# Patient Record
Sex: Male | Born: 1960 | Race: White | Hispanic: No | Marital: Married | Smoking: Never smoker
Health system: Southern US, Community
[De-identification: ages and names within clinical notes are randomized; demographics above are authoritative.]

## PROBLEM LIST (undated history)

## (undated) DIAGNOSIS — C9000 Multiple myeloma not having achieved remission: Secondary | ICD-10-CM

## (undated) DIAGNOSIS — E43 Unspecified severe protein-calorie malnutrition: Secondary | ICD-10-CM

## (undated) DIAGNOSIS — I2699 Other pulmonary embolism without acute cor pulmonale: Secondary | ICD-10-CM

## (undated) DIAGNOSIS — E859 Amyloidosis, unspecified: Secondary | ICD-10-CM

## (undated) DIAGNOSIS — I509 Heart failure, unspecified: Secondary | ICD-10-CM

## (undated) DIAGNOSIS — J90 Pleural effusion, not elsewhere classified: Secondary | ICD-10-CM

---

## 1998-03-19 ENCOUNTER — Ambulatory Visit (HOSPITAL_COMMUNITY): Admission: RE | Admit: 1998-03-19 | Discharge: 1998-03-19 | Payer: Self-pay | Admitting: Family Medicine

## 2019-04-23 ENCOUNTER — Encounter (HOSPITAL_COMMUNITY): Payer: Self-pay | Admitting: Emergency Medicine

## 2019-04-23 ENCOUNTER — Emergency Department (HOSPITAL_COMMUNITY): Payer: Managed Care, Other (non HMO)

## 2019-04-23 ENCOUNTER — Inpatient Hospital Stay (HOSPITAL_COMMUNITY)
Admission: EM | Admit: 2019-04-23 | Discharge: 2019-05-03 | DRG: 208 | Disposition: A | Discharge status: Home / Self Care | Payer: Managed Care, Other (non HMO) | Attending: Family Medicine | Admitting: Family Medicine

## 2019-04-23 ENCOUNTER — Inpatient Hospital Stay (HOSPITAL_COMMUNITY): Payer: Managed Care, Other (non HMO)

## 2019-04-23 DIAGNOSIS — E876 Hypokalemia: Secondary | ICD-10-CM | POA: Diagnosis present

## 2019-04-23 DIAGNOSIS — E43 Unspecified severe protein-calorie malnutrition: Secondary | ICD-10-CM | POA: Diagnosis not present

## 2019-04-23 DIAGNOSIS — I43 Cardiomyopathy in diseases classified elsewhere: Secondary | ICD-10-CM | POA: Diagnosis present

## 2019-04-23 DIAGNOSIS — I425 Other restrictive cardiomyopathy: Secondary | ICD-10-CM | POA: Diagnosis not present

## 2019-04-23 DIAGNOSIS — Z01811 Encounter for preprocedural respiratory examination: Secondary | ICD-10-CM

## 2019-04-23 DIAGNOSIS — E854 Organ-limited amyloidosis: Secondary | ICD-10-CM | POA: Diagnosis not present

## 2019-04-23 DIAGNOSIS — R570 Cardiogenic shock: Secondary | ICD-10-CM | POA: Diagnosis not present

## 2019-04-23 DIAGNOSIS — Z09 Encounter for follow-up examination after completed treatment for conditions other than malignant neoplasm: Secondary | ICD-10-CM

## 2019-04-23 DIAGNOSIS — I440 Atrioventricular block, first degree: Secondary | ICD-10-CM | POA: Diagnosis present

## 2019-04-23 DIAGNOSIS — I2601 Septic pulmonary embolism with acute cor pulmonale: Secondary | ICD-10-CM | POA: Diagnosis not present

## 2019-04-23 DIAGNOSIS — Z1159 Encounter for screening for other viral diseases: Secondary | ICD-10-CM | POA: Diagnosis not present

## 2019-04-23 DIAGNOSIS — I82403 Acute embolism and thrombosis of unspecified deep veins of lower extremity, bilateral: Secondary | ICD-10-CM

## 2019-04-23 DIAGNOSIS — I5033 Acute on chronic diastolic (congestive) heart failure: Secondary | ICD-10-CM

## 2019-04-23 DIAGNOSIS — I5023 Acute on chronic systolic (congestive) heart failure: Secondary | ICD-10-CM | POA: Diagnosis present

## 2019-04-23 DIAGNOSIS — I2609 Other pulmonary embolism with acute cor pulmonale: Secondary | ICD-10-CM | POA: Diagnosis not present

## 2019-04-23 DIAGNOSIS — Z9889 Other specified postprocedural states: Secondary | ICD-10-CM

## 2019-04-23 DIAGNOSIS — J9383 Other pneumothorax: Secondary | ICD-10-CM | POA: Diagnosis present

## 2019-04-23 DIAGNOSIS — E8589 Other amyloidosis: Secondary | ICD-10-CM | POA: Diagnosis present

## 2019-04-23 DIAGNOSIS — I472 Ventricular tachycardia: Secondary | ICD-10-CM | POA: Diagnosis present

## 2019-04-23 DIAGNOSIS — E873 Alkalosis: Secondary | ICD-10-CM | POA: Diagnosis not present

## 2019-04-23 DIAGNOSIS — N183 Chronic kidney disease, stage 3 (moderate): Secondary | ICD-10-CM | POA: Diagnosis not present

## 2019-04-23 DIAGNOSIS — N189 Chronic kidney disease, unspecified: Secondary | ICD-10-CM | POA: Diagnosis present

## 2019-04-23 DIAGNOSIS — R0602 Shortness of breath: Secondary | ICD-10-CM

## 2019-04-23 DIAGNOSIS — C9000 Multiple myeloma not having achieved remission: Secondary | ICD-10-CM | POA: Diagnosis present

## 2019-04-23 DIAGNOSIS — J9690 Respiratory failure, unspecified, unspecified whether with hypoxia or hypercapnia: Secondary | ICD-10-CM

## 2019-04-23 DIAGNOSIS — L8990 Pressure ulcer of unspecified site, unspecified stage: Secondary | ICD-10-CM | POA: Diagnosis not present

## 2019-04-23 DIAGNOSIS — J90 Pleural effusion, not elsewhere classified: Secondary | ICD-10-CM

## 2019-04-23 DIAGNOSIS — R64 Cachexia: Secondary | ICD-10-CM | POA: Diagnosis present

## 2019-04-23 DIAGNOSIS — I959 Hypotension, unspecified: Secondary | ICD-10-CM | POA: Diagnosis not present

## 2019-04-23 DIAGNOSIS — R069 Unspecified abnormalities of breathing: Secondary | ICD-10-CM

## 2019-04-23 DIAGNOSIS — N179 Acute kidney failure, unspecified: Secondary | ICD-10-CM | POA: Diagnosis present

## 2019-04-23 DIAGNOSIS — J9601 Acute respiratory failure with hypoxia: Secondary | ICD-10-CM

## 2019-04-23 DIAGNOSIS — I2699 Other pulmonary embolism without acute cor pulmonale: Secondary | ICD-10-CM | POA: Diagnosis present

## 2019-04-23 DIAGNOSIS — Z681 Body mass index (BMI) 19 or less, adult: Secondary | ICD-10-CM | POA: Diagnosis not present

## 2019-04-23 HISTORY — DX: Multiple myeloma not having achieved remission: C90.00

## 2019-04-23 HISTORY — DX: Other pulmonary embolism without acute cor pulmonale: I26.99

## 2019-04-23 HISTORY — DX: Unspecified severe protein-calorie malnutrition: E43

## 2019-04-23 HISTORY — DX: Pleural effusion, not elsewhere classified: J90

## 2019-04-23 HISTORY — DX: Heart failure, unspecified: I50.9

## 2019-04-23 HISTORY — DX: Amyloidosis, unspecified: E85.9

## 2019-04-23 LAB — CBC WITH DIFFERENTIAL/PLATELET
Abs Immature Granulocytes: 0.02 10*3/uL (ref 0.00–0.07)
Basophils Absolute: 0.1 10*3/uL (ref 0.0–0.1)
Basophils Relative: 2 %
Eosinophils Absolute: 0.1 10*3/uL (ref 0.0–0.5)
Eosinophils Relative: 2 %
HCT: 39.3 % (ref 39.0–52.0)
Hemoglobin: 13.8 g/dL (ref 13.0–17.0)
Immature Granulocytes: 0 %
Lymphocytes Relative: 9 %
Lymphs Abs: 0.5 10*3/uL — ABNORMAL LOW (ref 0.7–4.0)
MCH: 38.9 pg — ABNORMAL HIGH (ref 26.0–34.0)
MCHC: 35.1 g/dL (ref 30.0–36.0)
MCV: 110.7 fL — ABNORMAL HIGH (ref 80.0–100.0)
Monocytes Absolute: 0.5 10*3/uL (ref 0.1–1.0)
Monocytes Relative: 8 %
Neutro Abs: 4.5 10*3/uL (ref 1.7–7.7)
Neutrophils Relative %: 79 %
Platelets: 250 10*3/uL (ref 150–400)
RBC: 3.55 MIL/uL — ABNORMAL LOW (ref 4.22–5.81)
RDW: 15.2 % (ref 11.5–15.5)
WBC: 5.8 10*3/uL (ref 4.0–10.5)
nRBC: 0 % (ref 0.0–0.2)

## 2019-04-23 LAB — ETHANOL: Alcohol, Ethyl (B): <10 mg/dL (ref <10)

## 2019-04-23 LAB — POCT I-STAT 7, (LYTES, BLD GAS, ICA,H+H)
Acid-Base Excess: 15 mmol/L — ABNORMAL HIGH (ref 0.0–2.0)
Acid-Base Excess: 13 mmol/L — ABNORMAL HIGH (ref 0.0–2.0)
Bicarbonate: 36.1 mmol/L — ABNORMAL HIGH (ref 20.0–28.0)
Bicarbonate: 34 mmol/L — ABNORMAL HIGH (ref 20.0–28.0)
Calcium, Ion: 1.06 mmol/L — ABNORMAL LOW (ref 1.15–1.40)
Calcium, Ion: 1.14 mmol/L — ABNORMAL LOW (ref 1.15–1.40)
HCT: 33 % — ABNORMAL LOW (ref 39.0–52.0)
HCT: 34 % — ABNORMAL LOW (ref 39.0–52.0)
Hemoglobin: 11.2 g/dL — ABNORMAL LOW (ref 13.0–17.0)
Hemoglobin: 11.6 g/dL — ABNORMAL LOW (ref 13.0–17.0)
O2 Saturation: 100 %
O2 Saturation: 99 %
Patient temperature: 97.8
Potassium: 2.6 mmol/L — CL (ref 3.5–5.1)
Potassium: 3.4 mmol/L — ABNORMAL LOW (ref 3.5–5.1)
Sodium: 134 mmol/L — ABNORMAL LOW (ref 135–145)
Sodium: 132 mmol/L — ABNORMAL LOW (ref 135–145)
TCO2: 37 mmol/L — ABNORMAL HIGH (ref 22–32)
TCO2: 35 mmol/L — ABNORMAL HIGH (ref 22–32)
pCO2 arterial: 32.3 mmHg (ref 32.0–48.0)
pCO2 arterial: 29.3 mmHg — ABNORMAL LOW (ref 32.0–48.0)
pH, Arterial: 7.655 (ref 7.350–7.450)
pH, Arterial: 7.672 (ref 7.350–7.450)
pO2, Arterial: 347 mmHg — ABNORMAL HIGH (ref 83.0–108.0)
pO2, Arterial: 118 mmHg — ABNORMAL HIGH (ref 83.0–108.0)

## 2019-04-23 LAB — URINALYSIS, ROUTINE W REFLEX MICROSCOPIC
Bilirubin Urine: NEGATIVE
Glucose, UA: NEGATIVE mg/dL
Ketones, ur: NEGATIVE mg/dL
Leukocytes,Ua: NEGATIVE
Nitrite: NEGATIVE
Protein, ur: >=300 mg/dL — AB
Specific Gravity, Urine: 1.008 (ref 1.005–1.030)
pH: 8 (ref 5.0–8.0)

## 2019-04-23 LAB — COMPREHENSIVE METABOLIC PANEL
ALT: 53 U/L — ABNORMAL HIGH (ref 0–44)
AST: 44 U/L — ABNORMAL HIGH (ref 15–41)
Albumin: 2.3 g/dL — ABNORMAL LOW (ref 3.5–5.0)
Alkaline Phosphatase: 114 U/L (ref 38–126)
Anion gap: 14 (ref 5–15)
BUN: 46 mg/dL — ABNORMAL HIGH (ref 6–20)
CO2: 33 mmol/L — ABNORMAL HIGH (ref 22–32)
Calcium: 8.8 mg/dL — ABNORMAL LOW (ref 8.9–10.3)
Chloride: 89 mmol/L — ABNORMAL LOW (ref 98–111)
Creatinine, Ser: 1.28 mg/dL — ABNORMAL HIGH (ref 0.61–1.24)
GFR calc Af Amer: >60 mL/min (ref >60)
GFR calc non Af Amer: >60 mL/min (ref >60)
Glucose, Bld: 126 mg/dL — ABNORMAL HIGH (ref 70–99)
Potassium: 3.2 mmol/L — ABNORMAL LOW (ref 3.5–5.1)
Sodium: 136 mmol/L (ref 135–145)
Total Bilirubin: 0.7 mg/dL (ref 0.3–1.2)
Total Protein: 5.9 g/dL — ABNORMAL LOW (ref 6.5–8.1)

## 2019-04-23 LAB — PROTIME-INR
INR: 1 (ref 0.8–1.2)
Prothrombin Time: 13.4 seconds (ref 11.4–15.2)

## 2019-04-23 LAB — LACTIC ACID, PLASMA
Lactic Acid, Venous: 2.1 mmol/L (ref 0.5–1.9)
Lactic Acid, Venous: 4.3 mmol/L (ref 0.5–1.9)

## 2019-04-23 LAB — MAGNESIUM: Magnesium: 2.1 mg/dL (ref 1.7–2.4)

## 2019-04-23 LAB — CBG MONITORING, ED: Glucose-Capillary: 127 mg/dL — ABNORMAL HIGH (ref 70–99)

## 2019-04-23 LAB — ACETAMINOPHEN LEVEL: Acetaminophen (Tylenol), Serum: <10 ug/mL — ABNORMAL LOW (ref 10–30)

## 2019-04-23 LAB — SALICYLATE LEVEL: Salicylate Lvl: <7 mg/dL (ref 2.8–30.0)

## 2019-04-23 LAB — GLUCOSE, CAPILLARY: Glucose-Capillary: 114 mg/dL — ABNORMAL HIGH (ref 70–99)

## 2019-04-23 LAB — BRAIN NATRIURETIC PEPTIDE: B Natriuretic Peptide: 2502.3 pg/mL — ABNORMAL HIGH (ref 0.0–100.0)

## 2019-04-23 LAB — SARS CORONAVIRUS 2 BY RT PCR (HOSPITAL ORDER, PERFORMED IN ~~LOC~~ HOSPITAL LAB): SARS Coronavirus 2: NEGATIVE

## 2019-04-23 LAB — TROPONIN I: Troponin I: 0.25 ng/mL (ref <0.03)

## 2019-04-23 MED ORDER — ASPIRIN 81 MG PO CHEW: 324.0000 mg | CHEWABLE_TABLET | ORAL | Status: AC

## 2019-04-23 MED ORDER — DEXMEDETOMIDINE HCL IN NACL 200 MCG/50ML IV SOLN
0.4000 ug/kg/h | INTRAVENOUS | Status: DC
  Administered 2019-04-23: 0.4 ug/kg/h via INTRAVENOUS
  Filled 2019-04-23 (×2): qty 50

## 2019-04-23 MED ORDER — MIDAZOLAM HCL 2 MG/2ML IJ SOLN
INTRAMUSCULAR | Status: AC
  Filled 2019-04-23: qty 2

## 2019-04-23 MED ORDER — MIDAZOLAM HCL 2 MG/2ML IJ SOLN
2.0000 mg | INTRAMUSCULAR | Status: AC | PRN
  Administered 2019-04-23 (×3): 2 mg via INTRAVENOUS
  Filled 2019-04-23: qty 2

## 2019-04-23 MED ORDER — ASPIRIN 300 MG RE SUPP: 300.0000 mg | RECTAL | Status: AC

## 2019-04-23 MED ORDER — INSULIN ASPART 100 UNIT/ML ~~LOC~~ SOLN
0.0000 [IU] | SUBCUTANEOUS | Status: DC
  Administered 2019-04-25 – 2019-04-27 (×6): 2 [IU] via SUBCUTANEOUS

## 2019-04-23 MED ORDER — AMIODARONE LOAD VIA INFUSION
150.0000 mg | Freq: Once | INTRAVENOUS | Status: DC
  Filled 2019-04-23: qty 83.34

## 2019-04-23 MED ORDER — FENTANYL CITRATE (PF) 100 MCG/2ML IJ SOLN
100.0000 ug | INTRAMUSCULAR | Status: DC | PRN
  Administered 2019-04-23: 100 ug via INTRAVENOUS

## 2019-04-23 MED ORDER — MAGNESIUM SULFATE 50 % IJ SOLN: 1.0000 g | Freq: Once | INTRAMUSCULAR | Status: DC

## 2019-04-23 MED ORDER — FENTANYL CITRATE (PF) 100 MCG/2ML IJ SOLN: 100.0000 ug | Freq: Once | INTRAMUSCULAR | Status: DC

## 2019-04-23 MED ORDER — AMIODARONE HCL IN DEXTROSE 360-4.14 MG/200ML-% IV SOLN
60.0000 mg/h | INTRAVENOUS | Status: DC
  Administered 2019-04-23 (×2): 60 mg/h via INTRAVENOUS
  Filled 2019-04-23 (×2): qty 200

## 2019-04-23 MED ORDER — MAGNESIUM SULFATE IN D5W 1-5 GM/100ML-% IV SOLN
1.0000 g | Freq: Once | INTRAVENOUS | Status: AC
  Administered 2019-04-23: 1 g via INTRAVENOUS
  Filled 2019-04-23: qty 100

## 2019-04-23 MED ORDER — POTASSIUM CHLORIDE 10 MEQ/100ML IV SOLN
10.0000 meq | INTRAVENOUS | Status: AC
  Administered 2019-04-23 (×2): 10 meq via INTRAVENOUS
  Filled 2019-04-23: qty 100

## 2019-04-23 MED ORDER — ETOMIDATE 2 MG/ML IV SOLN
INTRAVENOUS | Status: AC | PRN
  Administered 2019-04-23: 20 mg via INTRAVENOUS

## 2019-04-23 MED ORDER — DEXMEDETOMIDINE HCL IN NACL 400 MCG/100ML IV SOLN: 0.4000 ug/kg/h | INTRAVENOUS | Status: DC

## 2019-04-23 MED ORDER — ROCURONIUM BROMIDE 50 MG/5ML IV SOLN
INTRAVENOUS | Status: AC | PRN
  Administered 2019-04-23: 100 mg via INTRAVENOUS

## 2019-04-23 MED ORDER — FAMOTIDINE IN NACL 20-0.9 MG/50ML-% IV SOLN
20.0000 mg | Freq: Two times a day (BID) | INTRAVENOUS | Status: DC
  Administered 2019-04-24 – 2019-04-26 (×7): 20 mg via INTRAVENOUS
  Filled 2019-04-23 (×8): qty 50

## 2019-04-23 MED ORDER — SODIUM CHLORIDE 0.9 % IV SOLN
1.0000 g | Freq: Once | INTRAVENOUS | Status: AC
  Administered 2019-04-23: 1 g via INTRAVENOUS
  Filled 2019-04-23: qty 10

## 2019-04-23 MED ORDER — SODIUM CHLORIDE 0.9 % IV SOLN
INTRAVENOUS | Status: AC | PRN
  Administered 2019-04-23: 1000 mL via INTRAVENOUS

## 2019-04-23 MED ORDER — NOREPINEPHRINE 4 MG/250ML-% IV SOLN
0.0000 ug/min | INTRAVENOUS | Status: DC
  Administered 2019-04-23: 22:00:00 8 ug/min via INTRAVENOUS
  Administered 2019-04-23: 2 ug/min via INTRAVENOUS
  Administered 2019-04-24 (×2): 7 ug/min via INTRAVENOUS
  Administered 2019-04-25: 03:00:00 6 ug/min via INTRAVENOUS
  Filled 2019-04-23 (×4): qty 250

## 2019-04-23 MED ORDER — AMIODARONE HCL IN DEXTROSE 360-4.14 MG/200ML-% IV SOLN
30.0000 mg/h | INTRAVENOUS | Status: DC
  Administered 2019-04-23 – 2019-04-25 (×4): 30 mg/h via INTRAVENOUS
  Filled 2019-04-23 (×3): qty 200

## 2019-04-23 MED ORDER — HEPARIN (PORCINE) 25000 UT/250ML-% IV SOLN
1200.0000 [IU]/h | INTRAVENOUS | Status: DC
  Administered 2019-04-23 – 2019-04-25 (×3): 1150 [IU]/h via INTRAVENOUS
  Filled 2019-04-23 (×4): qty 250

## 2019-04-23 MED ORDER — IOHEXOL 300 MG/ML  SOLN
75.0000 mL | Freq: Once | INTRAMUSCULAR | Status: AC | PRN
  Administered 2019-04-23: 75 mL via INTRAVENOUS

## 2019-04-23 MED ORDER — FENTANYL CITRATE (PF) 100 MCG/2ML IJ SOLN
INTRAMUSCULAR | Status: AC
  Filled 2019-04-23: qty 2

## 2019-04-23 MED ORDER — POTASSIUM CHLORIDE 20 MEQ PO PACK
40.0000 meq | PACK | Freq: Once | ORAL | Status: AC
  Administered 2019-04-23: 40 meq
  Filled 2019-04-23: qty 2

## 2019-04-23 MED ORDER — DEXTROSE 5 % IV SOLN
INTRAVENOUS | Status: AC | PRN
  Administered 2019-04-23: 150 mg via INTRAVENOUS

## 2019-04-23 MED ORDER — FENTANYL 2500MCG IN NS 250ML (10MCG/ML) PREMIX INFUSION
0.0000 ug/h | INTRAVENOUS | Status: DC
  Administered 2019-04-23 (×2): 100 ug/h via INTRAVENOUS
  Administered 2019-04-25: 150 ug/h via INTRAVENOUS
  Filled 2019-04-23 (×3): qty 250

## 2019-04-23 MED ORDER — MIDAZOLAM HCL 2 MG/2ML IJ SOLN
2.0000 mg | INTRAMUSCULAR | Status: DC | PRN
  Administered 2019-04-23 – 2019-04-24 (×5): 2 mg via INTRAVENOUS
  Filled 2019-04-23 (×5): qty 2

## 2019-04-23 MED ORDER — FUROSEMIDE 10 MG/ML IJ SOLN
80.0000 mg | Freq: Once | INTRAMUSCULAR | Status: AC
  Administered 2019-04-23: 80 mg via INTRAVENOUS
  Filled 2019-04-23: qty 8

## 2019-04-23 MED ORDER — SODIUM CHLORIDE 0.9 % IV SOLN
INTRAVENOUS | Status: DC
  Administered 2019-04-23: via INTRAVENOUS

## 2019-04-23 MED ORDER — HEPARIN BOLUS VIA INFUSION
5000.0000 [IU] | Freq: Once | INTRAVENOUS | Status: AC
  Administered 2019-04-23: 5000 [IU] via INTRAVENOUS
  Filled 2019-04-23: qty 5000

## 2019-04-23 NOTE — ED Provider Notes (Signed)
[facility] EMERGENCY DEPARTMENT Provider Note   CSN: [id number] Arrival date & time: 04/23/19  1521    History   Chief Complaint Chief Complaint  Patient presents with  . Respiratory Arrest    HPI Erik Costa is a 58 y.o. male.     The history is provided by the patient, the spouse and medical records.  Altered Mental Status  Presenting symptoms: unresponsiveness   Severity:  Severe Most recent episode:  Today Episode history:  Single Duration:  30 minutes Timing:  Constant Progression:  Unchanged Chronicity:  New Context: not dementia, not drug use, taking medications as prescribed, not recent change in medication and not recent infection   Associated symptoms: difficulty breathing, palpitations and weakness   Associated symptoms: no abdominal pain, normal movement, no agitation, no fever, no nausea, no seizures, no slurred speech, no visual change and no vomiting     History reviewed. No pertinent past medical history.  There are no active problems to display for this patient.   History reviewed. No pertinent surgical history.      Home Medications    Prior to Admission medications   Not on File    Family History No family history on file.  Social History Social History   Tobacco Use  . Smoking status: Not on file  Substance Use Topics  . Alcohol use: Not on file  . Drug use: Not on file     Allergies   Patient has no known allergies.   Review of Systems Review of Systems  Constitutional: Negative for fever.  Cardiovascular: Positive for palpitations.  Gastrointestinal: Negative for abdominal pain, nausea and vomiting.  Neurological: Positive for weakness. Negative for seizures.  Psychiatric/Behavioral: Negative for agitation.  All other systems reviewed and are negative.    Physical Exam Updated Vital Signs BP 97/75   Pulse 76   Temp (!) 97.5 F (36.4 C)   Resp 14   Ht 6' (1.829 m)   Wt 70.6 kg   SpO2  100%   BMI 21.11 kg/m   Physical Exam Vitals signs and nursing note reviewed.  Constitutional:      General: He is in acute distress.     Appearance: He is well-developed. He is toxic-appearing.  HENT:     Head: Normocephalic and atraumatic.  Eyes:     Conjunctiva/sclera: Conjunctivae normal.     Pupils: Pupils are equal, round, and reactive to light.     Comments: Pupils 4 mm, reactive bilaterally  Neck:     Musculoskeletal: Normal range of motion and neck supple.  Cardiovascular:     Rate and Rhythm: Tachycardia present. Rhythm irregular.  Pulmonary:     Breath sounds: Rhonchi present.     Comments: Agonal respirations Abdominal:     Palpations: Abdomen is soft.  Skin:    General: Skin is warm and dry.  Neurological:     Comments: GCS 3, grossly obtunded   Able to complete full neuro exam secondary to GCS      ED Treatments / Results  Labs (all labs ordered are listed, but only abnormal results are displayed) Labs Reviewed  COMPREHENSIVE METABOLIC PANEL - Abnormal; Notable for the following components:      Result Value   Potassium 3.2 (*)    Chloride 89 (*)    CO2 33 (*)    Glucose, Bld 126 (*)    BUN 46 (*)    Creatinine, Ser 1.28 (*)    Calcium 8.8 (*)  Total Protein 5.9 (*)    Albumin 2.3 (*)    AST 44 (*)    ALT 53 (*)    All other components within normal limits  ACETAMINOPHEN LEVEL - Abnormal; Notable for the following components:   Acetaminophen (Tylenol), Serum <10 (*)    All other components within normal limits  TROPONIN I - Abnormal; Notable for the following components:   Troponin I 0.25 (*)    All other components within normal limits  CBC WITH DIFFERENTIAL/PLATELET - Abnormal; Notable for the following components:   RBC 3.55 (*)    MCV 110.7 (*)    MCH 38.9 (*)    Lymphs Abs 0.5 (*)    All other components within normal limits  LACTIC ACID, PLASMA - Abnormal; Notable for the following components:   Lactic Acid, Venous 2.1 (*)     All other components within normal limits  BRAIN NATRIURETIC PEPTIDE - Abnormal; Notable for the following components:   B Natriuretic Peptide 2,502.3 (*)    All other components within normal limits  URINALYSIS, ROUTINE W REFLEX MICROSCOPIC - Abnormal; Notable for the following components:   Hgb urine dipstick SMALL (*)    Protein, ur >=300 (*)    Bacteria, UA RARE (*)    All other components within normal limits  CBG MONITORING, ED - Abnormal; Notable for the following components:   Glucose-Capillary 127 (*)    All other components within normal limits  POCT I-STAT 7, (LYTES, BLD GAS, ICA,H+H) - Abnormal; Notable for the following components:   pH, Arterial 7.655 (*)    pO2, Arterial 347.0 (*)    Bicarbonate 36.1 (*)    TCO2 37 (*)    Acid-Base Excess 15.0 (*)    Sodium 134 (*)    Potassium 2.6 (*)    Calcium, Ion 1.06 (*)    HCT 33.0 (*)    Hemoglobin 11.2 (*)    All other components within normal limits  SARS CORONAVIRUS 2 (HOSPITAL ORDER, [facility])  URINE CULTURE  CULTURE, BLOOD (ROUTINE X 2)  CULTURE, BLOOD (ROUTINE X 2)  ETHANOL  SALICYLATE LEVEL  PROTIME-INR  MAGNESIUM  LACTIC ACID, PLASMA  HEPARIN LEVEL (UNFRACTIONATED)  CBC  I-STAT ARTERIAL BLOOD GAS, ED    EKG None  Radiology Ct Head Wo Contrast  Result Date: 04/23/2019 CLINICAL DATA:  Shortness of breath, became unresponsive on the way home from heart failure clinic. EXAM: CT HEAD WITHOUT CONTRAST TECHNIQUE: Contiguous axial images were obtained from the base of the skull through the vertex without intravenous contrast. COMPARISON:  None. FINDINGS: Brain: The brainstem, cerebellum, cerebral peduncles, thalami, basal ganglia, basilar cisterns, and ventricular system appear within normal limits. No intracranial hemorrhage, mass lesion, or acute CVA. Vascular: Unremarkable Skull: Left mastoidectomy.  Gas tracks along the left facial Sinuses/Orbits: Left mastoidectomy. The remaining  paranasal sinuses appear clear. Other: There is gas tracking along the left and to a lesser extent pterygoid musculature along the left masseter muscle and lateral to the left mandibular condyle. Air fluid level in the nasopharynx. Patient is orally intubated. IMPRESSION: 1. No acute intracranial findings. 2. There is gas tracking along the margins the left pterygoid muscle and along the posterior margin of the left masseter muscle, cause uncertain, but conceivably related to intubation. I do not see adjacent facial fractures. Patient does have a left mastoidectomy. Electronically Signed   By: Van Clines M.D.   On: 04/23/2019 18:56   Ct Chest W Contrast  Addendum Date: 04/23/2019   ADDENDUM REPORT: 04/23/2019 19:10 ADDENDUM: The original report was by Dr. Van Clines. The following addendum is by Dr. Van Clines: Critical Value/emergent results were called by telephone at the time of interpretation on 04/23/2019 at 7:10 pm to Dr. Laverta Baltimore, who verbally acknowledged these results. Electronically Signed   By: Van Clines M.D.   On: 04/23/2019 19:10   Result Date: 04/23/2019 CLINICAL DATA:  Shortness of breath, patient became unresponsive after leaving heart failure clinic. EXAM: CT CHEST WITH CONTRAST TECHNIQUE: Multidetector CT imaging of the chest was performed during intravenous contrast administration. CONTRAST:  25m OMNIPAQUE IOHEXOL 300 MG/ML  SOLN COMPARISON:  04/23/2019 chest radiograph FINDINGS: Cardiovascular: Mild cardiomegaly. Left ventricular hypertrophy with the interventricular septal thickness of 2.2 cm. Today's exam was not performed as a CT angiogram. Nevertheless, there is discernible filling defect compatible with thrombus in the left upper lobe pulmonary artery into a lesser extent in the right lower lobe pulmonary artery as shown for example on images 79 through 87 of series 3. Clot burden is small to moderate. Mediastinum/Nodes: Diffuse edema in the mediastinal adipose  tissues. Endotracheal tube satisfactorily positioned. Nasogastric tube enters the stomach. Lungs/Pleura: Large bilateral pleural effusions, over 50% of hemithoracic volume bilaterally. Secondary pulmonary lobular interstitial accentuation especially in the lung apices compatible with pulmonary edema. Extensive passive atelectasis. Upper Abdomen: Edema in the upper abdominal adipose tissue compatible with widespread third spacing. Musculoskeletal: Diffuse subcutaneous edema compatible with widespread third spacing of fluid. Age indeterminate superior endplate wedging at TX41 Vacuum disc phenomenon at T9-10. IMPRESSION: 1. Acute pulmonary embolus involving the right upper lobe and right lower lobe. Clot burden is small to moderate. Positive for acute PE with CT evidence of right heart strain (RV/LV Ratio = 1.1) consistent with at least submassive (intermediate risk) PE. The presence of right heart strain has been associated with an increased risk of morbidity and mortality. Please activate Code PE by paging [phone] 2. Left ventricular hypertrophy and mild cardiomegaly. 3. Large bilateral pleural effusions with passive atelectasis. Only a minority of the lung is aerated. This may reduce overall respiratory reserve. 4. Third spacing of fluid with edema in mediastinal and subcutaneous tissues. Secondary pulmonary lobular interstitial accentuation in the lungs compatible with interstitial pulmonary edema. 5. Age-indeterminate mild superior endplate compression at T10. Radiology assistant personnel have been notified to put me in telephone contact with the referring physician or the referring physician's clinical representative in order to discuss these findings. Once this communication is established I will issue an addendum to this report for documentation purposes. Electronically Signed: By: WVan ClinesM.D. On: 04/23/2019 19:07   Dg Chest Portable 1 View  Result Date: 04/23/2019 CLINICAL DATA:  Hypoxia  EXAM: PORTABLE CHEST 1 VIEW COMPARISON:  None. FINDINGS: Endotracheal tube tip is 6.2 cm above the carina. Nasogastric tube tip and side port are below the diaphragm. No evident pneumothorax. There are pleural effusions bilaterally. There is consolidation in the left base. There is mild interstitial edema throughout the lungs bilaterally. Heart is upper normal in size. The pulmonary vascularity is normal. No adenopathy appreciable. No bone lesions. IMPRESSION: Tube positions as described without pneumothorax. Layering pleural effusion on the right. Apparent consolidation with effusion left base. Pneumonia and/or aspiration suspected in left base. There is interstitial prominence throughout the lungs which may represent noncardiogenic edema or possibly atypical infection. Heart upper normal in size.  No adenopathy appreciable. Electronically Signed   By: WLowella GripIII M.D.   On:  04/23/2019 16:04    Procedures Procedure Name: Intubation Date/Time: 04/23/2019 3:40 PM Performed by: Lonzo Candy, MD Pre-anesthesia Checklist: Patient identified, Emergency Drugs available, Suction available and Patient being monitored Oxygen Delivery Method: Ambu bag Preoxygenation: Pre-oxygenation with 100% oxygen Induction Type: Rapid sequence Ventilation: Mask ventilation without difficulty Laryngoscope Size: Mac and 4 Grade View: Grade I Tube size: 7.5 mm Number of attempts: 1 Airway Equipment and Method: Stylet and Video-laryngoscopy Placement Confirmation: ETT inserted through vocal cords under direct vision,  CO2 detector and Breath sounds checked- equal and bilateral Secured at: 24 cm Tube secured with: ETT holder      (including critical care time)  Medications Ordered in ED Medications  amiodarone (CORDARONE) 150 mg in dextrose 5 % 100 mL bolus (150 mg Intravenous New Bag/Given 04/23/19 1522)  amiodarone (NEXTERONE) 1.8 mg/mL load via infusion 150 mg (150 mg Intravenous Not Given 04/23/19 1553)     Followed by  amiodarone (NEXTERONE PREMIX) 360-4.14 MG/200ML-% (1.8 mg/mL) IV infusion (60 mg/hr Intravenous New Bag/Given 04/23/19 1530)    Followed by  amiodarone (NEXTERONE PREMIX) 360-4.14 MG/200ML-% (1.8 mg/mL) IV infusion (has no administration in time range)  etomidate (AMIDATE) injection (20 mg Intravenous Given 04/23/19 1524)  rocuronium (ZEMURON) injection (100 mg Intravenous Given 04/23/19 1525)  0.9 %  sodium chloride infusion (1,000 mLs Intravenous New Bag/Given 04/23/19 1527)  fentaNYL (SUBLIMAZE) injection 100 mcg (100 mcg Intravenous Given 04/23/19 1705)  fentaNYL (SUBLIMAZE) injection 100 mcg (100 mcg Intravenous Given 04/23/19 1856)  midazolam (VERSED) injection 2 mg (2 mg Intravenous Given 04/23/19 1856)  midazolam (VERSED) injection 2 mg (has no administration in time range)  potassium chloride 10 mEq in 100 mL IVPB (0 mEq Intravenous Stopped 04/23/19 1934)  magnesium sulfate IVPB 1 g 100 mL (has no administration in time range)  midazolam (VERSED) 2 MG/2ML injection (has no administration in time range)  fentaNYL (SUBLIMAZE) 100 MCG/2ML injection (has no administration in time range)  norepinephrine (LEVOPHED) 47m in 2551mpremix infusion (8 mcg/min Intravenous Rate/Dose Change 04/23/19 1856)  midazolam (VERSED) 2 MG/2ML injection (has no administration in time range)  dexmedetomidine (PRECEDEX) 200 MCG/50ML (4 mcg/mL) infusion (0.6 mcg/kg/hr  70.6 kg Intravenous Rate/Dose Change 04/23/19 1856)  fentaNYL (SUBLIMAZE) injection 100 mcg (0 mcg Intravenous Hold 04/23/19 1837)  fentaNYL (SUBLIMAZE) 100 MCG/2ML injection (has no administration in time range)  midazolam (VERSED) 2 MG/2ML injection (has no administration in time range)  furosemide (LASIX) injection 80 mg (has no administration in time range)  heparin ADULT infusion 100 units/mL (25000 units/25047modium chloride 0.45%) (has no administration in time range)  heparin bolus via infusion 5,000 Units (has no administration in time range)   fentaNYL 2500m24mn NS 250mL22mmcg57m infusion-PREMIX (has no administration in time range)  calcium gluconate 1 g in sodium chloride 0.9 % 100 mL IVPB (0 g Intravenous Stopped 04/23/19 1813)  potassium chloride (KLOR-CON) packet 40 mEq (40 mEq Per Tube Given 04/23/19 1645)  iohexol (OMNIPAQUE) 300 MG/ML solution 75 mL (75 mLs Intravenous Contrast Given 04/23/19 1804)     Initial Impression / Assessment and Plan / ED Course  I have reviewed the triage vital signs and the nursing notes.  Pertinent labs & imaging results that were available during my care of the patient were reviewed by me and considered in my medical decision making (see chart for details).        Medical Decision Making: FrederDoroteo Nickolson57 y.o15male who presented to the ED today unresponsive.  Past  medical history significant for MGUS, advanced amyloidosis, cardiac, pulmonary, renal involvement, follows with advanced heart failure clinic, hematology oncology [place] and confirmed nursing documentation for past medical history, family history, social history.  On my initial exam, the pt was toxic appearing, unresponsive, agonal respirations, tachycardic irregular rhythm, hypotensive. Patient intubated for airway protection, respiratory failure, please see procedure notes for details. Initial EKG showed nonsustained V. Tach, supraventricular tachycardia, rate 168 Amnio bolus and drip started.  Improvement of rate and rhythm  Discussed case with wife, normal course of affairs the last few days, no infectious review of systems, went to advanced heart failure clinic visit today, good report from doctors per wife, on the way home almost home patient became unresponsive with difficulty breathing.  Wife drove patient straight here.  No obvious abnormal shaking concerning for seizures, no recent head trauma or any trauma systemically.  Patient has been compliant on outpatient medications.  No recent chest pain.  No  recent new life stressors per wife. Etiology difficult to determine at this stage will obtain more objective data, could be secondary to cardiac or renal failure, effusion, lung or cardiac, less likely intracranial lesion, seizure, infection given history per wife   Patient intermittently hypotensive, peripheral Levophed drip started pH 7.65, lactic acid 2.1, BNP 2500, potassium 3.2, creatinine 1.3, troponin 0.25 , no significant leukocytosis or anemia, electrolytes repleted CT head negative CT chest shows acute PE right upper lobe, clot burden small to moderate, large bilateral pleural effusion with atelectasis, third spacing fluid in the mediastinum  Heparin GTT started Discussed with advanced heart failure team at [facility], unfortunately no ICU beds available, will admit to ICU here given no ICU beds available for transfer ICU team paged and signout given all radiology and laboratory studies reviewed independently and with my attending physician, agree with reading provided by radiologist unless otherwise noted.   Based on the above findings, I believe patient requires admission. Pt admitted.  The above care was discussed with and agreed upon by my attending physician. Emergency Department Medication Summary:  Medications  amiodarone (CORDARONE) 150 mg in dextrose 5 % 100 mL bolus (150 mg Intravenous New Bag/Given 04/23/19 1522)  amiodarone (NEXTERONE) 1.8 mg/mL load via infusion 150 mg (150 mg Intravenous Not Given 04/23/19 1553)    Followed by  amiodarone (NEXTERONE PREMIX) 360-4.14 MG/200ML-% (1.8 mg/mL) IV infusion (60 mg/hr Intravenous New Bag/Given 04/23/19 1530)    Followed by  amiodarone (NEXTERONE PREMIX) 360-4.14 MG/200ML-% (1.8 mg/mL) IV infusion (has no administration in time range)  etomidate (AMIDATE) injection (20 mg Intravenous Given 04/23/19 1524)  rocuronium (ZEMURON) injection (100 mg Intravenous Given 04/23/19 1525)  0.9 %  sodium chloride infusion (1,000 mLs Intravenous  New Bag/Given 04/23/19 1527)  fentaNYL (SUBLIMAZE) injection 100 mcg (100 mcg Intravenous Given 04/23/19 1705)  fentaNYL (SUBLIMAZE) injection 100 mcg (100 mcg Intravenous Given 04/23/19 1856)  midazolam (VERSED) injection 2 mg (2 mg Intravenous Given 04/23/19 1856)  midazolam (VERSED) injection 2 mg (has no administration in time range)  potassium chloride 10 mEq in 100 mL IVPB (0 mEq Intravenous Stopped 04/23/19 1934)  magnesium sulfate IVPB 1 g 100 mL (has no administration in time range)  midazolam (VERSED) 2 MG/2ML injection (has no administration in time range)  fentaNYL (SUBLIMAZE) 100 MCG/2ML injection (has no administration in time range)  norepinephrine (LEVOPHED) 29m in 2543mpremix infusion (8 mcg/min Intravenous Rate/Dose Change 04/23/19 1856)  midazolam (VERSED) 2 MG/2ML injection (has no administration in time range)  dexmedetomidine (  PRECEDEX) 200 MCG/50ML (4 mcg/mL) infusion (0.6 mcg/kg/hr  70.6 kg Intravenous Rate/Dose Change 04/23/19 1856)  fentaNYL (SUBLIMAZE) injection 100 mcg (0 mcg Intravenous Hold 04/23/19 1837)  fentaNYL (SUBLIMAZE) 100 MCG/2ML injection (has no administration in time range)  midazolam (VERSED) 2 MG/2ML injection (has no administration in time range)  furosemide (LASIX) injection 80 mg (has no administration in time range)  heparin ADULT infusion 100 units/mL (25000 units/252m sodium chloride 0.45%) (has no administration in time range)  heparin bolus via infusion 5,000 Units (has no administration in time range)  fentaNYL 25054m in NS 25031m89m9ml) infusion-PREMIX (has no administration in time range)  calcium gluconate 1 g in sodium chloride 0.9 % 100 mL IVPB (0 g Intravenous Stopped 04/23/19 1813)  potassium chloride (KLOR-CON) packet 40 mEq (40 mEq Per Tube Given 04/23/19 1645)  iohexol (OMNIPAQUE) 300 MG/ML solution 75 mL (75 mLs Intravenous Contrast Given 04/23/19 1804)    Final Clinical Impressions(s) / ED Diagnoses   Final diagnoses:  Acute respiratory  failure with hypoxia ([facility]    None       CaseLonzo Candy 04/23/19 1952Luiz Iron 04/24/19 [id number]

## 2019-04-23 NOTE — Progress Notes (Signed)
Transported pt to CT scan and back to Trauma B without incident.

## 2019-04-23 NOTE — ED Notes (Signed)
Family at bedside. MD and charge RN agreed to let daughter to bedside.

## 2019-04-23 NOTE — ED Notes (Signed)
RT obtained ABG

## 2019-04-23 NOTE — Procedures (Signed)
Central Venous Catheter Insertion Procedure Note Erik Costa [id number] [birth year]  Procedure: Insertion of Central Venous Catheter Indications: Drug and/or fluid administration  Procedure Details Consent: Risks of procedure as well as the alternatives and risks of each were explained to the (patient/caregiver).  Consent for procedure obtained. Time Out: Verified patient identification, verified procedure, site/side was marked, verified correct patient position, special equipment/implants available, medications/allergies/relevent history reviewed, required imaging and test results available.  Performed  Maximum sterile technique was used including antiseptics, cap, gloves, gown, hand hygiene, mask and sheet. Skin prep: Chlorhexidine; local anesthetic administered A antimicrobial bonded/coated triple lumen catheter was placed in the left subclavian vein using the Seldinger technique.  Evaluation Blood flow good Complications: No apparent complications Patient did tolerate procedure well. Chest X-ray ordered to verify placement.  CXR: pending.  Shellia Cleverly 04/23/2019, 8:52 PM

## 2019-04-23 NOTE — ED Notes (Signed)
Critical Care MD at bedside for CVC insertion

## 2019-04-23 NOTE — Progress Notes (Signed)
ANTICOAGULATION CONSULT NOTE - Initial Consult  Pharmacy Consult for heparin Indication: pulmonary embolus  No Known Allergies  Patient Measurements: Height: 6' (182.9 cm) Weight: 155 lb 10.3 oz (70.6 kg) IBW/kg (Calculated) : 77.6  Heparin dosing weight: 70.6kg  Vital Signs: Temp: 97.4 F (36.3 C) (06/08 1845) BP: 85/64 (06/08 1845) Pulse Rate: 78 (06/08 1845)  Labs: Recent Labs    04/23/19 1535 04/23/19 1604  HGB 13.8 11.2*  HCT 39.3 33.0*  PLT 250  --   LABPROT 13.4  --   INR 1.0  --   CREATININE 1.28*  --   TROPONINI 0.25*  --     Estimated Creatinine Clearance: 62.1 mL/min (A) (by C-G formula based on SCr of 1.28 mg/dL (H)).   Medical History: History reviewed. No pertinent past medical history.  Assessment: Erik Costa is a 58yo male admitted with pulmonary embolism. Pharmacy consulted to start heparin infusion. Chest CT: Acute pulmonary embolus involving RUL and RLL. Clot burden is small to moderate. Positive for acute PE with CT evidence of right heart strain. RV/LV Ratio = 1.1. Reviewed outpatient notes from [facility] and patient not on prior anticoagulation. Hgb 11.2 and pltc 250.  Goal of Therapy:  Heparin level 0.3-0.7 units/ml Monitor platelets by anticoagulation protocol: Yes   Plan:  Heparin bolus 5000 units x1 Start heparin infusion at 1150 units/hr Heparin level at 0300 Monitor daily heparin level, CBC, s/sx of bleeding  Thank you for involving pharmacy in this patient's care.  Janae Bridgeman, PharmD PGY1 Pharmacy Resident Phone: [phone] 04/23/2019 7:15 PM

## 2019-04-23 NOTE — ED Notes (Signed)
ED Provider at bedside with family. 

## 2019-04-23 NOTE — ED Notes (Signed)
Called CT to inquire if we could come over for CT for scans- per CT tech can only come to CT scanner 3 and is not open at this time. MD aware will continue to follow up.

## 2019-04-23 NOTE — ED Notes (Signed)
Central line CXR reviewed by Dr. Mariane Masters, permission given to being using lines

## 2019-04-23 NOTE — ED Notes (Signed)
ED Provider at bedside. 

## 2019-04-23 NOTE — H&P (Signed)
NAME:  Erik Costa, MRN:  [id number], DOB:  30-Sep-1961, LOS: 0 ADMISSION DATE:  04/23/2019, CONSULTATION DATE: 04/23/2019 REFERRING MD:  ER, CHIEF COMPLAINT: Sudden syncope  Brief History   Patient is a 58 year old with systemic amyloidosis treated at [facility] to the emergency room today with sudden collapse found to have pulmonary embolus on CT scan of the chest.  History of present illness   She is a 58 year old diagnosed in December of last year with amyloidosis.  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  He is followed at [facility].  He is treated with cyclophosphamide, Velcade, dexamethasone.  He was coming home from a cardiology appointment.  He has been told that he does have significant amyloid involvement in his heart.  He previously has had very large according to his wife 5 L pleural effusions that have required thoracentesis and was told this was due to amyloid involvement of his heart.  She says his ejection fraction is 50% presumably with reduced left ventricular chamber size and significant systolic dysfunction.  CT scan of the chest showed small to moderate clot burden in the left lung.  He did have documented runs of ventricular tachycardia on arrival and required intubation due to hypoxemia and arrhythmias.  He currently is on amiodarone receiving fentanyl with Precedex being titrated off intermittent Versed and heparin drip is about to be started.  I discussed with his wife at length the possibility of using TPA due to the fact that he is on Levophed at 8 mics for hypotension.  I believe his hypotension is in part due to his systolic dysfunction in combination with his PE and with his systemic amyloidosis I do not know with the increased risk of significant or active complications might be.  Because of this we decided to withhold TPA and use high-dose heparin.  His blood pressure currently is about 100/60 on the 8  mics of Levophed.  He also is on amiodarone drip. His wife does tell me that he has had significant issues with worsening stamina over the last month or 2 decreased ability to get around presumably due to progressive amyloid.  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 rate 14, height 6' (1.829 m), weight 70.6 kg, SpO2 100 %.    Vent Mode: PRVC FiO2 (%):  [50 %-100 %] 50 % Set Rate:  [14 bmp-16 bmp] 14 bmp Vt Set:  [620 mL] 620 mL PEEP:  [5 cmH20] 5 cmH20 Plateau Pressure:  [20 cmH20] 20 cmH20   Intake/Output Summary (Last 24 hours) at 04/23/2019 2011 Last data filed at 04/23/2019 1813 Gross per 24 hour  Intake 200 ml  Output --  Net 200 ml   Filed Weights   04/23/19 1730  Weight: 70.6 kg    Examination: General: thin wm  HENT: wnl, petechiae over neck ches and eyelids Lungs: clear Cardiovascular: rrr Abdomen: scaphoid benign Extremities: 2-3 peripheral edema Neuro: sedated GU: nl  Resolved Hospital Problem list   na  Assessment & Plan:  1.  Pulmonary embolus: It was unknown risk of potential bleeding due to his systemic amyloidosis we will get a treat with full dose heparin without TPA continue mechanical ventilation as necessary  2.  Amyloidosis: Currently will withhold cyclophosphamide.  Will cover possible hypotension and absence of dexamethasone with Decadron.   3.  Hypotension will slowly expand vascular space with normal saline and wean Levophed as tolerated  4.  Amyloidosis: This seems  relatively rapidly progressive despite aggressive therapy.  Further palliative care counseling may be necessary depending on the patient's clinical course  5.  Respiratory failure: Per protocol  6.  Chronic kidney injury: Followed at [facility] for same.  Monitor  Best practice:  Diet: npo Pain/Anxiety/Delirium protocol (if indicated): continuous fentanyl VAP protocol (if indicated): yes DVT prophylaxis: heparin GI prophylaxis: pepcid Glucose control: monitor Mobility: bedrest Code Status: full Family Communication: wife Disposition: icu  Labs   CBC: Recent Labs  Lab 04/23/19 1535 04/23/19 1604  WBC 5.8  --   NEUTROABS 4.5  --   HGB 13.8 11.2*  HCT 39.3 33.0*  MCV 110.7*  --   PLT 250  --     Basic Metabolic Panel: Recent Labs  Lab 04/23/19 1535 04/23/19 1604 04/23/19 1633  NA 136 134*  --   K 3.2* 2.6*  --   CL 89*  --   --   CO2 33*  --   --   GLUCOSE 126*  --   --   BUN 46*  --   --   CREATININE 1.28*  --   --   CALCIUM 8.8*  --   --   MG  --   --  2.1   GFR: Estimated Creatinine Clearance: 62.1 mL/min (A) (by C-G formula based on SCr of 1.28 mg/dL (H)). Recent Labs  Lab 04/23/19 1535 04/23/19 1537  WBC 5.8  --   LATICACIDVEN  --  2.1*    Liver Function Tests: Recent Labs  Lab 04/23/19 1535  AST 44*  ALT 53*  ALKPHOS 114  BILITOT 0.7  PROT 5.9*  ALBUMIN 2.3*   No results for input(s): LIPASE, AMYLASE in the last 168 hours. No results for input(s): AMMONIA in the last 168 hours.  ABG    Component Value Date/Time   PHART 7.655 (HH) 04/23/2019 1604   PCO2ART 32.3 04/23/2019 1604   PO2ART 347.0 (H) 04/23/2019 1604   HCO3 36.1 (H) 04/23/2019 1604   TCO2 37 (H) 04/23/2019 1604   O2SAT 100.0 04/23/2019 1604     Coagulation Profile: Recent Labs  Lab 04/23/19 1535  INR 1.0    Cardiac Enzymes: Recent Labs  Lab 04/23/19 1535  TROPONINI 0.25*    HbA1C: No results found for: HGBA1C  CBG: Recent Labs  Lab 04/23/19 1521   GLUCAP 127*    Review of Systems:   As above  Past Medical History  He,  has no past medical history on file.   Surgical History   History reviewed. No pertinent surgical history.   Social History      Family History   His family history is not on file.   Allergies No Known Allergies   Home Medications  Prior to Admission medications   Not on File     Critical care time: 45 minutes spent in evaluation and critical care planning

## 2019-04-23 NOTE — Progress Notes (Signed)
Chaplain responded to page from ED, Pt in Trauma B. Non-responsive.  Pt's wife waiting in Consult B.  Chaplain encountered wife in Consult B with doctor, explaining next steps. Pt's wife said they were coming home in car from heart doctor's appointment where he was told "see you in six months" when he became non-responsive in car.  Chaplain provided ministry of presence and prayer. Will follow. Viola Pager [phone]

## 2019-04-23 NOTE — ED Notes (Signed)
Awaiting for labs for ct w contrast- CT states will call when scanner ready

## 2019-04-23 NOTE — ED Notes (Addendum)
ED TO INPATIENT HANDOFF REPORT  ED Nurse Name and Phone #: Tray [place], [phone]  S Name/Age/Gender Erik Costa 58 y.o. male Room/Bed: TRABC/TRABC  Code Status   Code Status: Full Code  Home/SNF/Other Home Patient oriented to: self, place, time and situation Is this baseline? Yes   Triage Complete: Triage complete  Chief Complaint Unresponsive  Triage Note Pt arrives in passenger seat with family after becoming unresponsive on the way home from the heart failure clinic. Pt has agonal respirations with weak pulses on arrival to trauma bay being bagged by sort nurse- pt placed on zoll and preparing to intubate with dr. Laverta Baltimore.      Allergies No Known Allergies  Level of Care/Admitting Diagnosis ED Disposition    ED Disposition Condition [facility] Area: [place] [[id number]]  Level of Care: ICU [6]  Covid Evaluation: Confirmed COVID Negative  Diagnosis: Pulmonary embolus [facility]) [[id number]]  Admitting Physician: Shellia Cleverly [[id number]]  Attending Physician: Shellia Cleverly [[id number]]  Estimated length of stay: 5 - 7 days  Certification:: I certify this patient will need inpatient services for at least 2 midnights  PT Class (Do Not Modify): Inpatient [101]  PT Acc Code (Do Not Modify): Private [1]       B Medical/Surgery History History reviewed. No pertinent past medical history. History reviewed. No pertinent surgical history.   A IV Location/Drains/Wounds Patient Lines/Drains/Airways Status   Active Line/Drains/Airways    Name:   Placement date:   Placement time:   Site:   Days:   Peripheral IV 04/23/19 Left Antecubital   04/23/19    1521    Antecubital   less than 1   Peripheral IV 04/23/19 Left Forearm   04/23/19    1532    Forearm   less than 1   Peripheral IV 04/23/19 Right Antecubital   04/23/19    1857    Antecubital   less than 1   CVC Triple Lumen 04/23/19 Left Subclavian 20 cm   04/23/19    2035     less than 1    NG/OG Tube Orogastric 18 Fr. Center mouth Xray;Aucultation   04/23/19    [place] mouth   less than 1   Urethral Catheter maggie b RN  Temperature probe 16 Fr.   04/23/19    1601    Temperature probe   less than 1   Airway 7.5 mm   04/23/19    1526     less than 1          Intake/Output Last 24 hours  Intake/Output Summary (Last 24 hours) at 04/23/2019 2147 Last data filed at 04/23/2019 2141 Gross per 24 hour  Intake 605.91 ml  Output -  Net 605.91 ml    Labs/Imaging Results for orders placed or performed during the hospital encounter of 04/23/19 (from the past 48 hour(s))  CBG monitoring, ED     Status: Abnormal   Collection Time: 04/23/19  3:21 PM  Result Value Ref Range   Glucose-Capillary 127 (H) 70 - 99 mg/dL   Comment 1 Notify RN    Comment 2 Document in Chart   Comprehensive metabolic panel     Status: Abnormal   Collection Time: 04/23/19  3:35 PM  Result Value Ref Range   Sodium 136 135 - 145 mmol/L   Potassium 3.2 (L) 3.5 - 5.1 mmol/L   Chloride 89 (L) 98 - 111 mmol/L   CO2  33 (H) 22 - 32 mmol/L   Glucose, Bld 126 (H) 70 - 99 mg/dL   BUN 46 (H) 6 - 20 mg/dL   Creatinine, Ser 1.28 (H) 0.61 - 1.24 mg/dL   Calcium 8.8 (L) 8.9 - 10.3 mg/dL   Total Protein 5.9 (L) 6.5 - 8.1 g/dL   Albumin 2.3 (L) 3.5 - 5.0 g/dL   AST 44 (H) 15 - 41 U/L   ALT 53 (H) 0 - 44 U/L   Alkaline Phosphatase 114 38 - 126 U/L   Total Bilirubin 0.7 0.3 - 1.2 mg/dL   GFR calc non Af Amer >60 >60 mL/min   GFR calc Af Amer >60 >60 mL/min   Anion gap 14 5 - 15    Comment: Performed at [facility], [place] [address] [place], [place] [address]  Ethanol     Status: None   Collection Time: 04/23/19  3:35 PM  Result Value Ref Range   Alcohol, Ethyl (B) <10 <10 mg/dL    Comment: (NOTE) Lowest detectable limit for serum alcohol is 10 mg/dL. For medical purposes only. Performed at [facility], [place] [address], [place], [place] [address]   Acetaminophen level     Status: Abnormal    Collection Time: 04/23/19  3:35 PM  Result Value Ref Range   Acetaminophen (Tylenol), Serum <10 (L) 10 - 30 ug/mL    Comment: (NOTE) Therapeutic concentrations vary significantly. A range of 10-30 ug/mL  may be an effective concentration for many patients. However, some  are best treated at concentrations outside of this range. Acetaminophen concentrations >150 ug/mL at 4 hours after ingestion  and >50 ug/mL at 12 hours after ingestion are often associated with  toxic reactions. Performed at [facility], [place] [address], [place], [place] [address]   Salicylate level     Status: None   Collection Time: 04/23/19  3:35 PM  Result Value Ref Range   Salicylate Lvl <7.8 2.8 - 30.0 mg/dL    Comment: Performed at [facility] [address] [place], [place] [address]  Troponin I - Once     Status: Abnormal   Collection Time: 04/23/19  3:35 PM  Result Value Ref Range   Troponin I 0.25 (HH) <0.03 ng/mL    Comment: CRITICAL RESULT CALLED TO, READ BACK BY AND VERIFIED WITH: Adora Fridge 3614 04/23/2019 WBOND Performed at [facility], [place] [address], [place],  [address]   CBC with Differential     Status: Abnormal   Collection Time: 04/23/19  3:35 PM  Result Value Ref Range   WBC 5.8 4.0 - 10.5 K/uL   RBC 3.55 (L) 4.22 - 5.81 MIL/uL   Hemoglobin 13.8 13.0 - 17.0 g/dL   HCT 39.3 39.0 - 52.0 %   MCV 110.7 (H) 80.0 - 100.0 fL   MCH 38.9 (H) 26.0 - 34.0 pg   MCHC 35.1 30.0 - 36.0 g/dL   RDW 15.2 11.5 - 15.5 %   Platelets 250 150 - 400 K/uL   nRBC 0.0 0.0 - 0.2 %   Neutrophils Relative % 79 %   Neutro Abs 4.5 1.7 - 7.7 K/uL   Lymphocytes Relative 9 %   Lymphs Abs 0.5 (L) 0.7 - 4.0 K/uL   Monocytes Relative 8 %   Monocytes Absolute 0.5 0.1 - 1.0 K/uL   Eosinophils Relative 2 %   Eosinophils Absolute 0.1 0.0 - 0.5 K/uL   Basophils Relative 2 %  Basophils Absolute 0.1 0.0 - 0.1 K/uL   Immature Granulocytes 0 %   Abs Immature Granulocytes 0.02 0.00 - 0.07 K/uL    Tear Drop Cells PRESENT     Comment: Performed at [facility] [address] [place], [place] [address]  Protime-INR     Status: None   Collection Time: 04/23/19  3:35 PM  Result Value Ref Range   Prothrombin Time 13.4 11.4 - 15.2 seconds   INR 1.0 0.8 - 1.2    Comment: (NOTE) INR goal varies based on device and disease states. Performed at [facility], [place] [address], [place], [place] [address]   Lactic acid, plasma     Status: Abnormal   Collection Time: 04/23/19  3:37 PM  Result Value Ref Range   Lactic Acid, Venous 2.1 (HH) 0.5 - 1.9 mmol/L    Comment: CRITICAL RESULT CALLED TO, READ BACK BY AND VERIFIED WITH: Adora Fridge 1630 04/23/2019 WBOND Performed at [facility] [address], [place], [place] [address]   Brain natriuretic peptide     Status: Abnormal   Collection Time: 04/23/19  3:37 PM  Result Value Ref Range   B Natriuretic Peptide 2,502.3 (H) 0.0 - 100.0 pg/mL    Comment: Performed at [facility] [address], [place], [place] [address]  I-STAT 7, (LYTES, BLD GAS, ICA, H+H)     Status: Abnormal   Collection Time: 04/23/19  4:04 PM  Result Value Ref Range   pH, Arterial 7.655 (HH) 7.350 - 7.450   pCO2 arterial 32.3 32.0 - 48.0 mmHg   pO2, Arterial 347.0 (H) 83.0 - 108.0 mmHg   Bicarbonate 36.1 (H) 20.0 - 28.0 mmol/L   TCO2 37 (H) 22 - 32 mmol/L   O2 Saturation 100.0 %   Acid-Base Excess 15.0 (H) 0.0 - 2.0 mmol/L   Sodium 134 (L) 135 - 145 mmol/L   Potassium 2.6 (LL) 3.5 - 5.1 mmol/L   Calcium, Ion 1.06 (L) 1.15 - 1.40 mmol/L   HCT 33.0 (L) 39.0 - 52.0 %   Hemoglobin 11.2 (L) 13.0 - 17.0 g/dL   Patient temperature HIDE    Sample type ARTERIAL    Comment NOTIFIED PHYSICIAN   Urinalysis, Routine w reflex microscopic     Status: Abnormal   Collection Time: 04/23/19  4:10 PM  Result Value Ref Range   Color, Urine YELLOW YELLOW   APPearance CLEAR CLEAR   Specific Gravity, Urine 1.008 1.005 - 1.030   pH 8.0 5.0 - 8.0    Glucose, UA NEGATIVE NEGATIVE mg/dL   Hgb urine dipstick SMALL (A) NEGATIVE   Bilirubin Urine NEGATIVE NEGATIVE   Ketones, ur NEGATIVE NEGATIVE mg/dL   Protein, ur >=300 (A) NEGATIVE mg/dL   Nitrite NEGATIVE NEGATIVE   Leukocytes,Ua NEGATIVE NEGATIVE   RBC / HPF 6-10 0 - 5 RBC/hpf   WBC, UA 0-5 0 - 5 WBC/hpf   Bacteria, UA RARE (A) NONE SEEN   Mucus PRESENT     Comment: Performed at [facility], [place] [address], [place], [place] [address]  Magnesium     Status: None   Collection Time: 04/23/19  4:33 PM  Result Value Ref Range   Magnesium 2.1 1.7 - 2.4 mg/dL    Comment: Performed at [facility], [place] [address], [place],  [address]  SARS Coronavirus 2 (CEPHEID- Performed in [facility]), [facility] Order     Status: None   Collection  Time: 04/23/19  5:06 PM  Result Value Ref Range   SARS Coronavirus 2 NEGATIVE NEGATIVE    Comment: (NOTE) If result is NEGATIVE SARS-CoV-2 target nucleic acids are NOT DETECTED. The SARS-CoV-2 RNA is generally detectable in upper and lower  respiratory specimens during the acute phase of infection. The lowest  concentration of SARS-CoV-2 viral copies this assay can detect is 250  copies / mL. A negative result does not preclude SARS-CoV-2 infection  and should not be used as the sole basis for treatment or other  patient management decisions.  A negative result may occur with  improper specimen collection / handling, submission of specimen other  than nasopharyngeal swab, presence of viral mutation(s) within the  areas targeted by this assay, and inadequate number of viral copies  (<250 copies / mL). A negative result must be combined with clinical  observations, patient history, and epidemiological information. If result is POSITIVE SARS-CoV-2 target nucleic acids are DETECTED. The SARS-CoV-2 RNA is generally detectable in upper and lower  respiratory specimens dur ing the acute phase of infection.  Positive  results are  indicative of active infection with SARS-CoV-2.  Clinical  correlation with patient history and other diagnostic information is  necessary to determine patient infection status.  Positive results do  not rule out bacterial infection or co-infection with other viruses. If result is PRESUMPTIVE POSTIVE SARS-CoV-2 nucleic acids MAY BE PRESENT.   A presumptive positive result was obtained on the submitted specimen  and confirmed on repeat testing.  While 2019 novel coronavirus  (SARS-CoV-2) nucleic acids may be present in the submitted sample  additional confirmatory testing may be necessary for epidemiological  and / or clinical management purposes  to differentiate between  SARS-CoV-2 and other Sarbecovirus currently known to infect humans.  If clinically indicated additional testing with an alternate test  methodology [phone]) is advised. The SARS-CoV-2 RNA is generally  detectable in upper and lower respiratory sp ecimens during the acute  phase of infection. The expected result is Negative. Fact Sheet for Patients:  StrictlyIdeas.no Fact Sheet for Healthcare Providers: BankingDealers.co.za This test is not yet approved or cleared by the [place] FDA and has been authorized for detection and/or diagnosis of SARS-CoV-2 by FDA under an Emergency Use Authorization (EUA).  This EUA will remain in effect (meaning this test can be used) for the duration of the COVID-19 declaration under Section 564(b)(1) of the Act, 21 U.S.C. section 360bbb-3(b)(1), unless the authorization is terminated or revoked sooner. Performed at [facility], [place] [address] [place], [place] [address]   Lactic acid, plasma     Status: Abnormal   Collection Time: 04/23/19  6:37 PM  Result Value Ref Range   Lactic Acid, Venous 4.3 (HH) 0.5 - 1.9 mmol/L    Comment: CRITICAL RESULT CALLED TO, READ BACK BY AND VERIFIED WITH: Roddie Mc 2015 05/03/2019  WBOND CALLED 04/23/2019 Performed at [facility], [place] [address], [place], [place] [address] CORRECTED ON 06/08 AT 2146: PREVIOUSLY REPORTED AS 4.3 CRITICAL RESULT CALLED TO, READ BACK BY AND VERIFIED WITH: S BAILEY,RN 2015 05/03/2019 WBOND   I-STAT 7, (LYTES, BLD GAS, ICA, H+H)     Status: Abnormal   Collection Time: 04/23/19  8:48 PM  Result Value Ref Range   pH, Arterial 7.672 (HH) 7.350 - 7.450   pCO2 arterial 29.3 (L) 32.0 - 48.0 mmHg   pO2, Arterial 118.0 (H) 83.0 - 108.0 mmHg   Bicarbonate 34.0 (H) 20.0 - 28.0  mmol/L   TCO2 35 (H) 22 - 32 mmol/L   O2 Saturation 99.0 %   Acid-Base Excess 13.0 (H) 0.0 - 2.0 mmol/L   Sodium 132 (L) 135 - 145 mmol/L   Potassium 3.4 (L) 3.5 - 5.1 mmol/L   Calcium, Ion 1.14 (L) 1.15 - 1.40 mmol/L   HCT 34.0 (L) 39.0 - 52.0 %   Hemoglobin 11.6 (L) 13.0 - 17.0 g/dL   Patient temperature 97.8 F    Collection site RADIAL, ALLEN'S TEST ACCEPTABLE    Drawn by RT    Sample type ARTERIAL    Comment NOTIFIED PHYSICIAN    Ct Head Wo Contrast  Result Date: 04/23/2019 CLINICAL DATA:  Shortness of breath, became unresponsive on the way home from heart failure clinic. EXAM: CT HEAD WITHOUT CONTRAST TECHNIQUE: Contiguous axial images were obtained from the base of the skull through the vertex without intravenous contrast. COMPARISON:  None. FINDINGS: Brain: The brainstem, cerebellum, cerebral peduncles, thalami, basal ganglia, basilar cisterns, and ventricular system appear within normal limits. No intracranial hemorrhage, mass lesion, or acute CVA. Vascular: Unremarkable Skull: Left mastoidectomy.  Gas tracks along the left facial Sinuses/Orbits: Left mastoidectomy. The remaining paranasal sinuses appear clear. Other: There is gas tracking along the left and to a lesser extent pterygoid musculature along the left masseter muscle and lateral to the left mandibular condyle. Air fluid level in the nasopharynx. Patient is orally intubated. IMPRESSION: 1. No acute  intracranial findings. 2. There is gas tracking along the margins the left pterygoid muscle and along the posterior margin of the left masseter muscle, cause uncertain, but conceivably related to intubation. I do not see adjacent facial fractures. Patient does have a left mastoidectomy. Electronically Signed   By: Van Clines M.D.   On: 04/23/2019 18:56   Ct Chest W Contrast  Addendum Date: 04/23/2019   ADDENDUM REPORT: 04/23/2019 19:10 ADDENDUM: The original report was by Dr. Van Clines. The following addendum is by Dr. Van Clines: Critical Value/emergent results were called by telephone at the time of interpretation on 04/23/2019 at 7:10 pm to Dr. Laverta Baltimore, who verbally acknowledged these results. Electronically Signed   By: Van Clines M.D.   On: 04/23/2019 19:10   Result Date: 04/23/2019 CLINICAL DATA:  Shortness of breath, patient became unresponsive after leaving heart failure clinic. EXAM: CT CHEST WITH CONTRAST TECHNIQUE: Multidetector CT imaging of the chest was performed during intravenous contrast administration. CONTRAST:  25mL OMNIPAQUE IOHEXOL 300 MG/ML  SOLN COMPARISON:  04/23/2019 chest radiograph FINDINGS: Cardiovascular: Mild cardiomegaly. Left ventricular hypertrophy with the interventricular septal thickness of 2.2 cm. Today's exam was not performed as a CT angiogram. Nevertheless, there is discernible filling defect compatible with thrombus in the left upper lobe pulmonary artery into a lesser extent in the right lower lobe pulmonary artery as shown for example on images 79 through 87 of series 3. Clot burden is small to moderate. Mediastinum/Nodes: Diffuse edema in the mediastinal adipose tissues. Endotracheal tube satisfactorily positioned. Nasogastric tube enters the stomach. Lungs/Pleura: Large bilateral pleural effusions, over 50% of hemithoracic volume bilaterally. Secondary pulmonary lobular interstitial accentuation especially in the lung apices compatible with  pulmonary edema. Extensive passive atelectasis. Upper Abdomen: Edema in the upper abdominal adipose tissue compatible with widespread third spacing. Musculoskeletal: Diffuse subcutaneous edema compatible with widespread third spacing of fluid. Age indeterminate superior endplate wedging at J50. Vacuum disc phenomenon at T9-10. IMPRESSION: 1. Acute pulmonary embolus involving the right upper lobe and right lower lobe. Clot burden is small to  moderate. Positive for acute PE with CT evidence of right heart strain (RV/LV Ratio = 1.1) consistent with at least submassive (intermediate risk) PE. The presence of right heart strain has been associated with an increased risk of morbidity and mortality. Please activate Code PE by paging [phone]. 2. Left ventricular hypertrophy and mild cardiomegaly. 3. Large bilateral pleural effusions with passive atelectasis. Only a minority of the lung is aerated. This may reduce overall respiratory reserve. 4. Third spacing of fluid with edema in mediastinal and subcutaneous tissues. Secondary pulmonary lobular interstitial accentuation in the lungs compatible with interstitial pulmonary edema. 5. Age-indeterminate mild superior endplate compression at T10. Radiology assistant personnel have been notified to put me in telephone contact with the referring physician or the referring physician's clinical representative in order to discuss these findings. Once this communication is established I will issue an addendum to this report for documentation purposes. Electronically Signed: By: Van Clines M.D. On: 04/23/2019 19:07   Dg Chest Portable 1 View  Result Date: 04/23/2019 CLINICAL DATA:  Catheter placement EXAM: PORTABLE CHEST 1 VIEW COMPARISON:  04/23/2019 FINDINGS: Endotracheal tube terminates above the carina by approximately 6 cm. The left-sided central venous catheter is well positioned with tip terminating near the cavoatrial junction. The enteric tube extends below the  left hemidiaphragm. Large bilateral pleural effusions are again noted. No pneumothorax. Atelectasis is noted bilaterally. IMPRESSION: 1. Lines and tubes as above.  No pneumothorax. 2. Persistent large bilateral pleural effusions. Electronically Signed   By: Constance Holster M.D.   On: 04/23/2019 20:57   Dg Chest Portable 1 View  Result Date: 04/23/2019 CLINICAL DATA:  Hypoxia EXAM: PORTABLE CHEST 1 VIEW COMPARISON:  None. FINDINGS: Endotracheal tube tip is 6.2 cm above the carina. Nasogastric tube tip and side port are below the diaphragm. No evident pneumothorax. There are pleural effusions bilaterally. There is consolidation in the left base. There is mild interstitial edema throughout the lungs bilaterally. Heart is upper normal in size. The pulmonary vascularity is normal. No adenopathy appreciable. No bone lesions. IMPRESSION: Tube positions as described without pneumothorax. Layering pleural effusion on the right. Apparent consolidation with effusion left base. Pneumonia and/or aspiration suspected in left base. There is interstitial prominence throughout the lungs which may represent noncardiogenic edema or possibly atypical infection. Heart upper normal in size.  No adenopathy appreciable. Electronically Signed   By: Lowella Grip III M.D.   On: 04/23/2019 16:04    Pending Labs Unresulted Labs (From admission, onward)    Start     Ordered   04/25/19 0500  Heparin level (unfractionated)  Daily,   R     04/23/19 1922   04/24/19 0500  CBC  Daily,   R     04/23/19 1922   04/24/19 0500  Comprehensive metabolic panel  Daily,   R     04/23/19 2010   04/24/19 0500  CBC  Daily,   R     04/23/19 2010   04/24/19 0300  Heparin level (unfractionated)  Once-Timed,   R     04/23/19 1922   04/23/19 2100  Blood gas, arterial  Once,   R     04/23/19 2010   04/23/19 2007  HIV antibody (Routine Testing)  Once,   R     04/23/19 2010   04/23/19 1532  Urine culture  ONCE - STAT,   STAT    Question:   Patient immune status  Answer:  Normal   04/23/19 1532   04/23/19 1532  Culture, blood (routine x 2)  BLOOD CULTURE X 2,   STAT    Question:  Patient immune status  Answer:  Normal   04/23/19 1532          Vitals/Pain Today's Vitals   04/23/19 2030 04/23/19 2048 04/23/19 2100 04/23/19 2130  BP: 91/72  100/80 100/79  Pulse: 72  72 69  Resp: 14  (!) 0 14  Temp: 98 F (36.7 C)  97.9 F (36.6 C) 98 F (36.7 C)  SpO2: 100% 100% 100% 100%  Weight:      Height:      PainSc:        Isolation Precautions Droplet and Contact precautions  Medications Medications  amiodarone (NEXTERONE) 1.8 mg/mL load via infusion 150 mg (150 mg Intravenous Not Given 04/23/19 1553)    Followed by  amiodarone (NEXTERONE PREMIX) 360-4.14 MG/200ML-% (1.8 mg/mL) IV infusion (0 mg/hr Intravenous Stopped 04/23/19 2141)    Followed by  amiodarone (NEXTERONE PREMIX) 360-4.14 MG/200ML-% (1.8 mg/mL) IV infusion (30 mg/hr Intravenous New Bag/Given 04/23/19 2142)  fentaNYL (SUBLIMAZE) injection 100 mcg (100 mcg Intravenous Given 04/23/19 1705)  fentaNYL (SUBLIMAZE) injection 100 mcg (100 mcg Intravenous Given 04/23/19 1856)  midazolam (VERSED) injection 2 mg (has no administration in time range)  potassium chloride 10 mEq in 100 mL IVPB (0 mEq Intravenous Stopped 04/23/19 1934)  midazolam (VERSED) 2 MG/2ML injection (has no administration in time range)  fentaNYL (SUBLIMAZE) 100 MCG/2ML injection (has no administration in time range)  norepinephrine (LEVOPHED) 4mg  in 273mL premix infusion (8 mcg/min Intravenous New Bag/Given (Non-Interop) 04/23/19 2145)  midazolam (VERSED) 2 MG/2ML injection (has no administration in time range)  dexmedetomidine (PRECEDEX) 200 MCG/50ML (4 mcg/mL) infusion (0.2 mcg/kg/hr  70.6 kg Intravenous Rate/Dose Change 04/23/19 2143)  fentaNYL (SUBLIMAZE) injection 100 mcg (0 mcg Intravenous Hold 04/23/19 1837)  fentaNYL (SUBLIMAZE) 100 MCG/2ML injection (has no administration in time range)  midazolam  (VERSED) 2 MG/2ML injection (has no administration in time range)  heparin ADULT infusion 100 units/mL (25000 units/250mL sodium chloride 0.45%) (1,150 Units/hr Intravenous New Bag/Given 04/23/19 2132)  fentaNYL 2585mcg in NS 21mL (108mcg/ml) infusion-PREMIX (200 mcg/hr Intravenous Rate/Dose Change 04/23/19 2020)  famotidine (PEPCID) IVPB 20 mg premix (has no administration in time range)  aspirin chewable tablet 324 mg (has no administration in time range)    Or  aspirin suppository 300 mg (has no administration in time range)  0.9 %  sodium chloride infusion (has no administration in time range)  amiodarone (CORDARONE) 150 mg in dextrose 5 % 100 mL bolus ( Intravenous Stopped 04/23/19 2136)  etomidate (AMIDATE) injection (20 mg Intravenous Given 04/23/19 1524)  rocuronium (ZEMURON) injection (100 mg Intravenous Given 04/23/19 1525)  0.9 %  sodium chloride infusion ( Intravenous Stopped 04/23/19 2135)  midazolam (VERSED) injection 2 mg (2 mg Intravenous Given 04/23/19 2016)  calcium gluconate 1 g in sodium chloride 0.9 % 100 mL IVPB (0 g Intravenous Stopped 04/23/19 1813)  potassium chloride (KLOR-CON) packet 40 mEq (40 mEq Per Tube Given 04/23/19 1645)  magnesium sulfate IVPB 1 g 100 mL (0 g Intravenous Stopped 04/23/19 2112)  iohexol (OMNIPAQUE) 300 MG/ML solution 75 mL (75 mLs Intravenous Contrast Given 04/23/19 1804)  furosemide (LASIX) injection 80 mg (80 mg Intravenous Given 04/23/19 2043)  heparin bolus via infusion 5,000 Units (5,000 Units Intravenous Bolus from Bag 04/23/19 2134)    Mobility walks     Focused Assessments Pulmonary Assessment Handoff:  Lung sounds: Bilateral Breath Sounds: Clear O2 Device: Ventilator  R Recommendations: See Admitting Provider Note  Report given to:   Additional Notes: pt does have some bruising on upper torso and throughout body due to rare disease process

## 2019-04-23 NOTE — ED Triage Notes (Signed)
Pt arrives in passenger seat with family after becoming unresponsive on the way home from the heart failure clinic. Pt has agonal respirations with weak pulses on arrival to trauma bay being bagged by sort nurse- pt placed on zoll and preparing to intubate with dr. Laverta Baltimore.

## 2019-04-23 NOTE — ED Notes (Signed)
Attempted to call report to the floor. 

## 2019-04-24 ENCOUNTER — Inpatient Hospital Stay (HOSPITAL_COMMUNITY): Payer: Managed Care, Other (non HMO)

## 2019-04-24 DIAGNOSIS — I43 Cardiomyopathy in diseases classified elsewhere: Secondary | ICD-10-CM

## 2019-04-24 DIAGNOSIS — I2699 Other pulmonary embolism without acute cor pulmonale: Secondary | ICD-10-CM

## 2019-04-24 DIAGNOSIS — I2609 Other pulmonary embolism with acute cor pulmonale: Secondary | ICD-10-CM

## 2019-04-24 DIAGNOSIS — E854 Organ-limited amyloidosis: Secondary | ICD-10-CM

## 2019-04-24 LAB — CBC
HCT: 37.2 % — ABNORMAL LOW (ref 39.0–52.0)
Hemoglobin: 12.9 g/dL — ABNORMAL LOW (ref 13.0–17.0)
MCH: 37.9 pg — ABNORMAL HIGH (ref 26.0–34.0)
MCHC: 34.7 g/dL (ref 30.0–36.0)
MCV: 109.4 fL — ABNORMAL HIGH (ref 80.0–100.0)
Platelets: 274 10*3/uL (ref 150–400)
RBC: 3.4 MIL/uL — ABNORMAL LOW (ref 4.22–5.81)
RDW: 15.4 % (ref 11.5–15.5)
WBC: 7 10*3/uL (ref 4.0–10.5)
nRBC: 0 % (ref 0.0–0.2)

## 2019-04-24 LAB — CORTISOL: Cortisol, Plasma: 24.7 ug/dL

## 2019-04-24 LAB — COMPREHENSIVE METABOLIC PANEL
ALT: 48 U/L — ABNORMAL HIGH (ref 0–44)
AST: 31 U/L (ref 15–41)
Albumin: 2 g/dL — ABNORMAL LOW (ref 3.5–5.0)
Alkaline Phosphatase: 110 U/L (ref 38–126)
Anion gap: 11 (ref 5–15)
BUN: 37 mg/dL — ABNORMAL HIGH (ref 6–20)
CO2: 32 mmol/L (ref 22–32)
Calcium: 8.4 mg/dL — ABNORMAL LOW (ref 8.9–10.3)
Chloride: 94 mmol/L — ABNORMAL LOW (ref 98–111)
Creatinine, Ser: 1.13 mg/dL (ref 0.61–1.24)
GFR calc Af Amer: >60 mL/min (ref >60)
GFR calc non Af Amer: >60 mL/min (ref >60)
Glucose, Bld: 135 mg/dL — ABNORMAL HIGH (ref 70–99)
Potassium: 3.7 mmol/L (ref 3.5–5.1)
Sodium: 137 mmol/L (ref 135–145)
Total Bilirubin: 0.6 mg/dL (ref 0.3–1.2)
Total Protein: 5.3 g/dL — ABNORMAL LOW (ref 6.5–8.1)

## 2019-04-24 LAB — ECHOCARDIOGRAM COMPLETE
Height: 72 in
Weight: 2500.9 oz

## 2019-04-24 LAB — GLUCOSE, CAPILLARY
Glucose-Capillary: 96 mg/dL (ref 70–99)
Glucose-Capillary: 113 mg/dL — ABNORMAL HIGH (ref 70–99)
Glucose-Capillary: 112 mg/dL — ABNORMAL HIGH (ref 70–99)
Glucose-Capillary: 115 mg/dL — ABNORMAL HIGH (ref 70–99)
Glucose-Capillary: 114 mg/dL — ABNORMAL HIGH (ref 70–99)
Glucose-Capillary: 123 mg/dL — ABNORMAL HIGH (ref 70–99)

## 2019-04-24 LAB — MAGNESIUM: Magnesium: 1.9 mg/dL (ref 1.7–2.4)

## 2019-04-24 LAB — HEPARIN LEVEL (UNFRACTIONATED)
Heparin Unfractionated: 0.5 IU/mL (ref 0.30–0.70)
Heparin Unfractionated: 0.36 IU/mL (ref 0.30–0.70)

## 2019-04-24 LAB — LACTIC ACID, PLASMA: Lactic Acid, Venous: 1.1 mmol/L (ref 0.5–1.9)

## 2019-04-24 LAB — HIV ANTIBODY (ROUTINE TESTING W REFLEX): HIV Screen 4th Generation wRfx: NONREACTIVE

## 2019-04-24 LAB — MRSA PCR SCREENING: MRSA by PCR: NEGATIVE

## 2019-04-24 MED ORDER — CHLORHEXIDINE GLUCONATE 0.12% ORAL RINSE (MEDLINE KIT)
15.0000 mL | Freq: Two times a day (BID) | OROMUCOSAL | Status: DC
  Administered 2019-04-24 – 2019-04-25 (×3): 15 mL via OROMUCOSAL

## 2019-04-24 MED ORDER — VASOPRESSIN 20 UNIT/ML IV SOLN
0.0300 [IU]/min | INTRAVENOUS | Status: DC
  Filled 2019-04-24: qty 2

## 2019-04-24 MED ORDER — ORAL CARE MOUTH RINSE
15.0000 mL | OROMUCOSAL | Status: DC
  Administered 2019-04-24 – 2019-04-25 (×11): 15 mL via OROMUCOSAL

## 2019-04-24 MED ORDER — FUROSEMIDE 10 MG/ML IJ SOLN
20.0000 mg | Freq: Once | INTRAMUSCULAR | Status: AC
  Administered 2019-04-24: 20 mg via INTRAVENOUS
  Filled 2019-04-24: qty 2

## 2019-04-24 MED ORDER — NON FORMULARY: 6.0000 mg | Freq: Once | Status: DC

## 2019-04-24 MED ORDER — MELATONIN 3 MG PO TABS
6.0000 mg | ORAL_TABLET | Freq: Once | ORAL | Status: AC
  Administered 2019-04-25: 01:00:00 6 mg
  Filled 2019-04-24 (×2): qty 2

## 2019-04-24 MED ORDER — MAGNESIUM SULFATE IN D5W 1-5 GM/100ML-% IV SOLN
1.0000 g | Freq: Once | INTRAVENOUS | Status: AC
  Administered 2019-04-24: 15:00:00 1 g via INTRAVENOUS
  Filled 2019-04-24: qty 100

## 2019-04-24 MED ORDER — VITAL AF 1.2 CAL PO LIQD
1000.0000 mL | ORAL | Status: DC
  Administered 2019-04-24: 1000 mL

## 2019-04-24 MED ORDER — CHLORHEXIDINE GLUCONATE CLOTH 2 % EX PADS
6.0000 | MEDICATED_PAD | Freq: Every day | CUTANEOUS | Status: DC
  Administered 2019-04-24 – 2019-04-26 (×3): 6 via TOPICAL

## 2019-04-24 MED FILL — Medication: Qty: 1 | Status: AC

## 2019-04-24 NOTE — Progress Notes (Addendum)
NAME:  Erik Costa, MRN:  [id number], DOB:  1960-12-31, LOS: 1 ADMISSION DATE:  04/23/2019, CONSULTATION DATE: 04/23/2019 REFERRING MD:  ER, CHIEF COMPLAINT: Sudden syncope  Brief History   Patient is a 58 year old with systemic amyloidosis treated at [facility] to the emergency room today with sudden collapse found to have pulmonary embolus on CT scan of the chest.  History of present illness   She is a 58 year old diagnosed in December of last year with amyloidosis.  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  He is followed at [facility].  He is treated with cyclophosphamide, Velcade, dexamethasone.  He was coming home from a cardiology appointment.  He has been told that he does have significant amyloid involvement in his heart.  He previously has had very large according to his wife 5 L pleural effusions that have required thoracentesis and was told this was due to amyloid involvement of his heart.  She says his ejection fraction is 50% presumably with reduced left ventricular chamber size and significant systolic dysfunction.  CT scan of the chest showed small to moderate clot burden in the left lung.  He did have documented runs of ventricular tachycardia on arrival and required intubation due to hypoxemia and arrhythmias.  He currently is on amiodarone receiving fentanyl with Precedex being titrated off intermittent Versed and heparin drip is about to be started.  I discussed with his wife at length the possibility of using TPA due to the fact that he is on Levophed at 8 mics for hypotension.  I believe his hypotension is in part due to his systolic dysfunction in combination with his PE and with his systemic amyloidosis I do not know with the increased risk of significant or active complications might be.  Because of this we decided to withhold TPA and use high-dose heparin.  His blood pressure currently is about 100/60 on the 8  mics of Levophed.  He also is on amiodarone drip. His wife does tell me that he has had significant issues with worsening stamina over the last month or 2 decreased ability to get around presumably due to progressive amyloid.  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 Clot burden is small to moderate. Positive for acute PE with CT evidence of right heart strain (RV/LV Ratio = 1.1) consistent with at least submassive (intermediate risk) PE. The presence of right heart strain has been associated with an increased risk of morbidity and mortality. Please activate Code PE by paging [phone]. 2. Left ventricular hypertrophy and mild cardiomegaly. 3. Large bilateral pleural effusions with passive atelectasis. Only a minority of the lung is aerated. This may reduce overall respiratory reserve. 4. Third spacing of fluid with edema in mediastinal and subcutaneous tissues. Secondary pulmonary lobular interstitial accentuation in the lungs compatible with interstitial pulmonary edema. 5. Age-indeterminate mild superior endplate compression at T10.    Micro Data:  na  Antimicrobials:  na  Interim history/subjective:  6/9 CXR >>stable bilateral pleural effusions Vascular congestion Weaning on 40% 5/5 Net + 269 Objective   Blood pressure (!) 83/61, pulse 73,  temperature 98.8 F (37.1 C), temperature source Oral, resp. rate 14, height 6' (1.829 m), weight 70.9 kg, SpO2 100 %.    Vent Mode: PRVC FiO2 (%):  [40 %-100 %] 40 % Set Rate:  [14 bmp-16 bmp] 14 bmp Vt Set:  [470 mL-620 mL] 470 mL PEEP:  [5  cmH20] 5 cmH20 Plateau Pressure:  [13 TOI71-24 cmH20] 13 cmH20   Intake/Output Summary (Last 24 hours) at 04/24/2019 0908 Last data filed at 04/24/2019 0600 Gross per 24 hour  Intake 1224.41 ml  Output 1100 ml  Net 124.41 ml   Filed Weights   04/23/19 1730 04/23/19 2250 04/24/19 0351  Weight: 70.6 kg 70.9 kg 70.9 kg    Examination: General: thin wm, awake and alert,intubated  HENT: NCAT, wnl, petechiae over neck ches and eyelids, No LAD Lungs:Bilateral chest excursion, Clear upper lobes, rales per bases Cardiovascular: S1, S2, rrr, No MRG, SR with Wide complex per tele Abdomen: Soft, ND, NT, BS +, scaphoid benign Extremities: 2-3 peripheral edema, no mottling, no obvious deformities Neuro: Awake and alert, Following commands,MAE x 4, A&O x 3 GU: Foley cath with clear amber urine  Resolved Hospital Problem list   na  Assessment & Plan:  Pulmonary embolus: Right heart strain per CT   EF 50% per last Echo  BNP of  2500 on admission  Unknown risk of potential bleeding due to his systemic amyloidosis   Plan   Treat with full dose heparin without TPA   Continue mechanical ventilation as necessary  Echo ( in process) to evaluate heart strain  Support BP with MAP goal of > 65   V tach on admission Amyloidosis with cards involvement Plan Tele monitoring EKG prn Goal Mag > 2 Goal K Greater than 4 Trend BMET Trend BNP Consider Cards consult  Acute Respiratory Failure CXR with Pulmonary edema Effusions per CXR Plan Wean FiO2 and PEEP as able SBT Q am Minimize sedation as able Consider diuresis KVO maintenence  Consider extubation   Hypotension 2/2 systolic dysfunction in combination with his PE and with his systemic amyloidosis  Plan Titrate pressors for MAP goal > 65 mm Hg   Amyloidosis:  Cardiac and renal involvement Follow[facility] Rapidly progressive despite aggressive therapy Currently will withhold cyclophosphamide.Takes dexamethasone every Friday   Plan:  Will check cortisol now  hypotension and absence of dexamethasone add  decadron every Friday, or add dosage based on cortisol level to be drawn today  Renal Amyloid Chronic Kidney Injury Hypokalemia Plan Monitor UO  Trend BMET Replete electrolytes as needed  GI Consider initiating TF  Best practice:  Diet: npo/ TF Pain/Anxiety/Delirium protocol (if indicated):  Fentanyl prn VAP protocol (if indicated): yes DVT prophylaxis: heparin GI prophylaxis: pepcid Glucose control: monitor CBG Mobility: bedrest Code Status: full Family Communication: wife Disposition: icu  Labs   CBC: Recent Labs  Lab 04/23/19 1535 04/23/19 1604 04/23/19 2048 04/24/19 0302  WBC 5.8  --   --  7.0  NEUTROABS 4.5  --   --   --   HGB 13.8 11.2* 11.6* 12.9*  HCT 39.3 33.0* 34.0* 37.2*  MCV 110.7*  --   --  109.4*  PLT 250  --   --  580    Basic Metabolic Panel: Recent Labs  Lab 04/23/19 1535 04/23/19 1604 04/23/19 1633 04/23/19 2048 04/24/19 0302  NA 136 134*  --  132* 137  K 3.2* 2.6*  --  3.4* 3.7  CL 89*  --   --   --  94*  CO2 33*  --   --   --  32  GLUCOSE 126*  --   --   --  135*  BUN 46*  --   --   --  37*  CREATININE 1.28*  --   --   --  1.13  CALCIUM 8.8*  --   --   --  8.4*  MG  --   --  2.1  --   --    GFR: Estimated Creatinine Clearance: 70.6 mL/min (by C-G formula based on SCr of 1.13 mg/dL). Recent Labs  Lab 04/23/19 1535 04/23/19 1537 04/23/19 1837 04/24/19 0302  WBC 5.8  --   --  7.0  LATICACIDVEN  --  2.1* 4.3*  --     Liver Function Tests: Recent Labs  Lab 04/23/19 1535 04/24/19 0302  AST 44* 31  ALT 53* 48*  ALKPHOS 114 110  BILITOT 0.7 0.6  PROT 5.9* 5.3*  ALBUMIN 2.3* 2.0*   No results for input(s): LIPASE, AMYLASE in the last 168 hours. No results for input(s): AMMONIA in the last 168 hours.  ABG    Component Value Date/Time   PHART 7.672 (HH) 04/23/2019 2048   PCO2ART 29.3 (L) 04/23/2019 2048   PO2ART 118.0 (H) 04/23/2019 2048    HCO3 34.0 (H) 04/23/2019 2048   TCO2 35 (H) 04/23/2019 2048   O2SAT 99.0 04/23/2019 2048     Coagulation Profile: Recent Labs  Lab 04/23/19 1535  INR 1.0    Cardiac Enzymes: Recent Labs  Lab 04/23/19 1535  TROPONINI 0.25*    HbA1C: No results found for: HGBA1C  CBG: Recent Labs  Lab 04/23/19 1521 04/23/19 2253 04/24/19 0324 04/24/19 0712  GLUCAP 127* 114* 96 113*    Review of Systems:   As above  Past Medical History  He,  has no past medical history on file.   Surgical History   History reviewed. No pertinent surgical history.   Social History      Family History   His family history is not on file.   Allergies No Known Allergies   Home Medications  Prior to Admission medications   Not on File     Critical care time: 40 minutes spent in evaluation and critical care [place], [facility] Pager # [phone] After 4 pm call [phone] 04/24/2019 10:04 AM

## 2019-04-24 NOTE — Progress Notes (Signed)
  Echocardiogram 2D Echocardiogram has been performed.  Erik Costa 04/24/2019, 10:00 AM

## 2019-04-24 NOTE — Progress Notes (Signed)
ANTICOAGULATION CONSULT NOTE   Pharmacy Consult for Heparin Indication: pulmonary embolus  No Known Allergies  Patient Measurements: Height: 6' (182.9 cm) Weight: 156 lb 4.9 oz (70.9 kg) IBW/kg (Calculated) : 77.6  Heparin dosing weight: 70.6kg  Vital Signs: Temp: 98.5 F (36.9 C) (06/09 1521) Temp Source: Oral (06/09 1521) BP: 98/74 (06/09 1621) Pulse Rate: 83 (06/09 1621)  Labs: Recent Labs    04/23/19 1535 04/23/19 1604 04/23/19 2048 04/24/19 0302 04/24/19 1600  HGB 13.8 11.2* 11.6* 12.9*  --   HCT 39.3 33.0* 34.0* 37.2*  --   PLT 250  --   --  274  --   LABPROT 13.4  --   --   --   --   INR 1.0  --   --   --   --   HEPARINUNFRC  --   --   --  0.50 0.36  CREATININE 1.28*  --   --  1.13  --   TROPONINI 0.25*  --   --   --   --     Estimated Creatinine Clearance: 70.6 mL/min (by C-G formula based on SCr of 1.13 mg/dL).   Medical History: History reviewed. No pertinent past medical history.  Assessment: Erik Costa is a 58yo male admitted with pulmonary embolism. Pharmacy consulted to start heparin infusion. Chest CT: Acute pulmonary embolus involving RUL and RLL. Clot burden is small to moderate. Positive for acute PE with CT evidence of right heart strain. RV/LV Ratio = 1.1. Reviewed outpatient notes from [facility] and patient not on prior anticoagulation. Hgb 11.2 and pltc 250.  Heparin level this afternoon remains within goal range.  Goal of Therapy:  Heparin level 0.3-0.7 units/ml Monitor platelets by anticoagulation protocol: Yes   Plan:  Cont heparin at 1150 units/hr Monitor daily heparin level, CBC, s/sx of bleeding  Marguerite Olea, [facility] Clinical Pharmacist Phone [phone]  04/24/2019 4:53 PM

## 2019-04-24 NOTE — Consult Note (Addendum)
Cardiology Consultation:   Patient ID: Erik Costa; [id number]; 07-Nov-1961   Admit date: 04/23/2019 Date of Consult: 04/24/2019  Primary Care Provider: Azzie Roup, MD Primary Cardiologist: Dr. Cheryl Flash, [facility]  Patient Profile:   Erik Costa is a 58 y.o. 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  History of Present Illness:   Erik Costa is a 58 year old male with a history stated above who presented to [facility] on 04/23/2019 after a sudden collapse found to have an acute small to moderate PE in LUL & RLL on CT scan of the chest with very large bilateral pleural effusions. On ED arrival, he had documented runs of ventricular tachycardia and required intubation due to hypoxemia and arrhythmias. TPA was initially considered however due to his hypotension, largely thought to be in part to his systolic dysfunction and in combination with the PE and systemic amyloidosis, this was deferred and high-dose heparin was used.  He continues to be on vasopressors and amiodarone. Per chart review, wife states that he has had significant issues with worsening stamina over the last month or so secondary to decreased ability to get around presumably due to progressive amyloid. They were on the way home from a Cardiology appointment from [facility] when the wife, who was driving, noticed that he was not responsive. She immediately came to the ED at [facility]. They reside in [place]. On my interview, the patient is ventilated, but is alert and oriented and able to communicate with paper and pen. He states that he struggles with chronic fatigue, but this has been stable. He denies recent chest pain, LE swelling. Reports weight has been stable. States that he takes Torsemide 138m QD (chart review states 1037mBID MWF and  10023mD every other day). He was initially receiving chemo treatments every Friday until recently. This is now every other Friday. He does not remember much prior to his collapse. He is currently communicating with his wife via [employer] Per chart review of advanced cardiology note from [facility] [place]/27/2020. 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 Exercise stress EKG performed on 01/19/2017 was abnormal with 5 beats of NSVT on recovery and he was therefore referred for coronary angiography shortly after. His coronary arteries showed mild luminal irregularities with elevated LVEDP (01/21/2017). First imaging study done 02/01/17 showed an LVEF 55-60%, mild LVH.  He wore a ZIO patch that showed short runs of SVT and therefore Toprol XL 25 mg daily was initiated. The patient later sent a message on 08/09/18 stating that he's been experiencing swelling of the ankles and SOB when doing anything strenuous such as walking uphill or stairs, working in the yard etc ever since he started taking metoprolol. When he tried going off metoprolol, the swelling may have gotten a bit better but exertional intolerance persisted.  Toprol was eventually discontinued by Dr. UpaNorman Clayen he was seen 10/20/2018 due to hypotension.  In 09/2018, he developed R temporal headache w/ right facial weakness and marked ESR elevation. He was started on steroids empirically for possible GCA. Temporal artery biopsy was negative for giant cells. He was urgently seen by Neurology, Ophtho, and Nephrology for nephrotic  range proteinuria. In 04/2018, he had increasing IgM kappa chains. Fat pad and bone marrow biopsy performed 10/31/18 was Congo red positivity in both. Renal biopsy (11/02/18) was also positive for Congo red. Repeat echocardiogram done 10/27/18 was dramatically different from the 02/01/2017 study. There was new moderate LVH  w/ sparkly pattern, LV strain abnormality, trivial pericardial effusion, biatrial enlargement- changes consistent w/ cardiac amyloidosis. He had a CMR w/ gadolinium for confirmation. LVEF 50%, moderate LVH, diffuse gadolinium enhancement and elevated T1 and T2 signals c/w cardiac amyloidosis pattern. He was formally established for amyloid care 03/12/2019 with Dr. Cheryl Flash.   At that time, he had evidence of chronic anemia for which he wears compression stockings.  He reported being unable to walk a city block, climb a flight of stairs and could barely perform basic ADLs without feeling significantly fatigued.  He reported that his symptoms began about 6 months prior.  He had no shortness of breath at rest, palpitations or chest pain.  He had complaints of lightheadedness with rapid positional changes.  His BP was moderately low.  He had a hospitalization 12/11/2018 for IV diuresis in which he underwent a thoracentesis x2 for recurrent bilateral pleural effusions.He has a chronic R effusion  He had started chemotherapy and received monthly IVIG post chemo treatments.  He was transitioned to daraumumab due to slow response.  His most responsive diuretic therapy appears to be torsemide 100 mg twice daily on M WF and 100 mg daily on all other days with K supplementation.  Past Medical History 1. Multiple myeloma 2. 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 Recurrent R pleural effusion   History reviewed. No pertinent surgical history.   Prior to Admission medications   Medication Sig Start Date End Date Taking? Authorizing Provider  acetaminophen (TYLENOL) 325 MG tablet Take 325 mg by mouth every 8 (eight) hours as needed for mild pain or headache.   Yes [provider]  acyclovir (ZOVIRAX) 400 MG tablet Take 400 mg by mouth 2 (two) times daily as needed (for outbreaks).   Yes [provider]  albuterol (PROVENTIL HFA) 108 (90 Base) MCG/ACT inhaler Inhale 2  puffs into the lungs every 6 (six) hours as needed for wheezing or shortness of breath.   Yes [provider]  aspirin EC 81 MG tablet Take 81 mg by mouth daily.   Yes [provider]  bortezomib IV (VELCADE) 3.5 MG injection Inject 1.3 mg/m2 into the vein every 14 (fourteen) days.   Yes [provider]  Cholecalciferol (VITAMIN D-3) 25 MCG (1000 UT) CAPS Take 1,000 Units by mouth daily.   Yes [provider]  cyclophosphamide (CYTOXAN) 50 MG capsule Take 600 mg by mouth every 14 (fourteen) days. Take with food to minimize GI upset. Take early in the day and maintain hydration.   Yes [provider]  dexamethasone (DECADRON) 4 MG tablet Take 10 mg by mouth every Friday.   Yes [provider]  fexofenadine (ALLEGRA) 180 MG tablet Take 180 mg by mouth daily.   Yes [provider]  magnesium oxide (MAG-OX) 400 MG tablet Take 800 mg by mouth 2 (two) times daily.   Yes [provider]  megestrol (MEGACE) 20 MG tablet Take 20 mg by mouth 4 (four) times daily.   Yes [provider]  Melatonin 3 MG TABS Take 3 mg by mouth at bedtime.   Yes [provider]  Multiple Vitamins-Minerals (ONE-A-DAY MENS 50+ ADVANTAGE) TABS Take  1 tablet by mouth daily with breakfast.    Yes [provider]  Omega-3 1000 MG CAPS Take 1,000 mg by mouth daily with breakfast.   Yes [provider]  ondansetron (ZOFRAN) 4 MG tablet Take 4 mg by mouth every 6 (six) hours as needed for nausea.   Yes [provider]  Polyvinyl Alcohol-Povidone (CLEAR EYES NATURAL TEARS) 5-6 MG/ML SOLN Place 1-2 drops into both eyes as needed (for dryness).   Yes [provider]  potassium chloride SA (K-DUR) 20 MEQ tablet Take 20-40 mEq by mouth See admin instructions. Take 20 mEq by mouth in the morning on Sun/Tues/Thurs/Sat and 40 mEq by mouth on Mon/Wed/Fri   Yes [provider]  torsemide (DEMADEX) 100 MG tablet Take  100 mg by mouth daily. Take 100 mg by mouth once a day on Sun/Tues/Thurs/Sat and 100 mg two times a day on Mon/Wed/Fri   Yes [provider]  Zinc Sulfate 220 (50 Zn) MG TABS Take 220 mg by mouth daily.   Yes [provider]    Inpatient Medications: Scheduled Meds:  amiodarone  150 mg Intravenous Once   aspirin  324 mg Oral NOW   Or   aspirin  300 mg Rectal NOW   chlorhexidine gluconate (MEDLINE KIT)  15 mL Mouth Rinse BID   Chlorhexidine Gluconate Cloth  6 each Topical Q0600   fentaNYL (SUBLIMAZE) injection  100 mcg Intravenous Once   insulin aspart  0-15 Units Subcutaneous Q4H   mouth rinse  15 mL Mouth Rinse 10 times per day   Continuous Infusions:  sodium chloride 10 mL/hr (04/24/19 1049)   amiodarone 30 mg/hr (04/24/19 1300)   dexmedetomidine     famotidine (PEPCID) IV 20 mg (04/24/19 1039)   fentaNYL infusion INTRAVENOUS 50 mcg/hr (04/24/19 0621)   heparin 1,150 Units/hr (04/24/19 1300)   magnesium sulfate bolus IVPB     norepinephrine (LEVOPHED) Adult infusion 7 mcg/min (04/24/19 1300)   PRN Meds: fentaNYL (SUBLIMAZE) injection, fentaNYL (SUBLIMAZE) injection, midazolam  Allergies:   No Known Allergies  Social History:   Social History   Socioeconomic History   Marital status: Married    Spouse name: Not on file   Number of children: Not on file   Years of education: Not on file   Highest education level: Not on file  Occupational History   Not on file  Social Needs   Financial resource strain: Not on file   Food insecurity:    Worry: Not on file    Inability: Not on file   Transportation needs:    Medical: Not on file    Non-medical: Not on file  Tobacco Use   Smoking status: Not on file  Substance and Sexual Activity   Alcohol use: Not on file   Drug use: Not on file   Sexual activity: Not on file  Lifestyle   Physical activity:    Days per week: Not on file    Minutes per session: Not on file    Stress: Not on file  Relationships   Social connections:    Talks on phone: Not on file    Gets together: Not on file    Attends religious service: Not on file    Active member of club or organization: Not on file    Attends meetings of clubs or organizations: Not on file    Relationship status: Not on file   Intimate partner violence:    Fear of current or ex partner:  Not on file    Emotionally abused: Not on file    Physically abused: Not on file    Forced sexual activity: Not on file  Other Topics Concern   Not on file  Social History Narrative   Not on file    Family History:    Denies FHx of premature CAD or SCD  Family Status:  No family status information on file.   Review of Systems: [y] = yes, [ ] = no    General: Weight gain []; Weight loss [ ]; Anorexia [ ]; Fatigue [y]; Fever [ ]; Chills [ ]; Weakness Blue.Reese ]   Cardiac: Chest pain/pressure [ ]; Resting SOB [ ]; Exertional SOB [y]; Orthopnea [ ]; Pedal Edema [y]; Palpitations [ y]; Syncope Blue.Reese ]; Presyncope [ ]; Paroxysmal nocturnal dyspnea[ ]   Pulmonary: Cough [ ]; Wheezing[ ]; Hemoptysis[ ]; Sputum [ ]; Snoring [ ]   GI: Vomiting[ ]; Dysphagia[ ]; Melena[ ]; Hematochezia [ ]; Heartburn[ ]; Abdominal pain [ ]; Constipation [ ]; Diarrhea [ ]; BRBPR [ ]   GU: Hematuria[ ]; Dysuria [ ]; Nocturia[ ]  Vascular: Pain in legs with walking [ ]; Pain in feet with lying flat [ ]; Non-healing sores [ ]; Stroke [ ]; TIA [ ]; Slurred speech [ ];   Neuro: Headaches[ ]; Vertigo[ ]; Seizures[ ]; Paresthesias[ ];Blurred vision [ ]; Diplopia [ ]; Vision changes [ ]   Ortho/Skin: Arthritis [y]; Joint pain [y]; Muscle pain [ ]; Joint swelling [ ]; Back Pain [ ]; Rash [ ]   Psych: Depression[ ]; Anxiety[ ]   Heme: Bleeding problems [ ]; Clotting disorders [ ]; Anemia [ ]   Endocrine: Diabetes [ ]; Thyroid dysfunction[ ]   Physical Exam/Data:   Vitals:   04/24/19 1215 04/24/19 1230 04/24/19 1245 04/24/19 1300   BP: 102/71 101/72 100/79 95/74  Pulse: 83 82 83 80  Resp: _0 Temp:      TempSrc:      SpO2: 100% 99% 100% 99%  Weight:      Height:        Intake/Output Summary (Last 24 hours) at 04/24/2019 1421 Last data filed at 04/24/2019 1300 Gross per 24 hour  Intake 1600.69 ml  Output 1540 ml  Net 60.69 ml   Filed Weights   04/23/19 1730 04/23/19 2250 04/24/19 0351  Weight: 70.6 kg 70.9 kg 70.9 kg   Body mass index is 21.2 kg/m.   General: Ill appearing, NAD Skin: Warm, dry, intact  Head: Normocephalic, atraumatic, clear, moist mucus membranes. Neck: Negative for carotid bruits. No JVD Lungs: Clear to ausculation bilaterally in upper lobes. No wheezes, rales, or rhonchi. Breathing is unlabored. Cardiovascular: RRR with S1 S2. No murmurs, rubs, gallops, or LV heave appreciated. Abdomen: Soft, non-tender, non-distended with normoactive bowel sounds. No hepatomegaly, No rebound/guarding. No obvious abdominal masses. MSK: Strength and tone appear normal for age. 5/5 in all extremities Extremities: No edema. No clubbing or cyanosis. DP/PT pulses 2+ bilaterally Neuro: Alert and oriented. No focal deficits. No facial asymmetry. MAE spontaneously. Psych: Responds to questions appropriately with normal affect.     EKG:  The EKG was personally reviewed and demonstrates: 04/24/2019 Junctional rhythm with Q waves in anterolateral leads versus NSR with 2nd degree AV block. No comparable EKG  Telemetry:  Telemetry was personally reviewed and demonstrates: 04/24/2019 Junctional with HR 70's   Relevant CV Studies:  Echocardiogram 04/24/2019:  1. The left ventricle has mildly  reduced systolic function, with an ejection fraction of 45-50%. The cavity size was normal. There is severe concentric left ventricular hypertrophy. Left ventricular diastolic function could not be evaluated due to  indeterminate diastolic function. Elevated left ventricular end-diastolic pressure No evidence of left  ventricular regional wall motion abnormalities.  2. The right ventricle has moderately reduced systolic function. The cavity was normal. There is no increase in right ventricular wall thickness. Right ventricular systolic pressure could not be assessed.  3. Right atriu is severely dilated. There is significant spontaneous echo contrast in the RA with no obvious thrombus.  4. Large pleural effusion in the left lateral region.  5. The pericardial effusion is posterior to the left ventricle.  6. Trivial pericardial effusion is present.  7. The inferior vena cava was dilated in size with <50% respiratory variability.  8. Given severe LVH, increased filling pressures and speckeled appearance of myocardium in addition to trace pericardial effusion, consider cardiac MRI to rulw out amyloidosis.   Cardiac MRI (11/20/2018) Conclusions: Moderate concentric left ventricular hypertrophy with low normal systolic function. Diffuse late gadolinium enhancement, and T1 and T2 signals elevated, with difficulty nulling the myocardium. Pattern is consistent with cardiac amyloidosis.   Laboratory Data:  Chemistry Recent Labs  Lab 04/23/19 1535 04/23/19 1604 04/23/19 2048 04/24/19 0302  NA 136 134* 132* 137  K 3.2* 2.6* 3.4* 3.7  CL 89*  --   --  94*  CO2 33*  --   --  32  GLUCOSE 126*  --   --  135*  BUN 46*  --   --  37*  CREATININE 1.28*  --   --  1.13  CALCIUM 8.8*  --   --  8.4*  GFRNONAA >60  --   --  >60  GFRAA >60  --   --  >60  ANIONGAP 14  --   --  11    Total Protein  Date Value Ref Range Status  04/24/2019 5.3 (L) 6.5 - 8.1 g/dL Final   Albumin  Date Value Ref Range Status  04/24/2019 2.0 (L) 3.5 - 5.0 g/dL Final   AST  Date Value Ref Range Status  04/24/2019 31 15 - 41 U/L Final   ALT  Date Value Ref Range Status  04/24/2019 48 (H) 0 - 44 U/L Final   Alkaline Phosphatase  Date Value Ref Range Status  04/24/2019 110 38 - 126 U/L Final   Total Bilirubin  Date Value Ref Range  Status  04/24/2019 0.6 0.3 - 1.2 mg/dL Final   Hematology Recent Labs  Lab 04/23/19 1535 04/23/19 1604 04/23/19 2048 04/24/19 0302  WBC 5.8  --   --  7.0  RBC 3.55*  --   --  3.40*  HGB 13.8 11.2* 11.6* 12.9*  HCT 39.3 33.0* 34.0* 37.2*  MCV 110.7*  --   --  109.4*  MCH 38.9*  --   --  37.9*  MCHC 35.1  --   --  34.7  RDW 15.2  --   --  15.4  PLT 250  --   --  274   Cardiac Enzymes Recent Labs  Lab 04/23/19 1535  TROPONINI 0.25*   No results for input(s): TROPIPOC in the last 168 hours.  BNP Recent Labs  Lab 04/23/19 1537  BNP 2,502.3*    DDimer No results for input(s): DDIMER in the last 168 hours. TSH: No results found for: TSH Lipids:No results found for: CHOL, HDL, LDLCALC, LDLDIRECT, TRIG, CHOLHDL HgbA1c:No  results found for: HGBA1C  Radiology/Studies:  Ct Head Wo Contrast  Result Date: 04/23/2019 CLINICAL DATA:  Shortness of breath, became unresponsive on the way home from heart failure clinic. EXAM: CT HEAD WITHOUT CONTRAST TECHNIQUE: Contiguous axial images were obtained from the base of the skull through the vertex without intravenous contrast. COMPARISON:  None. FINDINGS: Brain: The brainstem, cerebellum, cerebral peduncles, thalami, basal ganglia, basilar cisterns, and ventricular system appear within normal limits. No intracranial hemorrhage, mass lesion, or acute CVA. Vascular: Unremarkable Skull: Left mastoidectomy.  Gas tracks along the left facial Sinuses/Orbits: Left mastoidectomy. The remaining paranasal sinuses appear clear. Other: There is gas tracking along the left and to a lesser extent pterygoid musculature along the left masseter muscle and lateral to the left mandibular condyle. Air fluid level in the nasopharynx. Patient is orally intubated. IMPRESSION: 1. No acute intracranial findings. 2. There is gas tracking along the margins the left pterygoid muscle and along the posterior margin of the left masseter muscle, cause uncertain, but conceivably  related to intubation. I do not see adjacent facial fractures. Patient does have a left mastoidectomy. Electronically Signed   By: Van Clines M.D.   On: 04/23/2019 18:56   Ct Chest W Contrast  Addendum Date: 04/23/2019   ADDENDUM REPORT: 04/23/2019 19:10 ADDENDUM: The original report was by Dr. Van Clines. The following addendum is by Dr. Van Clines: Critical Value/emergent results were called by telephone at the time of interpretation on 04/23/2019 at 7:10 pm to Dr. Laverta Baltimore, who verbally acknowledged these results. Electronically Signed   By: Van Clines M.D.   On: 04/23/2019 19:10   Result Date: 04/23/2019 CLINICAL DATA:  Shortness of breath, patient became unresponsive after leaving heart failure clinic. EXAM: CT CHEST WITH CONTRAST TECHNIQUE: Multidetector CT imaging of the chest was performed during intravenous contrast administration. CONTRAST:  9m OMNIPAQUE IOHEXOL 300 MG/ML  SOLN COMPARISON:  04/23/2019 chest radiograph FINDINGS: Cardiovascular: Mild cardiomegaly. Left ventricular hypertrophy with the interventricular septal thickness of 2.2 cm. Today's exam was not performed as a CT angiogram. Nevertheless, there is discernible filling defect compatible with thrombus in the left upper lobe pulmonary artery into a lesser extent in the right lower lobe pulmonary artery as shown for example on images 79 through 87 of series 3. Clot burden is small to moderate. Mediastinum/Nodes: Diffuse edema in the mediastinal adipose tissues. Endotracheal tube satisfactorily positioned. Nasogastric tube enters the stomach. Lungs/Pleura: Large bilateral pleural effusions, over 50% of hemithoracic volume bilaterally. Secondary pulmonary lobular interstitial accentuation especially in the lung apices compatible with pulmonary edema. Extensive passive atelectasis. Upper Abdomen: Edema in the upper abdominal adipose tissue compatible with widespread third spacing. Musculoskeletal: Diffuse  subcutaneous edema compatible with widespread third spacing of fluid. Age indeterminate superior endplate wedging at TH06 Vacuum disc phenomenon at T9-10. IMPRESSION: 1. Acute pulmonary embolus involving the right upper lobe and right lower lobe. Clot burden is small to moderate. Positive for acute PE with CT evidence of right heart strain (RV/LV Ratio = 1.1) consistent with at least submassive (intermediate risk) PE. The presence of right heart strain has been associated with an increased risk of morbidity and mortality. Please activate Code PE by paging [phone] 2. Left ventricular hypertrophy and mild cardiomegaly. 3. Large bilateral pleural effusions with passive atelectasis. Only a minority of the lung is aerated. This may reduce overall respiratory reserve. 4. Third spacing of fluid with edema in mediastinal and subcutaneous tissues. Secondary pulmonary lobular interstitial accentuation in the lungs compatible with interstitial pulmonary  edema. 5. Age-indeterminate mild superior endplate compression at T10. Radiology assistant personnel have been notified to put me in telephone contact with the referring physician or the referring physician's clinical representative in order to discuss these findings. Once this communication is established I will issue an addendum to this report for documentation purposes. Electronically Signed: By: Van Clines M.D. On: 04/23/2019 19:07   Dg Chest Port 1 View  Result Date: 04/24/2019 CLINICAL DATA:  Respiratory failure EXAM: PORTABLE CHEST 1 VIEW COMPARISON:  04/23/2019 FINDINGS: Cardiac shadow is stable. Endotracheal tube, gastric catheter and left subclavian central line are again noted and stable. Bilateral pleural effusions are noted stable in appearance when compare with the prior exam. Mild vascular congestion remains. IMPRESSION: Stable bilateral pleural effusions with vascular congestion. Electronically Signed   By: Inez Catalina M.D.   On: 04/24/2019  07:50   Dg Chest Portable 1 View  Result Date: 04/23/2019 CLINICAL DATA:  Catheter placement EXAM: PORTABLE CHEST 1 VIEW COMPARISON:  04/23/2019 FINDINGS: Endotracheal tube terminates above the carina by approximately 6 cm. The left-sided central venous catheter is well positioned with tip terminating near the cavoatrial junction. The enteric tube extends below the left hemidiaphragm. Large bilateral pleural effusions are again noted. No pneumothorax. Atelectasis is noted bilaterally. IMPRESSION: 1. Lines and tubes as above.  No pneumothorax. 2. Persistent large bilateral pleural effusions. Electronically Signed   By: Constance Holster M.D.   On: 04/23/2019 20:57   Dg Chest Portable 1 View  Result Date: 04/23/2019 CLINICAL DATA:  Hypoxia EXAM: PORTABLE CHEST 1 VIEW COMPARISON:  None. FINDINGS: Endotracheal tube tip is 6.2 cm above the carina. Nasogastric tube tip and side port are below the diaphragm. No evident pneumothorax. There are pleural effusions bilaterally. There is consolidation in the left base. There is mild interstitial edema throughout the lungs bilaterally. Heart is upper normal in size. The pulmonary vascularity is normal. No adenopathy appreciable. No bone lesions. IMPRESSION: Tube positions as described without pneumothorax. Layering pleural effusion on the right. Apparent consolidation with effusion left base. Pneumonia and/or aspiration suspected in left base. There is interstitial prominence throughout the lungs which may represent noncardiogenic edema or possibly atypical infection. Heart upper normal in size.  No adenopathy appreciable. Electronically Signed   By: Lowella Grip III M.D.   On: 04/23/2019 16:04   Assessment and Plan:   1.  Cardiac amyloidosis with renal involvement: -Initially dx after after progressive fatigue found to have increasing IgM kappa chain (04/2018). Fat pad and bone marrow biopsy performed 10/31/18 was Congo red positivity in both. Renal biopsy  (11/02/18) was also positive for Congo red. Repeat echocardiogram done 10/27/18 was dramatically different from the 02/01/2017 study. There was new moderate LVH w/ sparkly pattern, LV strain abnormality, trivial pericardial effusion, biatrial enlargement consistent w/ cardiac amyloidosis. He had a CMR w/ gadolinium for confirmation. Followed closely by Dr. Cheryl Flash with [facility], last seen 04/23/2019 (notes not available for review). -Baseline target weight appears to be approximately 150lb -Per outpatient note, fluid volume managed with torsremide 100 mg bid M-W-F and 100 mg daily on other days. -Treated by hematology/oncology with cyclophosphamide, Velcade, dexamethasone -Echocardiogram performed 04/24/2019 with LVEF of 45 to 50% with severe concentric left ventricular hypertrophy, severely dilated RA with significant spontaneous echo contrast in the RA with no obvious thrombus and increased filling pressures and speckeled appearance of myocardium in addition to trace pericardial effusion -BNP 2500 on presentation -Weight, 155lb on 04/23/2019, 156lb today -I&O, +114 mL -Has received a toal  of 70m IV Lasix with poor UO response  -Will review plan with rounding MD  2.  Acute bilateral pulmonary embolism: -[facility] a sudden collapse found to have a sub-massive PE per chest CT -Intubated on arrival secondary to hypoxia -Managed per PCCM -Continue heparin gtt per pharmacy without TPA  3.  Acute hypoxic respiratory failure with pleural effusions: -Intubated per CCM on presentation given hypoxia -Management per PCCM  4.  Hypotension: -Currently on vasopressor support for a map goal of >68mg -Stable, mentating well   5.  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  History of chronic hypokalemia: -K+, 3.4 on presentation>> improved to 3.7  today -Repleted per primary team  7.  VT: -Documented VT on hospital arrival>>placed on amiodarone infusion -Keep Mg+ greater than 2.0 and K+ greater than 4.0 -Will obtain repeat EKG to confirm rhythm. Tele review appears to have accelerated junctional versus second degree AV block now     For questions or updates, please contact [facility] consult [email or web address] for contact info under Cardiology/STEMI.   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. Admitted earlier this year and had good diuresis but has struggled with chronic R pleural effusion and progressive HF symptoms.   Admitted with syncope/transient arrest. Found to have small to moderate bilateral PE (LUL and RLL) with massive bilateral pleural effusions. Intubated in ER for hypotension and hypoxia. Started on heparin. Felt to also have possible VT in ER but no strips available for review. Started on amio.   Now awake on vent on NE at 8. CVP 10.   Echo with EF 45-50% severe LVH RV moderately down with mild RV strain. No significant TR jet to assess RVSP   On exam Ill appearing. Awake on vent HEENT: normal multiple small hemorrhages + ETT Neck: supple. JVP 10. Carotids 2+ bilat; no bruits. No lymphadenopathy or thryomegaly appreciated. Cor: PMI nondisplaced. Regular rate & rhythm. +RV lift  Left subclavian central line Lungs: markedly decreased 1/2 up bilaterally Abdomen: soft, nontender, +distended. No hepatosplenomegaly. No bruits or masses. Good bowel sounds. Extremities: no cyanosis, clubbing, rash,1+ edema no cords Neuro: alert follows commands cranial nerves grossly intact. moves all 4 extremities w/o difficulty. Affect pleasant  Tele: NSR with very long 1AVB  [age]/o male with advanced restrictive CM in  setting of AL amyloid now admitted with respiratory arrest and found to have small to moderate bilateral PE with very large bilateral pleural effusions. I am not convinced that acute PE are the actual trigger here and inciting event may have been hypoxemia due to large effusions.   Recs:  1) Continue NE support for now. (would not switch to vasopressin as he likely need inotropic support) 2)  With CVP 10  would avoid diuretics just yet. He is very preload dependent with restrictive CM and acute PE 3) Check co-ox. 4) Check LE dopplers looking for degree of residual clot 5) Continue heparin 6) Will almost certainly need bilateral thoracentesis to liberate from vent 7) Can hold amio at this time particualrly with marked 1AVB. Watch on monitor. Keep K > 4.0 Mg > 2.0 8) If AVB doesn't improved  with treatment of PE may need to consider PPM at some point (not uncommon with amyloid)  I updated Dr. Cheryl Flash @ [facility] personally.   CRITICAL CARE Performed by: Glori Bickers  Total critical care time: 45 minutes  Critical care time was exclusive of separately billable procedures and treating other patients.  Critical care was necessary to treat or prevent imminent or life-threatening deterioration.  Critical care was time spent personally by me (independent of midlevel providers or residents) on the following activities: development of treatment plan with patient and/or surrogate as well as nursing, discussions with consultants, evaluation of patient's response to treatment, examination of patient, obtaining history from patient or surrogate, ordering and performing treatments and interventions, ordering and review of laboratory studies, ordering and review of radiographic studies, pulse oximetry and re-evaluation of patient's condition.  Glori Bickers, MD  9:00 PM

## 2019-04-24 NOTE — Progress Notes (Signed)
Assisted tele visit to patient with family member.  Keandria Berrocal, Cindra Presume, RN,BSN,CCRN

## 2019-04-24 NOTE — Progress Notes (Signed)
ANTICOAGULATION CONSULT NOTE   Pharmacy Consult for Heparin Indication: pulmonary embolus  No Known Allergies  Patient Measurements: Height: 6' (182.9 cm) Weight: 156 lb 4.9 oz (70.9 kg) IBW/kg (Calculated) : 77.6  Heparin dosing weight: 70.6kg  Vital Signs: Temp: 98.3 F (36.8 C) (06/09 0326) Temp Source: Oral (06/09 0326) BP: 94/73 (06/09 0300) Pulse Rate: 74 (06/09 0300)  Labs: Recent Labs    04/23/19 1535 04/23/19 1604 04/23/19 2048 04/24/19 0302  HGB 13.8 11.2* 11.6* 12.9*  HCT 39.3 33.0* 34.0* 37.2*  PLT 250  --   --  274  LABPROT 13.4  --   --   --   INR 1.0  --   --   --   HEPARINUNFRC  --   --   --  0.50  CREATININE 1.28*  --   --  1.13  TROPONINI 0.25*  --   --   --     Estimated Creatinine Clearance: 70.6 mL/min (by C-G formula based on SCr of 1.13 mg/dL).   Medical History: History reviewed. No pertinent past medical history.  Assessment: Erik Costa is a 58yo male admitted with pulmonary embolism. Pharmacy consulted to start heparin infusion. Chest CT: Acute pulmonary embolus involving RUL and RLL. Clot burden is small to moderate. Positive for acute PE with CT evidence of right heart strain. RV/LV Ratio = 1.1. Reviewed outpatient notes from [facility] and patient not on prior anticoagulation. Hgb 11.2 and pltc 250.  6/9 AM update: initial heparin level is therapeutic, Hgb 12.9  Goal of Therapy:  Heparin level 0.3-0.7 units/ml Monitor platelets by anticoagulation protocol: Yes   Plan:  Cont heparin at 1150 units/hr 1200 heparin level Monitor daily heparin level, CBC, s/sx of bleeding  Narda Bonds, PharmD, BCPS Clinical Pharmacist Phone: [phone]

## 2019-04-24 NOTE — Progress Notes (Signed)
I have called patient's wife and updated her in full.   Magdalen Spatz, AGACNP-BC [place] Pager # [phone]  After 4 pm call [phone] 04/24/2019 11:52 AM

## 2019-04-24 NOTE — Progress Notes (Signed)
Assisted tele visit to patient with wife.  Maryelizabeth Rowan, RN

## 2019-04-25 ENCOUNTER — Inpatient Hospital Stay (HOSPITAL_COMMUNITY): Payer: Managed Care, Other (non HMO)

## 2019-04-25 DIAGNOSIS — R570 Cardiogenic shock: Secondary | ICD-10-CM

## 2019-04-25 DIAGNOSIS — I2699 Other pulmonary embolism without acute cor pulmonale: Secondary | ICD-10-CM

## 2019-04-25 DIAGNOSIS — J9601 Acute respiratory failure with hypoxia: Secondary | ICD-10-CM

## 2019-04-25 DIAGNOSIS — I2601 Septic pulmonary embolism with acute cor pulmonale: Secondary | ICD-10-CM

## 2019-04-25 LAB — COMPREHENSIVE METABOLIC PANEL
ALT: 37 U/L (ref 0–44)
AST: 22 U/L (ref 15–41)
Albumin: 1.9 g/dL — ABNORMAL LOW (ref 3.5–5.0)
Alkaline Phosphatase: 98 U/L (ref 38–126)
Anion gap: 11 (ref 5–15)
BUN: 38 mg/dL — ABNORMAL HIGH (ref 6–20)
CO2: 31 mmol/L (ref 22–32)
Calcium: 8.8 mg/dL — ABNORMAL LOW (ref 8.9–10.3)
Chloride: 95 mmol/L — ABNORMAL LOW (ref 98–111)
Creatinine, Ser: 1.5 mg/dL — ABNORMAL HIGH (ref 0.61–1.24)
GFR calc Af Amer: 58 mL/min — ABNORMAL LOW (ref >60)
GFR calc non Af Amer: 50 mL/min — ABNORMAL LOW (ref >60)
Glucose, Bld: 126 mg/dL — ABNORMAL HIGH (ref 70–99)
Potassium: 4.2 mmol/L (ref 3.5–5.1)
Sodium: 137 mmol/L (ref 135–145)
Total Bilirubin: 0.6 mg/dL (ref 0.3–1.2)
Total Protein: 5.5 g/dL — ABNORMAL LOW (ref 6.5–8.1)

## 2019-04-25 LAB — GLUCOSE, CAPILLARY
Glucose-Capillary: 100 mg/dL — ABNORMAL HIGH (ref 70–99)
Glucose-Capillary: 107 mg/dL — ABNORMAL HIGH (ref 70–99)
Glucose-Capillary: 110 mg/dL — ABNORMAL HIGH (ref 70–99)
Glucose-Capillary: 125 mg/dL — ABNORMAL HIGH (ref 70–99)
Glucose-Capillary: 125 mg/dL — ABNORMAL HIGH (ref 70–99)
Glucose-Capillary: 136 mg/dL — ABNORMAL HIGH (ref 70–99)

## 2019-04-25 LAB — COOXEMETRY PANEL
Carboxyhemoglobin: 1.3 % (ref 0.5–1.5)
Methemoglobin: 0.6 % (ref 0.0–1.5)
O2 Saturation: 52.3 %
Total hemoglobin: 14 g/dL (ref 12.0–16.0)

## 2019-04-25 LAB — HEPARIN LEVEL (UNFRACTIONATED): Heparin Unfractionated: 0.41 IU/mL (ref 0.30–0.70)

## 2019-04-25 LAB — BRAIN NATRIURETIC PEPTIDE: B Natriuretic Peptide: 2888.1 pg/mL — ABNORMAL HIGH (ref 0.0–100.0)

## 2019-04-25 LAB — CBC
HCT: 39.2 % (ref 39.0–52.0)
Hemoglobin: 13.6 g/dL (ref 13.0–17.0)
MCH: 38.6 pg — ABNORMAL HIGH (ref 26.0–34.0)
MCHC: 34.7 g/dL (ref 30.0–36.0)
MCV: 111.4 fL — ABNORMAL HIGH (ref 80.0–100.0)
Platelets: 271 10*3/uL (ref 150–400)
RBC: 3.52 MIL/uL — ABNORMAL LOW (ref 4.22–5.81)
RDW: 15.9 % — ABNORMAL HIGH (ref 11.5–15.5)
WBC: 5.6 10*3/uL (ref 4.0–10.5)
nRBC: 0 % (ref 0.0–0.2)

## 2019-04-25 LAB — MAGNESIUM: Magnesium: 2 mg/dL (ref 1.7–2.4)

## 2019-04-25 MED ORDER — PRO-STAT SUGAR FREE PO LIQD: 30.0000 mL | Freq: Every day | ORAL | Status: DC

## 2019-04-25 MED ORDER — FUROSEMIDE 10 MG/ML IJ SOLN
40.0000 mg | Freq: Once | INTRAMUSCULAR | Status: AC
  Administered 2019-04-25: 40 mg via INTRAVENOUS
  Filled 2019-04-25: qty 4

## 2019-04-25 MED ORDER — VITAL AF 1.2 CAL PO LIQD: 1000.0000 mL | ORAL | Status: DC

## 2019-04-25 MED ORDER — ORAL CARE MOUTH RINSE
15.0000 mL | Freq: Two times a day (BID) | OROMUCOSAL | Status: DC
  Administered 2019-04-25 – 2019-05-02 (×9): 15 mL via OROMUCOSAL

## 2019-04-25 NOTE — Progress Notes (Signed)
Advanced Heart Failure Rounding Note   Subjective:    Extubated this am  NE at 6 this am but now off. Co-ox 52%  Creatinine up to 1.5  Rhythm stable. Off amio  CXR with moderate to large bilateral effusions and pulmonary edema.    Objective:   Weight Range:  Vital Signs:   Temp:  [98.2 F (36.8 C)-98.5 F (36.9 C)] 98.2 F (36.8 C) (06/10 0700) Pulse Rate:  [71-98] 80 (06/10 0830) Resp:  [5-25] 6 (06/10 0830) BP: (78-135)/(59-125) 98/72 (06/10 0830) SpO2:  [89 %-100 %] 95 % (06/10 0830) FiO2 (%):  [40 %-50 %] 40 % (06/10 0803) Weight:  [66.2 kg] 66.2 kg (06/10 0431) Last BM Date: (PTA)  Weight change: Filed Weights   04/23/19 2250 04/24/19 0351 04/25/19 0431  Weight: 70.9 kg 70.9 kg 66.2 kg    Intake/Output:   Intake/Output Summary (Last 24 hours) at 04/25/2019 0856 Last data filed at 04/25/2019 0800 Gross per 24 hour  Intake 1780.7 ml  Output 1125 ml  Net 655.7 ml     Physical Exam: General:  Now extubated. Sitting up in bed  HEENT: normal + ETT multiple ecchymosis Neck: supple. JVP to jaw. Carotids 2+ bilat; no bruits. No lymphadenopathy or thryomegaly appreciated. Cor: PMI nondisplaced. Regular rate & rhythm. No rubs, gallops or murmurs. L subclav TLC Lungs: dull 1/2 up B Abdomen: soft, nontender, nondistended. No hepatosplenomegaly. No bruits or masses. Good bowel sounds. Extremities: no cyanosis, clubbing, rash, trace edema Neuro: alert & orientedx3, cranial nerves grossly intact. moves all 4 extremities w/o difficulty. Affect pleasant  Telemetry: Sinus with marked 1AVB  Labs: Basic Metabolic Panel: Recent Labs  Lab 04/23/19 1535 04/23/19 1604 04/23/19 1633 04/23/19 2048 04/24/19 0302 04/24/19 0924 04/25/19 0403  NA 136 134*  --  132* 137  --  137  K 3.2* 2.6*  --  3.4* 3.7  --  4.2  CL 89*  --   --   --  94*  --  95*  CO2 33*  --   --   --  32  --  31  GLUCOSE 126*  --   --   --  135*  --  126*  BUN 46*  --   --   --  37*  --  38*   CREATININE 1.28*  --   --   --  1.13  --  1.50*  CALCIUM 8.8*  --   --   --  8.4*  --  8.8*  MG  --   --  2.1  --   --  1.9 2.0    Liver Function Tests: Recent Labs  Lab 04/23/19 1535 04/24/19 0302 04/25/19 0403  AST 44* 31 22  ALT 53* 48* 37  ALKPHOS 114 110 98  BILITOT 0.7 0.6 0.6  PROT 5.9* 5.3* 5.5*  ALBUMIN 2.3* 2.0* 1.9*   No results for input(s): LIPASE, AMYLASE in the last 168 hours. No results for input(s): AMMONIA in the last 168 hours.  CBC: Recent Labs  Lab 04/23/19 1535 04/23/19 1604 04/23/19 2048 04/24/19 0302 04/25/19 0403  WBC 5.8  --   --  7.0 5.6  NEUTROABS 4.5  --   --   --   --   HGB 13.8 11.2* 11.6* 12.9* 13.6  HCT 39.3 33.0* 34.0* 37.2* 39.2  MCV 110.7*  --   --  109.4* 111.4*  PLT 250  --   --  274 271    Cardiac  Enzymes: Recent Labs  Lab 04/23/19 1535  TROPONINI 0.25*    BNP: BNP (last 3 results) Recent Labs    04/23/19 1537 04/25/19 0404  BNP 2,502.3* 2,888.1*    ProBNP (last 3 results) No results for input(s): PROBNP in the last 8760 hours.    Other results:  Imaging: Ct Head Wo Contrast  Result Date: 04/23/2019 CLINICAL DATA:  Shortness of breath, became unresponsive on the way home from heart failure clinic. EXAM: CT HEAD WITHOUT CONTRAST TECHNIQUE: Contiguous axial images were obtained from the base of the skull through the vertex without intravenous contrast. COMPARISON:  None. FINDINGS: Brain: The brainstem, cerebellum, cerebral peduncles, thalami, basal ganglia, basilar cisterns, and ventricular system appear within normal limits. No intracranial hemorrhage, mass lesion, or acute CVA. Vascular: Unremarkable Skull: Left mastoidectomy.  Gas tracks along the left facial Sinuses/Orbits: Left mastoidectomy. The remaining paranasal sinuses appear clear. Other: There is gas tracking along the left and to a lesser extent pterygoid musculature along the left masseter muscle and lateral to the left mandibular condyle. Air fluid  level in the nasopharynx. Patient is orally intubated. IMPRESSION: 1. No acute intracranial findings. 2. There is gas tracking along the margins the left pterygoid muscle and along the posterior margin of the left masseter muscle, cause uncertain, but conceivably related to intubation. I do not see adjacent facial fractures. Patient does have a left mastoidectomy. Electronically Signed   By: Van Clines M.D.   On: 04/23/2019 18:56   Ct Chest W Contrast  Addendum Date: 04/23/2019   ADDENDUM REPORT: 04/23/2019 19:10 ADDENDUM: The original report was by Dr. Van Clines. The following addendum is by Dr. Van Clines: Critical Value/emergent results were called by telephone at the time of interpretation on 04/23/2019 at 7:10 pm to Dr. Laverta Baltimore, who verbally acknowledged these results. Electronically Signed   By: Van Clines M.D.   On: 04/23/2019 19:10   Result Date: 04/23/2019 CLINICAL DATA:  Shortness of breath, patient became unresponsive after leaving heart failure clinic. EXAM: CT CHEST WITH CONTRAST TECHNIQUE: Multidetector CT imaging of the chest was performed during intravenous contrast administration. CONTRAST:  66m OMNIPAQUE IOHEXOL 300 MG/ML  SOLN COMPARISON:  04/23/2019 chest radiograph FINDINGS: Cardiovascular: Mild cardiomegaly. Left ventricular hypertrophy with the interventricular septal thickness of 2.2 cm. Today's exam was not performed as a CT angiogram. Nevertheless, there is discernible filling defect compatible with thrombus in the left upper lobe pulmonary artery into a lesser extent in the right lower lobe pulmonary artery as shown for example on images 79 through 87 of series 3. Clot burden is small to moderate. Mediastinum/Nodes: Diffuse edema in the mediastinal adipose tissues. Endotracheal tube satisfactorily positioned. Nasogastric tube enters the stomach. Lungs/Pleura: Large bilateral pleural effusions, over 50% of hemithoracic volume bilaterally. Secondary pulmonary  lobular interstitial accentuation especially in the lung apices compatible with pulmonary edema. Extensive passive atelectasis. Upper Abdomen: Edema in the upper abdominal adipose tissue compatible with widespread third spacing. Musculoskeletal: Diffuse subcutaneous edema compatible with widespread third spacing of fluid. Age indeterminate superior endplate wedging at TF74 Vacuum disc phenomenon at T9-10. IMPRESSION: 1. Acute pulmonary embolus involving the right upper lobe and right lower lobe. Clot burden is small to moderate. Positive for acute PE with CT evidence of right heart strain (RV/LV Ratio = 1.1) consistent with at least submassive (intermediate risk) PE. The presence of right heart strain has been associated with an increased risk of morbidity and mortality. Please activate Code PE by paging [phone] 2. Left ventricular hypertrophy and  mild cardiomegaly. 3. Large bilateral pleural effusions with passive atelectasis. Only a minority of the lung is aerated. This may reduce overall respiratory reserve. 4. Third spacing of fluid with edema in mediastinal and subcutaneous tissues. Secondary pulmonary lobular interstitial accentuation in the lungs compatible with interstitial pulmonary edema. 5. Age-indeterminate mild superior endplate compression at T10. Radiology assistant personnel have been notified to put me in telephone contact with the referring physician or the referring physician's clinical representative in order to discuss these findings. Once this communication is established I will issue an addendum to this report for documentation purposes. Electronically Signed: By: Van Clines M.D. On: 04/23/2019 19:07   Dg Chest Port 1 View  Result Date: 04/25/2019 CLINICAL DATA:  Respiratory failure EXAM: PORTABLE CHEST 1 VIEW COMPARISON:  04/24/2019 FINDINGS: Cardiac shadow is stable. Endotracheal tube, gastric catheter and left subclavian central line are noted in satisfactory position.  Bilateral pleural effusions are noted with underlying atelectatic changes. The overall appearance is stable from the prior exam. Vascular congestion remains as well. IMPRESSION: Changes of CHF with effusions and bibasilar atelectasis. Electronically Signed   By: Inez Catalina M.D.   On: 04/25/2019 08:06   Dg Chest Port 1 View  Result Date: 04/24/2019 CLINICAL DATA:  Respiratory failure EXAM: PORTABLE CHEST 1 VIEW COMPARISON:  04/23/2019 FINDINGS: Cardiac shadow is stable. Endotracheal tube, gastric catheter and left subclavian central line are again noted and stable. Bilateral pleural effusions are noted stable in appearance when compare with the prior exam. Mild vascular congestion remains. IMPRESSION: Stable bilateral pleural effusions with vascular congestion. Electronically Signed   By: Inez Catalina M.D.   On: 04/24/2019 07:50   Dg Chest Portable 1 View  Result Date: 04/23/2019 CLINICAL DATA:  Catheter placement EXAM: PORTABLE CHEST 1 VIEW COMPARISON:  04/23/2019 FINDINGS: Endotracheal tube terminates above the carina by approximately 6 cm. The left-sided central venous catheter is well positioned with tip terminating near the cavoatrial junction. The enteric tube extends below the left hemidiaphragm. Large bilateral pleural effusions are again noted. No pneumothorax. Atelectasis is noted bilaterally. IMPRESSION: 1. Lines and tubes as above.  No pneumothorax. 2. Persistent large bilateral pleural effusions. Electronically Signed   By: Constance Holster M.D.   On: 04/23/2019 20:57   Dg Chest Portable 1 View  Result Date: 04/23/2019 CLINICAL DATA:  Hypoxia EXAM: PORTABLE CHEST 1 VIEW COMPARISON:  None. FINDINGS: Endotracheal tube tip is 6.2 cm above the carina. Nasogastric tube tip and side port are below the diaphragm. No evident pneumothorax. There are pleural effusions bilaterally. There is consolidation in the left base. There is mild interstitial edema throughout the lungs bilaterally. Heart is upper  normal in size. The pulmonary vascularity is normal. No adenopathy appreciable. No bone lesions. IMPRESSION: Tube positions as described without pneumothorax. Layering pleural effusion on the right. Apparent consolidation with effusion left base. Pneumonia and/or aspiration suspected in left base. There is interstitial prominence throughout the lungs which may represent noncardiogenic edema or possibly atypical infection. Heart upper normal in size.  No adenopathy appreciable. Electronically Signed   By: Lowella Grip III M.D.   On: 04/23/2019 16:04      Medications:     Scheduled Medications: . amiodarone  150 mg Intravenous Once  . chlorhexidine gluconate (MEDLINE KIT)  15 mL Mouth Rinse BID  . Chlorhexidine Gluconate Cloth  6 each Topical Q0600  . fentaNYL (SUBLIMAZE) injection  100 mcg Intravenous Once  . insulin aspart  0-15 Units Subcutaneous Q4H  . mouth rinse  15 mL Mouth Rinse 10 times per day     Infusions: . sodium chloride 10 mL/hr at 04/25/19 0800  . amiodarone 30 mg/hr (04/25/19 0800)  . dexmedetomidine    . famotidine (PEPCID) IV Stopped (04/24/19 2154)  . feeding supplement (VITAL AF 1.2 CAL) 20 mL/hr at 04/25/19 0800  . fentaNYL infusion INTRAVENOUS 75 mcg/hr (04/25/19 0800)  . heparin 1,150 Units/hr (04/25/19 0800)  . norepinephrine (LEVOPHED) Adult infusion 6 mcg/min (04/25/19 0800)     PRN Medications:  fentaNYL (SUBLIMAZE) injection, fentaNYL (SUBLIMAZE) injection, midazolam   Assessment/Plan:   1. Acute hypoxic respiratory failure - likely multifactorial due to acute PE, HF and large bilateral effusions - CCM managing.  - With RV strain need to be careful with diuresis as he is preload dependent - Can start lasix 40 IV bid and see how he responds - Will likely need thora  2. 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 Now off NE. Need to follow closely. Can consider dobutamine as needed.   3.  Acute bilateral pulmonary embolism: - Continue heparin -> coumadin - Managed per PCCM - u/s LE: findings consistent with age indeterminate deep vein thrombosis involving the left peroneal vein  4.  Hypotension: -Currently on vasopressor support for a map goal of >20mHg -Wean as tolerated. Use NE. Would not increase afterload with neo.   5.  VT - occurred in setting of respiratory distress. Now quiescent on IV amio - Rhythm stable but has very marked AV block (not uncommon with amyloid) - Stop amio - Follow on tele. Keep K> 4.0 Mg > 2.0  6. Pleural effusions - this has been chronic problem for him R>L - doubt we can diurese him enough to improve these - would consider thora. Risk of holding heparin for procedure not prohibitive and benefit of removing effusions likely will be key to avoid re-intubation,   7. 1st degree AV block > 3074m- stop amio. follow  8. Multiple myeloma with AL amyloidosis - therapy currently on hold - may need stress dose steroids   CRITICAL CARE Performed by: BeGlori BickersTotal critical care time: 35 minutes  Critical care time was exclusive of separately billable procedures and treating other patients.  Critical care was necessary to treat or prevent imminent or life-threatening deterioration.  Critical care was time spent personally by me (independent of midlevel providers or residents) on the following activities: development of treatment plan with patient and/or surrogate as well as nursing, discussions with consultants, evaluation of patient's  response to treatment, examination of patient, obtaining history from patient or surrogate, ordering and performing treatments and interventions, ordering and review of laboratory studies, ordering and review of radiographic studies, pulse oximetry and re-evaluation of patient's condition.      Length of Stay: 2   DaGlori BickersD 04/25/2019, 8:56 AM  Advanced Heart Failure Team Pager [phone]-F; [place] Please contact [facility] for night-coverage after hours (4p -7a ) and weekends on [email or web address]

## 2019-04-25 NOTE — Progress Notes (Addendum)
NAME:  Erik Costa, MRN:  [id number], DOB:  November 10, 1961, LOS: 2 ADMISSION DATE:  04/23/2019, CONSULTATION DATE: 04/23/2019 REFERRING MD:  ER, CHIEF COMPLAINT: Sudden syncope  Brief History   Patient is a 58 year old with systemic amyloidosis treated at [facility] to the emergency room today with sudden collapse found to have pulmonary embolus on CT scan of the chest.  History of present illness   She is a 58 year old diagnosed in December of last year with amyloidosis.  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  He is followed at [facility].  He is treated with cyclophosphamide, Velcade, dexamethasone.  He was coming home from a cardiology appointment.  He has been told that he does have significant amyloid involvement in his heart.  He previously has had very large according to his wife 5 L pleural effusions that have required thoracentesis and was told this was due to amyloid involvement of his heart.  She says his ejection fraction is 50% presumably with reduced left ventricular chamber size and significant systolic dysfunction.  CT scan of the chest showed small to moderate clot burden in the left lung.  He did have documented runs of ventricular tachycardia on arrival and required intubation due to hypoxemia and arrhythmias.  He currently is on amiodarone receiving fentanyl with Precedex being titrated off intermittent Versed and heparin drip is about to be started.  I discussed with his wife at length the possibility of using TPA due to the fact that he is on Levophed at 8 mics for hypotension.  I believe his hypotension is in part due to his systolic dysfunction in combination with his PE and with his systemic amyloidosis I do not know with the increased risk of significant or active complications might be.  Because of this we decided to withhold TPA and use high-dose heparin.  His blood pressure currently is about 100/60 on the 8  mics of Levophed.  He also is on amiodarone drip. His wife does tell me that he has had significant issues with worsening stamina over the last month or 2 decreased ability to get around presumably due to progressive amyloid.  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 Clot burden is small to moderate. Positive for acute PE with CT evidence of right heart strain (RV/LV Ratio = 1.1) consistent with at least submassive (intermediate risk) PE. The presence of right heart strain has been associated with an increased risk of morbidity and mortality. Please activate Code PE by paging [phone]. 2. Left ventricular hypertrophy and mild cardiomegaly. 3. Large bilateral pleural effusions with passive atelectasis. Only a minority of the lung is aerated. This may reduce overall respiratory reserve. 4. Third spacing of fluid with edema in mediastinal and subcutaneous tissues. Secondary pulmonary lobular interstitial accentuation in the lungs compatible with interstitial pulmonary edema. 5. Age-indeterminate mild superior endplate compression at T10.    Micro Data:  na  Antimicrobials:  na  Interim history/subjective:  Awake alert tolerating spontaneous breathing trial Objective   Blood pressure 98/72, pulse 80, temperature 98.2 F (36.8 C), temperature source Oral, resp. rate Marland Kitchen)  6, height 6\' 2"  (1.88 m), weight 66.2 kg, SpO2 95 %. CVP:  [10 mmHg] 10 mmHg  Vent Mode: PSV;CPAP FiO2 (%):  [40 %-50 %] 40 % Set Rate:  [14 bmp] 14 bmp Vt Set:  [470 mL] 470 mL PEEP:  [5 cmH20] 5 cmH20 Pressure Support:  [5  cmH20] 5 cmH20 Plateau Pressure:  [18 cmH20-20 cmH20] 18 cmH20   Intake/Output Summary (Last 24 hours) at 04/25/2019 0854 Last data filed at 04/25/2019 0800 Gross per 24 hour  Intake 1780.7 ml  Output 1125 ml  Net 655.7 ml   Filed Weights   04/23/19 2250 04/24/19 0351 04/25/19 0431  Weight: 70.9 kg 70.9 kg 66.2 kg   Co ox 52 Examination: General: Frail cachectic male who is awake and alert HEENT: Endotracheal tube in place Neuro: Awake alert neurologically intact CV: Heart sounds are currently regular PULM: even/non-labored, lungs bilaterally diminished breath sounds in the bases MO:QHUT, non-tender, bsx4 active  Extremities: warm/dry, negative edema  Skin: Multiple areas of ecchymosis   Resolved Hospital Problem list   na  Assessment & Plan:  Pulmonary embolus: Right heart strain per CT   EF 50% per last Echo  BNP of  2500 on admission  Unknown risk of potential bleeding due to his systemic amyloidosis   Plan  Full dose heparin with no TPA Continue mechanical ventilation as necessary Decreased LV function noted on 2D echo Cardiology is following   V tach on admission Amyloidosis with cards involvement Plan Cardiac monitoring Mag greater than 2 Potassium greater than 4 Etiology is following  Acute Respiratory Failure CXR with Pulmonary edema Effusions per CXR Plan Wean FiO2 Continues breathing trial Leaning towards extubation Consider thoracentesis of right side if able to tolerate Consider extubation and then follow-up thoracentesis  Hypotension 2/2 systolic dysfunction in combination with his PE and with his systemic amyloidosis  Plan Continue Levophed and wean as tolerated for mean arterial pressure greater than 65   Amyloidosis:  Cardiac and renal involvement Followed at [facility] Rapidly progressive despite aggressive therapy Currently will withhold cyclophosphamide.Takes dexamethasone every Friday  Plan:  Cortisol level 24.7 Currently on  Levophed  Wean pressors as tolerated  Renal Amyloid Chronic Kidney Injury Hypokalemia Plan Strict INO Monitor replete electrolytes as needed   GI Tube feeding started Pepcid  Best practice:  Diet: npo/ TF Pain/Anxiety/Delirium protocol (if indicated):  Fentanyl prn VAP protocol (if indicated): yes DVT prophylaxis: heparin GI prophylaxis: pepcid Glucose control: monitor CBG Mobility: bedrest Code Status: full Family Communication:  Disposition: icu  Labs   CBC: Recent Labs  Lab 04/23/19 1535 04/23/19 1604 04/23/19 2048 04/24/19 0302 04/25/19 0403  WBC 5.8  --   --  7.0 5.6  NEUTROABS 4.5  --   --   --   --   HGB 13.8 11.2* 11.6* 12.9* 13.6  HCT 39.3 33.0* 34.0* 37.2* 39.2  MCV 110.7*  --   --  109.4* 111.4*  PLT 250  --   --  274 654    Basic Metabolic Panel: Recent Labs  Lab 04/23/19 1535 04/23/19 1604 04/23/19 1633 04/23/19 2048 04/24/19 0302 04/24/19 0924 04/25/19 0403  NA 136 134*  --  132* 137  --  137  K 3.2* 2.6*  --  3.4* 3.7  --  4.2  CL 89*  --   --   --  94*  --  95*  CO2 33*  --   --   --  32  --  31  GLUCOSE 126*  --   --   --  135*  --  126*  BUN 46*  --   --   --  37*  --  38*  CREATININE 1.28*  --   --   --  1.13  --  1.50*  CALCIUM 8.8*  --   --   --  8.4*  --  8.8*  MG  --   --  2.1  --   --  1.9 2.0   GFR: Estimated Creatinine Clearance: 49 mL/min (A) (by C-G formula based on SCr of 1.5 mg/dL (H)). Recent Labs  Lab 04/23/19 1535 04/23/19 1537 04/23/19 1837 04/24/19 0302 04/24/19 0923 04/25/19 0403  WBC 5.8  --   --  7.0  --  5.6  LATICACIDVEN  --  2.1* 4.3*  --  1.1  --     Liver Function Tests: Recent Labs  Lab 04/23/19 1535 04/24/19 0302 04/25/19 0403  AST 44* 31 22  ALT 53* 48* 37  ALKPHOS 114 110 98  BILITOT 0.7 0.6 0.6  PROT 5.9* 5.3* 5.5*  ALBUMIN 2.3* 2.0* 1.9*   No results for input(s): LIPASE, AMYLASE in the last 168 hours. No results for input(s): AMMONIA in the last 168 hours.  ABG      Component Value Date/Time   PHART 7.672 (HH) 04/23/2019 2048   PCO2ART 29.3 (L) 04/23/2019 2048   PO2ART 118.0 (H) 04/23/2019 2048   HCO3 34.0 (H) 04/23/2019 2048   TCO2 35 (H) 04/23/2019 2048   O2SAT 99.0 04/23/2019 2048     Coagulation Profile: Recent Labs  Lab 04/23/19 1535  INR 1.0    Cardiac Enzymes: Recent Labs  Lab 04/23/19 1535  TROPONINI 0.25*    HbA1C: No results found for: HGBA1C  CBG: Recent Labs  Lab 04/24/19 1514 04/24/19 1920 04/24/19 2324 04/25/19 0348 04/25/19 0716  GLUCAP 115* 114* 123* 125* 100*     Critical care time: 35 minutes spent in evaluation and critical care planning    [facility] Romelo Sciandra ACNP Maryanna Shape PCCM Pager [phone] till 1 pm If no answer page [phone] 04/25/2019, 8:54 AM

## 2019-04-25 NOTE — Progress Notes (Signed)
ANTICOAGULATION CONSULT NOTE   Pharmacy Consult for Heparin Indication: pulmonary embolus  No Known Allergies  Patient Measurements: Height: 6\' 2"  (188 cm) Weight: 145 lb 15.1 oz (66.2 kg) IBW/kg (Calculated) : 82.2  Heparin dosing weight: 70.6kg  Vital Signs: Temp: 98.5 F (36.9 C) (06/10 0350) Temp Source: Oral (06/10 0350) BP: 87/67 (06/10 0600) Pulse Rate: 78 (06/10 0600)  Labs: Recent Labs    04/23/19 1535  04/23/19 2048 04/24/19 0302 04/24/19 1600 04/25/19 0403  HGB 13.8   < > 11.6* 12.9*  --  13.6  HCT 39.3   < > 34.0* 37.2*  --  39.2  PLT 250  --   --  274  --  271  LABPROT 13.4  --   --   --   --   --   INR 1.0  --   --   --   --   --   HEPARINUNFRC  --   --   --  0.50 0.36 0.41  CREATININE 1.28*  --   --  1.13  --  1.50*  TROPONINI 0.25*  --   --   --   --   --    < > = values in this interval not displayed.    Estimated Creatinine Clearance: 49 mL/min (A) (by C-G formula based on SCr of 1.5 mg/dL (H)).   Medical History: History reviewed. No pertinent past medical history.  Assessment: Erik Costa is a 58yo male admitted with pulmonary embolism. Pharmacy consulted to start heparin infusion. Chest CT: Acute pulmonary embolus involving RUL and RLL. Clot burden is small to moderate. Positive for acute PE with CT evidence of right heart strain. RV/LV Ratio = 1.1. Reviewed outpatient notes from [facility] and patient not on prior anticoagulation.  Heparin level therapeutic this morning at 0.41. CBC stable/improving. No bleeding reported.  Goal of Therapy:  Heparin level 0.3-0.7 units/ml Monitor platelets by anticoagulation protocol: Yes   Plan:  Cont heparin at 1150 units/hr Monitor daily heparin level, CBC, s/sx of bleeding  Vertis Kelch, PharmD PGY1 Pharmacy Resident Phone [phone] 04/25/2019       6:38 AM

## 2019-04-25 NOTE — Progress Notes (Signed)
Lower extremity venous has been completed.   Preliminary results in CV Proc.   Abram Sander 04/25/2019 10:49 AM

## 2019-04-25 NOTE — Procedures (Signed)
Extubation Procedure Note  Patient Details:   Name: Erik Costa DOB: 1961-03-10 MRN: [id number]   Airway Documentation:    Vent end date: 04/25/19 Vent end time: 1020   Evaluation  O2 sats: stable throughout Complications: No apparent complications Patient did tolerate procedure well. Bilateral Breath Sounds: Clear   Yes   Pt extubated per order without difficulty.  Placed on 2 l/m Trion  Earney Navy 04/25/2019, 10:22 AM

## 2019-04-25 NOTE — Progress Notes (Signed)
Assisted tele visit to patient with wife, daughter and family member.  Maryelizabeth Rowan, RN

## 2019-04-25 NOTE — Progress Notes (Signed)
Initial Nutrition Assessment RD working remotely.  DOCUMENTATION CODES:   Not applicable  INTERVENTION:    Vital AF 1.2 at 55 ml/h (1320 ml per day)   Pro-stat 30 ml once daily   Provides 1684 kcal, 114 gm protein, 1071 ml free water daily  NUTRITION DIAGNOSIS:   Inadequate oral intake related to inability to eat as evidenced by NPO status.  GOAL:   Patient will meet greater than or equal to 90% of their needs  MONITOR:   Vent status, TF tolerance, Labs, Skin, Weight trends, I & O's  REASON FOR ASSESSMENT:   Ventilator, Consult Enteral/tube feeding initiation and management  ASSESSMENT:   58 yo male admitted after sudden syncopal episode, found to have pulmonary embolus. PMH of amyloidosis.   Received MD Consult for TF initiation and management. OGT in place. Potential extubation today.  TF was initiated yesterday, receiving Vital AF 1.2 at 20 ml/h via OGT.  Patient is currently intubated on ventilator support MV: 6.6 L/min Temp (24hrs), Avg:98.4 F (36.9 C), Min:98.2 F (36.8 C), Max:98.5 F (36.9 C)   Labs reviewed. BUN 38 (H), creatinine 1.5 (H) Medications reviewed and include Lasix, Novolog, Levophed.   Suspect actual weight is lower than recorded weights due to volume overload, third spacing. Per RN documentation, patient has mild pitting edema to BLE.  NUTRITION - FOCUSED PHYSICAL EXAM:  deferred  Diet Order:   Diet Order            Diet NPO time specified  Diet effective now              EDUCATION NEEDS:   No education needs have been identified at this time  Skin:  Skin Assessment: Reviewed RN Assessment(skin tear to left upper chest)  Last BM:  no BM documented  Height:   Ht Readings from Last 1 Encounters:  04/24/19 6\' 2"  (1.88 m)    Weight:   Wt Readings from Last 1 Encounters:  04/25/19 66.2 kg    Ideal Body Weight:  86.4 kg  BMI:  Body mass index is 18.74 kg/m.  Estimated Nutritional Needs:   Kcal:   1655  Protein:  100-125 gm  Fluid:  1.8 L    Molli Barrows, RD, LDN, [place] Pager [phone] After Hours Pager [phone]

## 2019-04-26 ENCOUNTER — Inpatient Hospital Stay (HOSPITAL_COMMUNITY): Payer: Managed Care, Other (non HMO)

## 2019-04-26 DIAGNOSIS — E43 Unspecified severe protein-calorie malnutrition: Secondary | ICD-10-CM | POA: Insufficient documentation

## 2019-04-26 DIAGNOSIS — J90 Pleural effusion, not elsewhere classified: Secondary | ICD-10-CM

## 2019-04-26 LAB — COMPREHENSIVE METABOLIC PANEL
ALT: 34 U/L (ref 0–44)
AST: 24 U/L (ref 15–41)
Albumin: 1.9 g/dL — ABNORMAL LOW (ref 3.5–5.0)
Alkaline Phosphatase: 92 U/L (ref 38–126)
Anion gap: 13 (ref 5–15)
BUN: 53 mg/dL — ABNORMAL HIGH (ref 6–20)
CO2: 29 mmol/L (ref 22–32)
Calcium: 8.7 mg/dL — ABNORMAL LOW (ref 8.9–10.3)
Chloride: 94 mmol/L — ABNORMAL LOW (ref 98–111)
Creatinine, Ser: 2.26 mg/dL — ABNORMAL HIGH (ref 0.61–1.24)
GFR calc Af Amer: 35 mL/min — ABNORMAL LOW (ref >60)
GFR calc non Af Amer: 30 mL/min — ABNORMAL LOW (ref >60)
Glucose, Bld: 99 mg/dL (ref 70–99)
Potassium: 4.2 mmol/L (ref 3.5–5.1)
Sodium: 136 mmol/L (ref 135–145)
Total Bilirubin: 0.9 mg/dL (ref 0.3–1.2)
Total Protein: 5.3 g/dL — ABNORMAL LOW (ref 6.5–8.1)

## 2019-04-26 LAB — BODY FLUID CELL COUNT WITH DIFFERENTIAL
Lymphs, Fluid: 45 %
Monocyte-Macrophage-Serous Fluid: 3 % — ABNORMAL LOW (ref 50–90)
Neutrophil Count, Fluid: 51 % — ABNORMAL HIGH (ref 0–25)
Total Nucleated Cell Count, Fluid: 275 cu mm (ref 0–1000)

## 2019-04-26 LAB — GLUCOSE, CAPILLARY
Glucose-Capillary: 93 mg/dL (ref 70–99)
Glucose-Capillary: 94 mg/dL (ref 70–99)
Glucose-Capillary: 148 mg/dL — ABNORMAL HIGH (ref 70–99)

## 2019-04-26 LAB — CBC WITH DIFFERENTIAL/PLATELET
Abs Immature Granulocytes: 0 10*3/uL (ref 0.00–0.07)
Band Neutrophils: 1 %
Basophils Absolute: 0 10*3/uL (ref 0.0–0.1)
Basophils Relative: 0 %
Eosinophils Absolute: 0 10*3/uL (ref 0.0–0.5)
Eosinophils Relative: 0 %
HCT: 34.9 % — ABNORMAL LOW (ref 39.0–52.0)
Hemoglobin: 12.1 g/dL — ABNORMAL LOW (ref 13.0–17.0)
Lymphocytes Relative: 6 %
Lymphs Abs: 0.2 10*3/uL — ABNORMAL LOW (ref 0.7–4.0)
MCH: 38.5 pg — ABNORMAL HIGH (ref 26.0–34.0)
MCHC: 34.7 g/dL (ref 30.0–36.0)
MCV: 111.1 fL — ABNORMAL HIGH (ref 80.0–100.0)
Monocytes Absolute: 0.2 10*3/uL (ref 0.1–1.0)
Monocytes Relative: 5 %
Neutro Abs: 3.3 10*3/uL (ref 1.7–7.7)
Neutrophils Relative %: 88 %
Platelets: 189 10*3/uL (ref 150–400)
RBC: 3.14 MIL/uL — ABNORMAL LOW (ref 4.22–5.81)
RDW: 15.5 % (ref 11.5–15.5)
WBC: 3.7 10*3/uL — ABNORMAL LOW (ref 4.0–10.5)
nRBC: 0 % (ref 0.0–0.2)
nRBC: 0 /100 WBC

## 2019-04-26 LAB — COOXEMETRY PANEL
Carboxyhemoglobin: 1.7 % — ABNORMAL HIGH (ref 0.5–1.5)
Methemoglobin: 0.7 % (ref 0.0–1.5)
O2 Saturation: 58.4 %
Total hemoglobin: 13.1 g/dL (ref 12.0–16.0)

## 2019-04-26 LAB — PROTEIN, TOTAL: Total Protein: 5.3 g/dL — ABNORMAL LOW (ref 6.5–8.1)

## 2019-04-26 LAB — HEPARIN LEVEL (UNFRACTIONATED)
Heparin Unfractionated: 0.26 IU/mL — ABNORMAL LOW (ref 0.30–0.70)
Heparin Unfractionated: <0.1 IU/mL — ABNORMAL LOW (ref 0.30–0.70)

## 2019-04-26 LAB — MAGNESIUM: Magnesium: 2.1 mg/dL (ref 1.7–2.4)

## 2019-04-26 LAB — LACTATE DEHYDROGENASE: LDH: 91 U/L — ABNORMAL LOW (ref 98–192)

## 2019-04-26 LAB — CHOLESTEROL, TOTAL: Cholesterol: 206 mg/dL — ABNORMAL HIGH (ref 0–200)

## 2019-04-26 LAB — PHOSPHORUS: Phosphorus: 5.4 mg/dL — ABNORMAL HIGH (ref 2.5–4.6)

## 2019-04-26 MED ORDER — MIDODRINE HCL 5 MG PO TABS
5.0000 mg | ORAL_TABLET | Freq: Three times a day (TID) | ORAL | Status: DC
  Administered 2019-04-26 – 2019-05-02 (×18): 5 mg via ORAL
  Filled 2019-04-26 (×17): qty 1

## 2019-04-26 MED ORDER — ENSURE ENLIVE PO LIQD
237.0000 mL | Freq: Two times a day (BID) | ORAL | Status: DC
  Administered 2019-04-26 – 2019-04-27 (×2): 237 mL via ORAL

## 2019-04-26 MED ORDER — HEPARIN (PORCINE) 25000 UT/250ML-% IV SOLN
1300.0000 [IU]/h | INTRAVENOUS | Status: DC
  Administered 2019-04-26: 1200 [IU]/h via INTRAVENOUS

## 2019-04-26 NOTE — Progress Notes (Addendum)
NAME:  Erik Costa, MRN:  [id number], DOB:  04-08-1961, LOS: 3 ADMISSION DATE:  04/23/2019, CONSULTATION DATE: 04/23/2019 REFERRING MD:  ER, CHIEF COMPLAINT: Sudden syncope  Brief History   Patient is a 58 year old with systemic amyloidosis treated at [facility] to the emergency room today with sudden collapse found to have pulmonary embolus on CT scan of the chest.  History of present illness   She is a 58 year old diagnosed in December of last year with amyloidosis.  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  He is followed at [facility].  He is treated with cyclophosphamide, Velcade, dexamethasone.  He was coming home from a cardiology appointment.  He has been told that he does have significant amyloid involvement in his heart.  He previously has had very large according to his wife 5 L pleural effusions that have required thoracentesis and was told this was due to amyloid involvement of his heart.  She says his ejection fraction is 50% presumably with reduced left ventricular chamber size and significant systolic dysfunction.  CT scan of the chest showed small to moderate clot burden in the left lung.  He did have documented runs of ventricular tachycardia on arrival and required intubation due to hypoxemia and arrhythmias.  He currently is on amiodarone receiving fentanyl with Precedex being titrated off intermittent Versed and heparin drip is about to be started.  I discussed with his wife at length the possibility of using TPA due to the fact that he is on Levophed at 8 mics for hypotension.  I believe his hypotension is in part due to his systolic dysfunction in combination with his PE and with his systemic amyloidosis I do not know with the increased risk of significant or active complications might be.  Because of this we decided to withhold TPA and use high-dose heparin.  His blood pressure currently is about 100/60 on the 8  mics of Levophed.  He also is on amiodarone drip. His wife does tell me that he has had significant issues with worsening stamina over the last month or 2 decreased ability to get around presumably due to progressive amyloid.  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 Clot burden is small to moderate. Positive for acute PE with CT evidence of right heart strain (RV/LV Ratio = 1.1) consistent with at least submassive (intermediate risk) PE. The presence of right heart strain has been associated with an increased risk of morbidity and mortality. Please activate Code PE by paging [phone]. 2. Left ventricular hypertrophy and mild cardiomegaly. 3. Large bilateral pleural effusions with passive atelectasis. Only a minority of the lung is aerated. This may reduce overall respiratory reserve. 4. Third spacing of fluid with edema in mediastinal and subcutaneous tissues. Secondary pulmonary lobular interstitial accentuation in the lungs compatible with interstitial pulmonary edema. 5. Age-indeterminate mild superior endplate compression at T10.    Micro Data:  na  Antimicrobials:  na  Interim history/subjective:  Currently awake and alert.  Blood pressure is on the soft side but midodrine  been initiated. Objective   Blood pressure (!) 85/61,  pulse 81, temperature 97.6 F (36.4 C), temperature source Axillary, resp. rate 14, height 6\' 2"  (1.88 m), weight 66.2 kg, SpO2 98 %.    FiO2 (%):  [40 %] 40 %   Intake/Output Summary (Last 24 hours) at 04/26/2019 0853 Last data filed at 04/26/2019 0700 Gross  per 24 hour  Intake 814.04 ml  Output 500 ml  Net 314.04 ml   Filed Weights   04/23/19 2250 04/24/19 0351 04/25/19 0431  Weight: 70.9 kg 70.9 kg 66.2 kg    Examination: General: Frail thin male no acute distress. HEENT: No JVD or lymphadenopathy is appreciated Neuro: Awake alert follows commands CV: Heart sounds are regular PULM: Creased breath sounds in the bases GU:YQIH, non-tender, bsx4 active  Extremities: warm/dry, 1+ edema  Skin: no rashes or lesions   Right posterior chest pleural effusion   Resolved Hospital Problem list   na  Assessment & Plan:  Pulmonary embolus: Right heart strain per CT   EF 50% per last Echo  BNP of  2500 on admission  Unknown risk of potential bleeding due to his systemic amyloidosis  DVT  Plan  Continue full dose heparin Been successfully liberated from mechanical ventilatory support Cardiology is following   V tach on admission Amyloidosis with cards involvement Plan Cardiac monitoring Continue magnesium greater than 2 Pat potassium greater than 4 Appreciate cardiology's involvement  Acute Respiratory Failure CXR with Pulmonary edema Effusions per CXR Plan Currently extubated low-flow O2 Continuous right greater than left pleural effusion Consideration for thoracentesis initially on the right and then on the left Will discuss with physician concerning interventional radiology versus bedside  Hypotension 2/2 systolic dysfunction in combination with his PE and with his systemic amyloidosis  Plan Currently off vasopressor support CVP is 10 Midodrine initiated   Amyloidosis:  Cardiac and renal involvement Followed at [facility] Rapidly progressive despite aggressive therapy Currently will withhold cyclophosphamide.Takes dexamethasone every Friday  Plan:  Currently off vasopressor support    Renal Amyloid Chronic Kidney Injury Hypokalemia Lab Results  Component Value Date   CREATININE 2.26 (H) 04/26/2019    CREATININE 1.50 (H) 04/25/2019   CREATININE 1.13 04/24/2019   Recent Labs  Lab 04/24/19 0302 04/25/19 0403 04/26/19 0522  K 3.7 4.2 4.2    Plan Strict I/O Monitor replete electrolytes as needed Monitor creatinine note climbing from 1.13-2.26 on 04/26/2019 Hold diuresis   GI Taking heart healthy diet 7  Best practice:  Diet: npo/ TF Pain/Anxiety/Delirium protocol (if indicated):  Fentanyl prn VAP protocol (if indicated): yes DVT prophylaxis: heparin GI prophylaxis: pepcid Glucose control: monitor CBG Mobility: bedrest Code Status: full Family Communication: 04/26/2019 patient updated at bedside Disposition: icu, 04/26/2019 transition to progressive care unit and to [facility] service.  Labs   CBC: Recent Labs  Lab 04/23/19 1535 04/23/19 1604 04/23/19 2048 04/24/19 0302 04/25/19 0403 04/26/19 0522  WBC 5.8  --   --  7.0 5.6 3.7*  NEUTROABS 4.5  --   --   --   --  3.3  HGB 13.8 11.2* 11.6* 12.9* 13.6 12.1*  HCT 39.3 33.0* 34.0* 37.2* 39.2 34.9*  MCV 110.7*  --   --  109.4* 111.4* 111.1*  PLT 250  --   --  274 271 474    Basic Metabolic Panel: Recent Labs  Lab 04/23/19 1535 04/23/19 1604 04/23/19 1633 04/23/19 2048 04/24/19 0302 04/24/19 0924 04/25/19 0403 04/26/19 0522  NA 136 134*  --  132* 137  --  137 136  K 3.2*  2.6*  --  3.4* 3.7  --  4.2 4.2  CL 89*  --   --   --  94*  --  95* 94*  CO2 33*  --   --   --  32  --  31 29  GLUCOSE 126*  --   --   --  135*  --  126* 99  BUN 46*  --   --   --  37*  --  38* 53*  CREATININE 1.28*  --   --   --  1.13  --  1.50* 2.26*  CALCIUM 8.8*  --   --   --  8.4*  --  8.8* 8.7*  MG  --   --  2.1  --   --  1.9 2.0 2.1  PHOS  --   --   --   --   --   --   --  5.4*   GFR: Estimated Creatinine Clearance: 32.5 mL/min (A) (by C-G formula based on SCr of 2.26 mg/dL (H)). Recent Labs  Lab 04/23/19 1535 04/23/19 1537 04/23/19 1837 04/24/19 0302 04/24/19 0923 04/25/19 0403 04/26/19 0522  WBC 5.8  --   --  7.0  --  5.6  3.7*  LATICACIDVEN  --  2.1* 4.3*  --  1.1  --   --     Liver Function Tests: Recent Labs  Lab 04/23/19 1535 04/24/19 0302 04/25/19 0403 04/26/19 0522  AST 44* 31 22 24   ALT 53* 48* 37 34  ALKPHOS 114 110 98 92  BILITOT 0.7 0.6 0.6 0.9  PROT 5.9* 5.3* 5.5* 5.3*  ALBUMIN 2.3* 2.0* 1.9* 1.9*   No results for input(s): LIPASE, AMYLASE in the last 168 hours. No results for input(s): AMMONIA in the last 168 hours.  ABG    Component Value Date/Time   PHART 7.672 (HH) 04/23/2019 2048   PCO2ART 29.3 (L) 04/23/2019 2048   PO2ART 118.0 (H) 04/23/2019 2048   HCO3 34.0 (H) 04/23/2019 2048   TCO2 35 (H) 04/23/2019 2048   O2SAT 58.4 04/26/2019 0515     Coagulation Profile: Recent Labs  Lab 04/23/19 1535  INR 1.0    Cardiac Enzymes: Recent Labs  Lab 04/23/19 1535  TROPONINI 0.25*    HbA1C: No results found for: HGBA1C  CBG: Recent Labs  Lab 04/25/19 1521 04/25/19 1935 04/25/19 2330 04/26/19 0347 04/26/19 0735  GLUCAP 110* 107* 136* 93 94     Critical care time: 35 minutes spent in evaluation and critical care planning    [facility] [place] Ariannah Arenson ACNP Maryanna Shape PCCM Pager [phone] till 1 pm If no answer page [phone] 04/26/2019, 8:53 AM

## 2019-04-26 NOTE — Progress Notes (Signed)
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  Ongoing   GOAL:   Patient will meet greater than or equal to 90% of their needs  Progressing   MONITOR:   PO intake, Supplement acceptance, Labs, Skin, I & O's  REASON FOR ASSESSMENT:   Ventilator, Consult Enteral/tube feeding initiation and management  ASSESSMENT:   58 yo male admitted after sudden syncopal episode, found to have pulmonary embolus. PMH of amyloidosis.   Patient was extubated yesterday.  Spoke with patient in his room today. He reports usual weight 150 lbs when he left the hospital 16 weeks ago. Now down to 145 lbs. At home he eats a very healthy diet. He has cereal with fruit and a Pure Protein drink for breakfast; vegetable omelet for lunch; grilled fish and vegetables for supper. He also has 2-3 snacks between meals, mixed nuts or ice cream with strawberries on top.    Discussed with patient the need for increased calorie and protein intake for muscle repletion. He agreed to drink Ensure Enlive between meals and receive Magic cups TID with meals.   Labs reviewed. BUN 53 (H), creatinine 2.26 (H) CBG's: 93-94 Medications reviewed. Levophed currently off.  NUTRITION - FOCUSED PHYSICAL EXAM:    Most Recent Value  Orbital Region  Severe depletion  Upper Arm Region  Mild depletion  Thoracic and Lumbar Region  Severe depletion  Buccal Region  Severe depletion  Temple Region  Severe depletion  Clavicle Bone Region  Severe depletion  Clavicle and Acromion Bone  Region  Severe depletion  Scapular Bone Region  Severe depletion  Dorsal Hand  No depletion  Patellar Region  No depletion  Anterior Thigh Region  No depletion  Posterior Calf Region  Mild depletion  Edema (RD Assessment)  Mild  Hair  Reviewed  Eyes  Reviewed  Mouth  Reviewed  Skin  Reviewed  Nails  Reviewed       Diet Order:   Diet Order            Diet 2 gram sodium Room service appropriate? Yes; Fluid consistency: Thin  Diet effective now              EDUCATION NEEDS:   Education needs have been addressed  Skin:  Skin Assessment: Reviewed RN Assessment(skin tear to left upper chest)  Last BM:  6/11 (type 2)  Height:   Ht Readings from Last 1 Encounters:  04/24/19 6\' 2"  (1.88 m)    Weight:   Wt Readings from Last 1 Encounters:  04/25/19 66.2 kg    Ideal Body Weight:  86.4 kg  BMI:  Body mass index is 18.74 kg/m.  Estimated Nutritional Needs:   Kcal:  1655  Protein:  100-125 gm  Fluid:  1.8 L    Molli Barrows, RD, LDN, [place] Pager [phone] After Hours Pager [phone]

## 2019-04-26 NOTE — Progress Notes (Signed)
Assisted tele visit to patient with wife.  Erik Costa, Janace Hoard, RN

## 2019-04-26 NOTE — Procedures (Addendum)
Thoracentesis Procedure Note  Pre-operative Diagnosis: Pleural effsion  Post-operative Diagnosis: same  Indications: dyspnea  Procedure Details  Consent: Informed consent was obtained. Risks of the procedure were discussed including: infection, bleeding, pain, pneumothorax.  Under sterile conditions the patient was positioned. Betadine solution and sterile drapes were utilized.  1% buffered lidocaine was used to anesthetize the 6th rib space. Fluid was obtained without any difficulties and minimal blood loss.  A dressing was a  pplied to the wound and wound care instructions were provided.   Findings 1500 ml of cloudy pleural fluid was obtained. A sample was sent to Pathology f and cell counts, as well as for infection analysis.  Complications:  None; patient tolerated the procedure well.          Condition: stable  Plan A follow up chest x-ray was ordered. Bed Rest for 1 hours. Tylenol 650 mg. for pain.  Richardson Landry Lorilynn Lehr ACNP Maryanna Shape PCCM Pager [phone] till 1 pm If no answer page [phone] 04/26/2019, 12:15 PM

## 2019-04-26 NOTE — Progress Notes (Signed)
ANTICOAGULATION CONSULT NOTE - Follow Up Consult  Pharmacy Consult for heparin Indication: pulmonary embolus  Labs: Recent Labs    04/24/19 0302  04/25/19 0403 04/26/19 0522 04/26/19 2153  HGB 12.9*  --  13.6 12.1*  --   HCT 37.2*  --  39.2 34.9*  --   PLT 274  --  271 189  --   HEPARINUNFRC 0.50   < > 0.41 0.26* <0.10*  CREATININE 1.13  --  1.50* 2.26*  --    < > = values in this interval not displayed.    Assessment: 58yo male subtherapeutic on heparin after resumed post thoracentesis; no gtt issues or signs of bleeding per RN.  Goal of Therapy:  Heparin level 0.3-0.7 units/ml   Plan:  Will increase heparin gtt by 1-2 units/kg/hr to 1300 units/hr and check level in 8 hours.    Wynona Neat, PharmD, BCPS  04/26/2019,11:42 PM

## 2019-04-26 NOTE — Progress Notes (Signed)
Assisted tele visit to patient with family member.  Babbette Dalesandro R, RN  

## 2019-04-26 NOTE — Progress Notes (Signed)
Erik Costa for Heparin Indication: pulmonary embolus  No Known Allergies  Patient Measurements: Height: 6\' 2"  (188 cm) Weight: 145 lb 15.1 oz (66.2 kg) IBW/kg (Calculated) : 82.2  Heparin dosing weight: 70.6kg  Vital Signs: Temp: 97.6 F (36.4 C) (06/11 0700) Temp Source: Axillary (06/11 0700) BP: 82/55 (06/11 1100) Pulse Rate: 87 (06/11 1100)  Labs: Recent Labs    04/23/19 1535  04/24/19 0302 04/24/19 1600 04/25/19 0403 04/26/19 0522  HGB 13.8   < > 12.9*  --  13.6 12.1*  HCT 39.3   < > 37.2*  --  39.2 34.9*  PLT 250  --  274  --  271 189  LABPROT 13.4  --   --   --   --   --   INR 1.0  --   --   --   --   --   HEPARINUNFRC  --    < > 0.50 0.36 0.41 0.26*  CREATININE 1.28*  --  1.13  --  1.50* 2.26*  TROPONINI 0.25*  --   --   --   --   --    < > = values in this interval not displayed.    Estimated Creatinine Clearance: 32.5 mL/min (A) (by C-G formula based on SCr of 2.26 mg/dL (H)).   Medical History: History reviewed. No pertinent past medical history.  Assessment: Erik Costa is a 58yo male admitted with pulmonary embolism. Pharmacy consulted to start heparin infusion. Chest CT: Acute pulmonary embolus involving RUL and RLL. Clot burden is small to moderate. Positive for acute PE with CT evidence of right heart strain. RV/LV Ratio = 1.1. Reviewed outpatient notes from [facility] and patient not on prior anticoagulation.  Rate increased this morning for subtherapeutic heparin level - gtt stopped prior to next level d/t thoracentesis (removed 1.3 L). CBC stable today. No bleeding reported.  Goal of Therapy:  Heparin level 0.3-0.7 units/ml Monitor platelets by anticoagulation protocol: Yes   Plan:  Restart heparin infusion 4 hours after thoracentesis Will resume heparin infusion at 1200 units/hr Monitor daily heparin level, CBC, s/sx of bleeding  Antonietta Jewel, PharmD, [place] Clinical Pharmacist  Pager:  [phone] Phone: [phone] 04/26/2019       12:44 PM

## 2019-04-26 NOTE — Progress Notes (Signed)
Advanced Heart Failure Rounding Note   Subjective:    Remains extubated. Off NE.SBP 80-90s  Denies SOB.   CVP 8-9. Co-ox 58% Cr 1.13 -> 1.5 -> 2.2  Rhythm stable   Objective:   Weight Range:  Vital Signs:   Temp:  [97.5 F (36.4 C)-98.2 F (36.8 C)] 97.6 F (36.4 C) (06/11 0700) Pulse Rate:  [77-96] 84 (06/11 0800) Resp:  [11-24] 20 (06/11 0800) BP: (73-99)/(49-78) 84/61 (06/11 0800) SpO2:  [88 %-100 %] 97 % (06/11 0800) FiO2 (%):  [40 %] 40 % (06/10 1132) Last BM Date: 04/26/19  Weight change: Filed Weights   04/23/19 2250 04/24/19 0351 04/25/19 0431  Weight: 70.9 kg 70.9 kg 66.2 kg    Intake/Output:   Intake/Output Summary (Last 24 hours) at 04/26/2019 1034 Last data filed at 04/26/2019 0800 Gross per 24 hour  Intake 738.22 ml  Output 455 ml  Net 283.22 ml     Physical Exam: General:  Sitting up in bed. No resp difficulty HEENT: normal mutiple ecchymosis Neck: supple. JVP to jaw  Carotids 2+ bilat; no bruits. No lymphadenopathy or thryomegaly appreciated. Cor: PMI nondisplaced. Regular rate & rhythm. 2/6 TR Lungs: clear dull 1/2 up bilaterally  Abdomen: soft, nontender, nondistended. No hepatosplenomegaly. No bruits or masses. Good bowel sounds. Extremities: no cyanosis, clubbing, rash, trace-1+  edema Neuro: alert & orientedx3, cranial nerves grossly intact. moves all 4 extremities w/o difficulty. Affect pleasant   Telemetry: Sinus 80s with 1AVB Personally reviewed   Labs: Basic Metabolic Panel: Recent Labs  Lab 04/23/19 1535 04/23/19 1604 04/23/19 1633 04/23/19 2048 04/24/19 0302 04/24/19 0924 04/25/19 0403 04/26/19 0522  NA 136 134*  --  132* 137  --  137 136  K 3.2* 2.6*  --  3.4* 3.7  --  4.2 4.2  CL 89*  --   --   --  94*  --  95* 94*  CO2 33*  --   --   --  32  --  31 29  GLUCOSE 126*  --   --   --  135*  --  126* 99  BUN 46*  --   --   --  37*  --  38* 53*  CREATININE 1.28*  --   --   --  1.13  --  1.50* 2.26*  CALCIUM 8.8*  --    --   --  8.4*  --  8.8* 8.7*  MG  --   --  2.1  --   --  1.9 2.0 2.1  PHOS  --   --   --   --   --   --   --  5.4*    Liver Function Tests: Recent Labs  Lab 04/23/19 1535 04/24/19 0302 04/25/19 0403 04/26/19 0522  AST 44* 31 22 24   ALT 53* 48* 37 34  ALKPHOS 114 110 98 92  BILITOT 0.7 0.6 0.6 0.9  PROT 5.9* 5.3* 5.5* 5.3*  ALBUMIN 2.3* 2.0* 1.9* 1.9*   No results for input(s): LIPASE, AMYLASE in the last 168 hours. No results for input(s): AMMONIA in the last 168 hours.  CBC: Recent Labs  Lab 04/23/19 1535 04/23/19 1604 04/23/19 2048 04/24/19 0302 04/25/19 0403 04/26/19 0522  WBC 5.8  --   --  7.0 5.6 3.7*  NEUTROABS 4.5  --   --   --   --  3.3  HGB 13.8 11.2* 11.6* 12.9* 13.6 12.1*  HCT 39.3 33.0* 34.0* 37.2* 39.2  34.9*  MCV 110.7*  --   --  109.4* 111.4* 111.1*  PLT 250  --   --  274 271 189    Cardiac Enzymes: Recent Labs  Lab 04/23/19 1535  TROPONINI 0.25*    BNP: BNP (last 3 results) Recent Labs    04/23/19 1537 04/25/19 0404  BNP 2,502.3* 2,888.1*    ProBNP (last 3 results) No results for input(s): PROBNP in the last 8760 hours.    Other results:  Imaging: Dg Chest Port 1 View  Result Date: 04/26/2019 CLINICAL DATA:  Respiratory failure.  Extubated. EXAM: PORTABLE CHEST 1 VIEW COMPARISON:  04/25/2019 FINDINGS: Endotracheal tube and orogastric tube have been removed. Left subclavian central line remains in place with tip at the SVC RA junction. Persistent bilateral effusions with bilateral lower lobe atelectasis and or pneumonia. Similar appearance to yesterday other than extubation. IMPRESSION: Endotracheal tube and orogastric tube removed. Persistent bilateral effusions with lower lobe atelectasis and or pneumonia. Electronically Signed   By: Nelson Chimes M.D.   On: 04/26/2019 08:15   Dg Chest Port 1 View  Result Date: 04/25/2019 CLINICAL DATA:  Respiratory failure EXAM: PORTABLE CHEST 1 VIEW COMPARISON:  04/24/2019 FINDINGS: Cardiac shadow  is stable. Endotracheal tube, gastric catheter and left subclavian central line are noted in satisfactory position. Bilateral pleural effusions are noted with underlying atelectatic changes. The overall appearance is stable from the prior exam. Vascular congestion remains as well. IMPRESSION: Changes of CHF with effusions and bibasilar atelectasis. Electronically Signed   By: Inez Catalina M.D.   On: 04/25/2019 08:06   Vas [place] Lower Extremity Venous (dvt)  Result Date: 04/25/2019  Lower Venous Study Indications: Pulmonary embolism.  Performing Technologist: Abram Sander RVS  Examination Guidelines: A complete evaluation includes B-mode imaging, spectral Doppler, color Doppler, and power Doppler as needed of all accessible portions of each vessel. Bilateral testing is considered an integral part of a complete examination. Limited examinations for reoccurring indications may be performed as noted.  +---------+---------------+---------+-----------+----------+-------+  RIGHT     Compressibility Phasicity Spontaneity Properties Summary  +---------+---------------+---------+-----------+----------+-------+  CFV       Full            No        Yes                             +---------+---------------+---------+-----------+----------+-------+  SFJ       Full                                                      +---------+---------------+---------+-----------+----------+-------+  FV Prox   Full                                                      +---------+---------------+---------+-----------+----------+-------+  FV Mid    Full                                                      +---------+---------------+---------+-----------+----------+-------+  FV  Distal Full                                                      +---------+---------------+---------+-----------+----------+-------+  PFV       Full                                                      +---------+---------------+---------+-----------+----------+-------+   POP       Full            Yes       Yes                             +---------+---------------+---------+-----------+----------+-------+  PTV       Full                                                      +---------+---------------+---------+-----------+----------+-------+  PERO      Full                                                      +---------+---------------+---------+-----------+----------+-------+ Area of mixed echos in the calf, etiology unknown.  +---------+---------------+---------+-----------+----------+-----------------+  LEFT      Compressibility Phasicity Spontaneity Properties Summary            +---------+---------------+---------+-----------+----------+-----------------+  CFV       Full            Yes       Yes                                       +---------+---------------+---------+-----------+----------+-----------------+  SFJ       Full                                                                +---------+---------------+---------+-----------+----------+-----------------+  FV Prox   Full                                                                +---------+---------------+---------+-----------+----------+-----------------+  FV Mid    Full                                                                +---------+---------------+---------+-----------+----------+-----------------+  FV Distal Full                                                                +---------+---------------+---------+-----------+----------+-----------------+  PFV       Full                                                                +---------+---------------+---------+-----------+----------+-----------------+  POP       Full            Yes       Yes                                       +---------+---------------+---------+-----------+----------+-----------------+  PTV       Full                                                                 +---------+---------------+---------+-----------+----------+-----------------+  PERO      None                                             Age Indeterminate  +---------+---------------+---------+-----------+----------+-----------------+     Summary: Right: There is no evidence of deep vein thrombosis in the lower extremity. No cystic structure found in the popliteal fossa. 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 No cystic structure found in the popliteal fossa.  *See table(s) above for measurements and observations. Electronically signed by Curt Jews MD on 04/25/2019 at 5:01:26 PM.    Final      Medications:     Scheduled Medications:  Chlorhexidine Gluconate Cloth  6 each Topical Q0600   feeding supplement (PRO-STAT SUGAR FREE 64)  30 mL Per Tube Daily   fentaNYL (SUBLIMAZE) injection  100 mcg Intravenous Once   insulin aspart  0-15 Units Subcutaneous Q4H   mouth rinse  15 mL Mouth Rinse BID   midodrine  5 mg Oral TID WC    Infusions:  sodium chloride Stopped (04/25/19 0936)   famotidine (PEPCID) IV 20 mg (04/26/19 1020)   feeding supplement (VITAL AF 1.2 CAL)     heparin Stopped (04/26/19 1020)   norepinephrine (LEVOPHED) Adult infusion Stopped (04/25/19 1100)    PRN Medications: fentaNYL (SUBLIMAZE) injection, fentaNYL (SUBLIMAZE) injection, midazolam   Assessment/Plan:   1. Acute hypoxic respiratory failure - likely multifactorial due to acute PE, HF and large bilateral effusions - CCM managing.  - CVP 9. Creatinine increasing. With RV strain need to be careful with diuresis as he is preload dependent Will stop lasix for now.  - Will likely need thora. D/w CCM   2. 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  - Will follow can start dobutamine as needed. Would keep MAP > 60 - Hold lasix with AKI   3.  Acute bilateral pulmonary embolism: - Continue heparin -> coumadin - Managed per PCCM - u/s LE: findings consistent with age indeterminate deep vein thrombosis involving the left peroneal vein  4.  Hypotension: -Off pressors currently. Start dobutamine 22.5 as needed to keep MAP > 60 - Will add midodrine 5 tid   5.  VT - occurred in setting of respiratory distress. Now quiescent on IV amio - Rhythm stable but has very marked AV block (not uncommon with amyloid) - Off amio.  - Follow on tele. Keep K> 4.0 Mg > 2.0  6. Pleural effusions - this has been chronic problem for him R>L - doubt we can diurese him enough to improve these - would consider thora. Risk of holding heparin for procedure not prohibitive and benefit of removing effusions likely will be key to avoid re-intubation,   7. 1st degree AV block > 366m - off amio. follow  8. Multiple myeloma with AL amyloidosis - therapy currently on hold - may need stress dose steroids  9. AKI - see above. Hold lasix. Keep MAPs > 60  CRITICAL CARE Performed by: BGlori Bickers Total critical care time: 35 minutes  Critical care time was exclusive of separately billable procedures and treating other patients.  Critical care was necessary to treat or prevent imminent or life-threatening deterioration.  Critical care was time spent personally by me (independent of midlevel providers or residents) on the following activities: development of treatment plan with patient and/or surrogate as well as nursing, discussions with consultants, evaluation of patient's response to treatment, examination of  patient, obtaining history from patient or surrogate, ordering and performing treatments and interventions, ordering and review of laboratory studies, ordering and review of radiographic studies, pulse oximetry and re-evaluation of patient's condition.     Length of Stay: 3   DGlori BickersMD 04/26/2019, 10:34 AM  Advanced Heart Failure Team Pager [phone](M-F; [address]  Please contact [email or web address] for night-coverage after hours (4p -7a ) and weekends on [email or web address]

## 2019-04-26 NOTE — Progress Notes (Signed)
Waste of fentanyl- 100 cc dumped in sink.Robin D witnessed waste 

## 2019-04-26 NOTE — Progress Notes (Signed)
ANTICOAGULATION CONSULT NOTE - Follow Up Consult  Pharmacy Consult for heparin Indication: pulmonary embolus  Labs: Recent Labs    04/23/19 1535  04/24/19 0302 04/24/19 1600 04/25/19 0403 04/26/19 0522  HGB 13.8   < > 12.9*  --  13.6 12.1*  HCT 39.3   < > 37.2*  --  39.2 34.9*  PLT 250  --  274  --  271 189  LABPROT 13.4  --   --   --   --   --   INR 1.0  --   --   --   --   --   HEPARINUNFRC  --    < > 0.50 0.36 0.41 0.26*  CREATININE 1.28*  --  1.13  --  1.50*  --   TROPONINI 0.25*  --   --   --   --   --    < > = values in this interval not displayed.    Assessment: 58yo male now subtherapeutic on heparin after three levels at goal; no gtt issues bleeding per RN though he reports that pt experienced minor bleeding w/ BM ("smear" of blood) and Hgb is trending back down.  Goal of Therapy:  Heparin level 0.3-0.7 units/ml   Plan:  Will increase heparin gtt very slightly to 1200 units/hr and check level in 6 hours.    Wynona Neat, PharmD, BCPS  04/26/2019,6:03 AM

## 2019-04-27 ENCOUNTER — Inpatient Hospital Stay (HOSPITAL_COMMUNITY): Payer: Managed Care, Other (non HMO)

## 2019-04-27 DIAGNOSIS — I425 Other restrictive cardiomyopathy: Secondary | ICD-10-CM

## 2019-04-27 LAB — COOXEMETRY PANEL
Carboxyhemoglobin: 1.4 % (ref 0.5–1.5)
Methemoglobin: 0.6 % (ref 0.0–1.5)
O2 Saturation: 57.8 %
Total hemoglobin: 15.2 g/dL (ref 12.0–16.0)

## 2019-04-27 LAB — COMPREHENSIVE METABOLIC PANEL
ALT: 31 U/L (ref 0–44)
AST: 18 U/L (ref 15–41)
Albumin: 1.8 g/dL — ABNORMAL LOW (ref 3.5–5.0)
Alkaline Phosphatase: 81 U/L (ref 38–126)
Anion gap: 10 (ref 5–15)
BUN: 63 mg/dL — ABNORMAL HIGH (ref 6–20)
CO2: 30 mmol/L (ref 22–32)
Calcium: 8.4 mg/dL — ABNORMAL LOW (ref 8.9–10.3)
Chloride: 93 mmol/L — ABNORMAL LOW (ref 98–111)
Creatinine, Ser: 2.47 mg/dL — ABNORMAL HIGH (ref 0.61–1.24)
GFR calc Af Amer: 32 mL/min — ABNORMAL LOW (ref >60)
GFR calc non Af Amer: 27 mL/min — ABNORMAL LOW (ref >60)
Glucose, Bld: 116 mg/dL — ABNORMAL HIGH (ref 70–99)
Potassium: 3.6 mmol/L (ref 3.5–5.1)
Sodium: 133 mmol/L — ABNORMAL LOW (ref 135–145)
Total Bilirubin: 0.9 mg/dL (ref 0.3–1.2)
Total Protein: 5.2 g/dL — ABNORMAL LOW (ref 6.5–8.1)

## 2019-04-27 LAB — MAGNESIUM: Magnesium: 2.1 mg/dL (ref 1.7–2.4)

## 2019-04-27 LAB — HEPARIN LEVEL (UNFRACTIONATED): Heparin Unfractionated: <0.1 IU/mL — ABNORMAL LOW (ref 0.30–0.70)

## 2019-04-27 LAB — CBC
HCT: 34.2 % — ABNORMAL LOW (ref 39.0–52.0)
Hemoglobin: 11.8 g/dL — ABNORMAL LOW (ref 13.0–17.0)
MCH: 37.9 pg — ABNORMAL HIGH (ref 26.0–34.0)
MCHC: 34.5 g/dL (ref 30.0–36.0)
MCV: 110 fL — ABNORMAL HIGH (ref 80.0–100.0)
Platelets: 181 10*3/uL (ref 150–400)
RBC: 3.11 MIL/uL — ABNORMAL LOW (ref 4.22–5.81)
RDW: 15.3 % (ref 11.5–15.5)
WBC: 4.5 10*3/uL (ref 4.0–10.5)
nRBC: 0 % (ref 0.0–0.2)

## 2019-04-27 LAB — GLUCOSE, CAPILLARY
Glucose-Capillary: 131 mg/dL — ABNORMAL HIGH (ref 70–99)
Glucose-Capillary: 164 mg/dL — ABNORMAL HIGH (ref 70–99)
Glucose-Capillary: 109 mg/dL — ABNORMAL HIGH (ref 70–99)
Glucose-Capillary: 125 mg/dL — ABNORMAL HIGH (ref 70–99)

## 2019-04-27 LAB — PHOSPHORUS: Phosphorus: 4.3 mg/dL (ref 2.5–4.6)

## 2019-04-27 MED ORDER — FAMOTIDINE IN NACL 20-0.9 MG/50ML-% IV SOLN: 20.0000 mg | INTRAVENOUS | Status: DC

## 2019-04-27 MED ORDER — POTASSIUM CHLORIDE CRYS ER 20 MEQ PO TBCR
40.0000 meq | EXTENDED_RELEASE_TABLET | Freq: Once | ORAL | Status: AC
  Administered 2019-04-27: 10:00:00 40 meq via ORAL
  Filled 2019-04-27: qty 2

## 2019-04-27 MED ORDER — HEPARIN (PORCINE) 25000 UT/250ML-% IV SOLN
1800.0000 [IU]/h | INTRAVENOUS | Status: DC
  Administered 2019-04-27 (×2): 1450 [IU]/h via INTRAVENOUS
  Administered 2019-04-28: 12:00:00 1700 [IU]/h via INTRAVENOUS
  Administered 2019-04-29: 2000 [IU]/h via INTRAVENOUS
  Administered 2019-04-29: 1900 [IU]/h via INTRAVENOUS
  Administered 2019-04-30 (×2): 2000 [IU]/h via INTRAVENOUS
  Administered 2019-05-01: 1800 [IU]/h via INTRAVENOUS
  Filled 2019-04-27 (×7): qty 250

## 2019-04-27 NOTE — Progress Notes (Signed)
Advanced Heart Failure Rounding Note   Subjective:    Sitting up in bed. S/p right thoracentesis yesterday. Feels better. Less sob. Still with some orthopnea.   CVP 8-9. On heparin. No bleeding. Co-ox 58% SBP 80-90s on midodrine.   Objective:   Weight Range:  Vital Signs:   Temp:  [97.5 F (36.4 C)-98.2 F (36.8 C)] 97.6 F (36.4 C) (06/12 1100) Pulse Rate:  [78-87] 87 (06/12 0600) Resp:  [16-25] 20 (06/12 0600) BP: (84-95)/(59-70) 91/62 (06/12 0600) SpO2:  [94 %-99 %] 95 % (06/12 0600) Weight:  [70.6 kg] 70.6 kg (06/12 0428) Last BM Date: 04/26/19  Weight change: Filed Weights   04/24/19 0351 04/25/19 0431 04/27/19 0428  Weight: 70.9 kg 66.2 kg 70.6 kg    Intake/Output:   Intake/Output Summary (Last 24 hours) at 04/27/2019 1401 Last data filed at 04/27/2019 1300 Gross per 24 hour  Intake 500.03 ml  Output 330 ml  Net 170.03 ml     Physical Exam: General:  Sitting up in bed. weak No resp difficulty HEENT: normal multiple ecchymosis Neck: supple. JVP 8-9 . Carotids 2+ bilat; no bruits. No lymphadenopathy or thryomegaly appreciated. Cor: PMI nondisplaced. Regular rate & rhythm. 2/6 TR   Left subclavian TLC Lungs: decreased R base dull 1/2 up on L Abdomen: soft, nontender, nondistended. No hepatosplenomegaly. No bruits or masses. Good bowel sounds. Extremities: no cyanosis, clubbing, rash, tr edema Neuro: alert & orientedx3, cranial nerves grossly intact. moves all 4 extremities w/o difficulty. Affect pleasant   Telemetry: Sinus 80s with 1AVB Personally reviewed   Labs: Basic Metabolic Panel: Recent Labs  Lab 04/23/19 1535  04/23/19 1633 04/23/19 2048 04/24/19 0302 04/24/19 0924 04/25/19 0403 04/26/19 0522 04/27/19 0422  NA 136   < >  --  132* 137  --  137 136 133*  K 3.2*   < >  --  3.4* 3.7  --  4.2 4.2 3.6  CL 89*  --   --   --  94*  --  95* 94* 93*  CO2 33*  --   --   --  32  --  _0 GLUCOSE 126*  --   --   --  135*  --  126* 99 116*   BUN 46*  --   --   --  37*  --  38* 53* 63*  CREATININE 1.28*  --   --   --  1.13  --  1.50* 2.26* 2.47*  CALCIUM 8.8*  --   --   --  8.4*  --  8.8* 8.7* 8.4*  MG  --   --  2.1  --   --  1.9 2.0 2.1 2.1  PHOS  --   --   --   --   --   --   --  5.4* 4.3   < > = values in this interval not displayed.    Liver Function Tests: Recent Labs  Lab 04/23/19 1535 04/24/19 0302 04/25/19 0403 04/26/19 0522 04/26/19 1331 04/27/19 0422  AST 44* _1 --  18  ALT 53* 48* 37 34  --  31  ALKPHOS 114 110 98 92  --  81  BILITOT 0.7 0.6 0.6 0.9  --  0.9  PROT 5.9* 5.3* 5.5* 5.3* 5.3* 5.2*  ALBUMIN 2.3* 2.0* 1.9* 1.9*  --  1.8*   No results for input(s): LIPASE, AMYLASE in the last 168 hours. No results for input(s): AMMONIA  in the last 168 hours.  CBC: Recent Labs  Lab 04/23/19 1535  04/23/19 2048 04/24/19 0302 04/25/19 0403 04/26/19 0522 04/27/19 0422  WBC 5.8  --   --  7.0 5.6 3.7* 4.5  NEUTROABS 4.5  --   --   --   --  3.3  --   HGB 13.8   < > 11.6* 12.9* 13.6 12.1* 11.8*  HCT 39.3   < > 34.0* 37.2* 39.2 34.9* 34.2*  MCV 110.7*  --   --  109.4* 111.4* 111.1* 110.0*  PLT 250  --   --  274 271 189 181   < > = values in this interval not displayed.    Cardiac Enzymes: Recent Labs  Lab 04/23/19 1535  TROPONINI 0.25*    BNP: BNP (last 3 results) Recent Labs    04/23/19 1537 04/25/19 0404  BNP 2,502.3* 2,888.1*    ProBNP (last 3 results) No results for input(s): PROBNP in the last 8760 hours.    Other results:  Imaging: Dg Chest Port 1 View  Result Date: 04/27/2019 CLINICAL DATA:  Respiratory failure EXAM: PORTABLE CHEST 1 VIEW COMPARISON:  04/26/2019 FINDINGS: Cardiac shadow is stable. Left subclavian central venous line is again noted and stable. Bilateral pleural effusions and bibasilar infiltrates are again noted and stable. No pneumothorax is seen. Mild pulmonary edema is noted as well. IMPRESSION: Stable pulmonary edema with small bilateral pleural  effusions. No pneumothorax is noted. Electronically Signed   By: Inez Catalina M.D.   On: 04/27/2019 08:04   Dg Chest Port 1 View  Result Date: 04/26/2019 CLINICAL DATA:  Status post right thoracentesis. EXAM: PORTABLE CHEST 1 VIEW COMPARISON:  Chest x-ray from same day at 5:15 a.m. FINDINGS: Unchanged left subclavian central venous catheter. Stable cardiomediastinal silhouette. Unchanged pulmonary edema. Interval decrease in size of the now small right pleural effusion. Unchanged moderate left pleural effusion. Persistent bibasilar atelectasis, slightly improved on the right. No pneumothorax. IMPRESSION: 1. Decreased now small right pleural effusion status post thoracentesis. No pneumothorax. 2. Unchanged pulmonary edema and moderate left pleural effusion. 3. Bibasilar atelectasis, mildly improved on the right. Electronically Signed   By: Titus Dubin M.D.   On: 04/26/2019 15:00   Dg Chest Port 1 View  Result Date: 04/26/2019 CLINICAL DATA:  Respiratory failure.  Extubated. EXAM: PORTABLE CHEST 1 VIEW COMPARISON:  04/25/2019 FINDINGS: Endotracheal tube and orogastric tube have been removed. Left subclavian central line remains in place with tip at the SVC RA junction. Persistent bilateral effusions with bilateral lower lobe atelectasis and or pneumonia. Similar appearance to yesterday other than extubation. IMPRESSION: Endotracheal tube and orogastric tube removed. Persistent bilateral effusions with lower lobe atelectasis and or pneumonia. Electronically Signed   By: Nelson Chimes M.D.   On: 04/26/2019 08:15     Medications:     Scheduled Medications: . Chlorhexidine Gluconate Cloth  6 each Topical Q0600  . feeding supplement (ENSURE ENLIVE)  237 mL Oral BID BM  . mouth rinse  15 mL Mouth Rinse BID  . midodrine  5 mg Oral TID WC    Infusions: . sodium chloride Stopped (04/25/19 0936)  . heparin Stopped (04/27/19 1146)    PRN Medications:    Assessment/Plan:   1. Acute hypoxic  respiratory failure - likely multifactorial due to acute PE, HF and large bilateral effusions - CCM managing.  - CVP 8-9. Creatinine increasing 1.5 -> 2.26 -> 2.47. With RV strain need to be careful with diuresis as he is preload  dependent Will continue to hold lasix for now.  - He is s/p R thora on 6/11. Plan for left thora today - Will watch over weekend. If effusions recur (have been chronic for him). Will need to consider bilateral Pleurex tubes. I have d/w Dr. Cheryl Flash (his cardiologist at [facility]) and Dr. Prescott Gum  2. 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 (58%)  - Hold lasix with AKI   3.  Acute bilateral pulmonary embolism: - Continue heparin for now until we make decision on Pleurex tubes -> Apixaban - Managed per PCCM - u/s LE: findings consistent with age indeterminate deep vein thrombosis involving the left peroneal vein  4.  Hypotension: -Off pressors currently. SBP stable 80-90. Continue midodrine  5.  VT - occurred in setting of respiratory distress. Now quiescent on IV amio - Rhythm stable but has very marked AV block (not uncommon with amyloid) - Off amio.  - Follow on tele. Keep K> 4.0 Mg > 2.0  6. Pleural effusions - this has been chronic problem for him R>L - doubt we can diurese him enough to improve these - -He is s/p R thora on 6/11. Plan for left thora today - Will watch over weekend. If effusions recur (have been chronic for him). Will need to consider bilateral Pleurex tubes. I have d/w Dr. Cheryl Flash (his cardiologist at [facility]) and Dr.  Prescott Gum  7. 1st degree AV block > 390m - off amio. follow  8. Multiple myeloma with AL amyloidosis - therapy currently on hold - may need stress dose steroids  9. AKI - see above. Hold lasix. Keep MAPs > 60    Length of Stay: 4   DGlori BickersMD 04/27/2019, 2:01 PM  Advanced Heart Failure Team Pager [phone](M-F; 7a - 4p)  Please contact [facility] for night-coverage after hours (4p -7a ) and weekends on [email or web address]

## 2019-04-27 NOTE — Progress Notes (Signed)
ANTICOAGULATION CONSULT NOTE   Pharmacy Consult for Heparin Indication: pulmonary embolus  No Known Allergies  Patient Measurements: Height: 6\' 2"  (188 cm) Weight: 155 lb 10.3 oz (70.6 kg) IBW/kg (Calculated) : 82.2  Heparin dosing weight: 70.6kg  Vital Signs: Temp: 97.6 F (36.4 C) (06/12 1100) Temp Source: Oral (06/12 1100) BP: 91/62 (06/12 0600) Pulse Rate: 87 (06/12 0600)  Labs: Recent Labs    04/25/19 0403 04/26/19 0522 04/26/19 2153 04/27/19 0422 04/27/19 1142  HGB 13.6 12.1*  --  11.8*  --   HCT 39.2 34.9*  --  34.2*  --   PLT 271 189  --  181  --   HEPARINUNFRC 0.41 0.26* <0.10*  --  <0.10*  CREATININE 1.50* 2.26*  --  2.47*  --     Estimated Creatinine Clearance: 31.8 mL/min (A) (by C-G formula based on SCr of 2.47 mg/dL (H)).   Medical History: History reviewed. No pertinent past medical history.  Assessment: Erik Costa is a 58yo male admitted with pulmonary embolism. Pharmacy consulted to start heparin infusion. Chest CT: Acute pulmonary embolus involving RUL and RLL. Clot burden is small to moderate. Positive for acute PE with CT evidence of right heart strain. RV/LV Ratio = 1.1. Reviewed outpatient notes from [facility] and patient not on prior anticoagulation.  Heparin level prior to thoracentesis was undetectable prior to L thoracentesis this afternoon (removed 1.6L today). CBC stable today. No bleeding reported.  Goal of Therapy:  Heparin level 0.3-0.7 units/ml Monitor platelets by anticoagulation protocol: Yes   Plan:  Restart heparin infusion 4 hours after thoracentesis Will resume heparin infusion at 1450 units/hr - get level 6 hours after restart Monitor daily heparin level, CBC, s/sx of bleeding  Antonietta Jewel, PharmD, [place] Clinical Pharmacist  Pager: [phone] Phone: [phone] 04/27/2019       3:04 PM

## 2019-04-27 NOTE — Procedures (Signed)
Thoracentesis Procedure Note  Pre-operative Diagnosis: Pleural effusion   Post-operative Diagnosis: same  Indications: LEFT pleural effusion  Procedure Details  Consent: Informed consent was obtained. Risks of the procedure were discussed including: infection, bleeding, pain, pneumothorax.  Under sterile conditions the patient was positioned. Betadine solution and sterile drapes were utilized.  1% plain lidocaine was used to anesthetize the left 7th  rib space. Fluid was obtained without any difficulties and minimal blood loss.  A dressing was applied to the wound and wound care instructions were provided.   Findings 1600 ml of clear pleural fluid was obtained.   Complications:  None; patient tolerated the procedure well.          Condition: stable  Plan A follow up chest x-ray was ordered. Bed Rest for 2 hours. Tylenol 650 mg. for pain.  Attending Attestation: I performed the procedure.  Leanna Sato Elsworth Soho MD

## 2019-04-27 NOTE — Progress Notes (Signed)
Pt transferred from 2M05 to 6E29, VSS, no apparent distress.

## 2019-04-27 NOTE — Progress Notes (Signed)
NAME:  Erik Costa, MRN:  [id number], DOB:  1961-03-31, LOS: 4 ADMISSION DATE:  04/23/2019, CONSULTATION DATE: 04/23/2019 REFERRING MD:  ER, CHIEF COMPLAINT: Sudden syncope  Brief History   Patient is a 58 year old with systemic amyloidosis treated at [facility] to the emergency room today with sudden collapse found to have pulmonary embolus on CT scan of the chest.  History of present illness   She is a 58 year old diagnosed in December of last year with amyloidosis.  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  He is followed at [facility].  He is treated with cyclophosphamide, Velcade, dexamethasone.  He was coming home from a cardiology appointment.  He has been told that he does have significant amyloid involvement in his heart.  He previously has had very large according to his wife 5 L pleural effusions that have required thoracentesis and was told this was due to amyloid involvement of his heart.  She says his ejection fraction is 50% presumably with reduced left ventricular chamber size and significant systolic dysfunction.  CT scan of the chest showed small to moderate clot burden in the left lung.  He did have documented runs of ventricular tachycardia on arrival and required intubation due to hypoxemia and arrhythmias.  He currently is on amiodarone receiving fentanyl with Precedex being titrated off intermittent Versed and heparin drip is about to be started.  I discussed with his wife at length the possibility of using TPA due to the fact that he is on Levophed at 8 mics for hypotension.  I believe his hypotension is in part due to his systolic dysfunction in combination with his PE and with his systemic amyloidosis I do not know with the increased risk of significant or active complications might be.  Because of this we decided to withhold TPA and use high-dose heparin.  His blood pressure currently is about 100/60 on the 8  mics of Levophed.  He also is on amiodarone drip. His wife does tell me that he has had significant issues with worsening stamina over the last month or 2 decreased ability to get around presumably due to progressive amyloid.  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 Clot burden is small to moderate. Positive for acute PE with CT evidence of right heart strain (RV/LV Ratio = 1.1) consistent with at least submassive (intermediate risk) PE. The presence of right heart strain has been associated with an increased risk of morbidity and mortality. Please activate Code PE by paging [phone]. 2. Left ventricular hypertrophy and mild cardiomegaly. 3. Large bilateral pleural effusions with passive atelectasis. Only a minority of the lung is aerated. This may reduce overall respiratory reserve. 4. Third spacing of fluid with edema in mediastinal and subcutaneous tissues. Secondary pulmonary lobular interstitial accentuation in the lungs compatible with interstitial pulmonary edema. 5. Age-indeterminate mild superior endplate compression at T10.    Micro Data:  na  Antimicrobials:  na  Interim history/subjective:  Currently awake and alert.  Blood pressure is on the soft side but midodrine  been initiated. Objective  Blood pressure 91/62, pulse 87, temperature 98.2 F (36.8 C), temperature source Oral, resp. rate 20, height 6\' 2"  (1.88 m), weight 70.6 kg, SpO2 95 %.        Intake/Output Summary (Last 24 hours) at 04/27/2019 0911 Last data filed at 04/27/2019 0600  Gross per 24 hour  Intake 691.05 ml  Output 430 ml  Net 261.05 ml   Filed Weights   04/24/19 0351 04/25/19 0431 04/27/19 0428  Weight: 70.9 kg 66.2 kg 70.6 kg    Examination: General: Frail male who looks older than stated age but is much improved pulmonary status following thoracentesis HEENT: No JVD or lymphadenopathy is appreciated Neuro: Grossly intact CV: s1s2 rrr, no m/r/g heart block is noted PULM: even/non-labored, lungs bilaterally in his bases XV:QMGQ, non-tender, bsx4 active  Extremities: warm/dry, 3+ edema  Skin: no rashes or lesions  Resolved Hospital Problem list   na  Assessment & Plan:  Pulmonary embolus: Right heart strain per CT   EF 50% per last Echo  BNP of  2500 on admission  Unknown risk of potential bleeding due to his systemic amyloidosis  DVT  Plan  Continued heparin for now.  Would not transition to Coumadin at this point until it is determined whether not he will need a repeat thoracentesis Successfully extubated Transition to cardiac progressive care Will have triad of cardiology takeover as of 04/28/2019   V tach on admission Amyloidosis with cards involvement Plan Cardiac monitoring Magnesium greater than 2 Potassium greater than 4 Cardiology is following  Acute Respiratory Failure CXR with Pulmonary edema Effusions per CXR Plan 04/27/2019 pulmonary status much better following thoracentesis on the right on 04/26/2011 for 1500 cc Chest x-ray reviewed 04/27/2019 demonstrates bilateral pleural effusions with a much improved right pleural effusion Status post thoracentesis on the right   Hypotension 2/2 systolic dysfunction in combination with his PE and with his systemic amyloidosis  Plan Off vasopressor support Chronically low blood pressure Midodrine has been initiated per cardiology   Amyloidosis:  Cardiac and renal involvement Followed at [facility] Rapidly progressive despite aggressive therapy Currently will withhold  cyclophosphamide.Takes dexamethasone every Friday  Plan:  Currently off vasopressor support Note he normally receives chemotherapy every 2 weeks on Friday and is due for chemo today.  We will hold due to his weakened condition.   Renal Amyloid Chronic Kidney Injury Hypokalemia Lab Results  Component Value Date   CREATININE 2.47 (H) 04/27/2019   CREATININE 2.26 (H) 04/26/2019   CREATININE 1.50 (H) 04/25/2019   Recent Labs  Lab 04/25/19 0403 04/26/19 0522 04/27/19 0422  K 4.2 4.2 3.6    Plan Strict intake and output Monitor replete electrolytes as needed Monitor creatinine note he continues to climb despite diuretics being discontinued Hold further diuresis   GI Continue heart healthy diet  Best practice:  Diet: Heart healthy diet Pain/Anxiety/Delirium protocol (if indicated):  Fentanyl prn VAP protocol (if indicated): yes DVT prophylaxis: heparin GI prophylaxis: pepcid Glucose control: monitor CBG Mobility: bedrest Code Status: full Family Communication: 04/27/2019 patient updated at bedside Disposition: Currently in intensive care unit.  We will transition him out of intensive care to cardiac progressive care.  He is being followed by cardiology.  We have inquired as to whether cardiology would like to take on their service.  Labs   CBC: Recent Labs  Lab 04/23/19 1535  04/23/19 2048 04/24/19 0302 04/25/19 0403 04/26/19 0522 04/27/19 0422  WBC 5.8  --   --  7.0 5.6 3.7* 4.5  NEUTROABS 4.5  --   --   --   --  3.3  --   HGB 13.8   < > 11.6* 12.9* 13.6 12.1* 11.8*  HCT 39.3   < > 34.0* 37.2* 39.2 34.9* 34.2*  MCV 110.7*  --   --  109.4* 111.4* 111.1* 110.0*  PLT 250  --   --  274 271 189 181   < > = values in this interval not displayed.    Basic Metabolic Panel: Recent Labs  Lab 04/23/19 1535  04/23/19 1633 04/23/19 2048 04/24/19 0302 04/24/19 0924 04/25/19 0403 04/26/19 0522 04/27/19 0422  NA 136   < >  --  132* 137  --  137 136 133*  K 3.2*    < >  --  3.4* 3.7  --  4.2 4.2 3.6  CL 89*  --   --   --  94*  --  95* 94* 93*  CO2 33*  --   --   --  32  --  31 29 30   GLUCOSE 126*  --   --   --  135*  --  126* 99 116*  BUN 46*  --   --   --  37*  --  38* 53* 63*  CREATININE 1.28*  --   --   --  1.13  --  1.50* 2.26* 2.47*  CALCIUM 8.8*  --   --   --  8.4*  --  8.8* 8.7* 8.4*  MG  --   --  2.1  --   --  1.9 2.0 2.1 2.1  PHOS  --   --   --   --   --   --   --  5.4* 4.3   < > = values in this interval not displayed.   GFR: Estimated Creatinine Clearance: 31.8 mL/min (A) (by C-G formula based on SCr of 2.47 mg/dL (H)). Recent Labs  Lab 04/23/19 1537 04/23/19 1837 04/24/19 0302 04/24/19 0923 04/25/19 0403 04/26/19 0522 04/27/19 0422  WBC  --   --  7.0  --  5.6 3.7* 4.5  LATICACIDVEN 2.1* 4.3*  --  1.1  --   --   --     Liver Function Tests: Recent Labs  Lab 04/23/19 1535 04/24/19 0302 04/25/19 0403 04/26/19 0522 04/26/19 1331 04/27/19 0422  AST 44* 31 22 24   --  18  ALT 53* 48* 37 34  --  31  ALKPHOS 114 110 98 92  --  81  BILITOT 0.7 0.6 0.6 0.9  --  0.9  PROT 5.9* 5.3* 5.5* 5.3* 5.3* 5.2*  ALBUMIN 2.3* 2.0* 1.9* 1.9*  --  1.8*   No results for input(s): LIPASE, AMYLASE in the last 168 hours. No results for input(s): AMMONIA in the last 168 hours.  ABG    Component Value Date/Time   PHART 7.672 (HH) 04/23/2019 2048   PCO2ART 29.3 (L) 04/23/2019 2048   PO2ART 118.0 (H) 04/23/2019 2048   HCO3 34.0 (H) 04/23/2019 2048   TCO2 35 (H) 04/23/2019 2048   O2SAT 57.8 04/27/2019 0425     Coagulation Profile: Recent Labs  Lab 04/23/19 1535  INR 1.0    Cardiac Enzymes: Recent Labs  Lab 04/23/19 1535  TROPONINI 0.25*    HbA1C: No results found for: HGBA1C  CBG: Recent Labs  Lab 04/25/19 2330 04/26/19 0347 04/26/19 0735 04/26/19 1338 04/27/19 0732  GLUCAP 136* 93 94 148* 131*     App Critical  care time: 35 minutes spent in evaluation and critical care planning    [facility] ACNP Maryanna Shape  PCCM Pager [phone] till 1 pm If no answer page [phone] 04/27/2019, 9:11 AM    App

## 2019-04-27 NOTE — Progress Notes (Deleted)
Patient has had numerous elevated BP. Rotated BP cuff placement with no decrease. Attempted to decrease patient agitation through repositioning, suctioning, titration of sedation, and PRN pushes. Called and notified Elink. See orders. Orders carried out. Will continue to monitor. 

## 2019-04-28 DIAGNOSIS — E43 Unspecified severe protein-calorie malnutrition: Secondary | ICD-10-CM

## 2019-04-28 DIAGNOSIS — I5033 Acute on chronic diastolic (congestive) heart failure: Secondary | ICD-10-CM

## 2019-04-28 LAB — COMPREHENSIVE METABOLIC PANEL
ALT: 29 U/L (ref 0–44)
AST: 20 U/L (ref 15–41)
Albumin: 1.7 g/dL — ABNORMAL LOW (ref 3.5–5.0)
Alkaline Phosphatase: 79 U/L (ref 38–126)
Anion gap: 10 (ref 5–15)
BUN: 62 mg/dL — ABNORMAL HIGH (ref 6–20)
CO2: 29 mmol/L (ref 22–32)
Calcium: 8.5 mg/dL — ABNORMAL LOW (ref 8.9–10.3)
Chloride: 96 mmol/L — ABNORMAL LOW (ref 98–111)
Creatinine, Ser: 2.33 mg/dL — ABNORMAL HIGH (ref 0.61–1.24)
GFR calc Af Amer: 34 mL/min — ABNORMAL LOW (ref >60)
GFR calc non Af Amer: 29 mL/min — ABNORMAL LOW (ref >60)
Glucose, Bld: 115 mg/dL — ABNORMAL HIGH (ref 70–99)
Potassium: 4 mmol/L (ref 3.5–5.1)
Sodium: 135 mmol/L (ref 135–145)
Total Bilirubin: 1 mg/dL (ref 0.3–1.2)
Total Protein: 5.1 g/dL — ABNORMAL LOW (ref 6.5–8.1)

## 2019-04-28 LAB — CULTURE, BLOOD (ROUTINE X 2)
Culture: NO GROWTH
Culture: NO GROWTH
Special Requests: ADEQUATE

## 2019-04-28 LAB — PHOSPHORUS: Phosphorus: 3.5 mg/dL (ref 2.5–4.6)

## 2019-04-28 LAB — HEPARIN LEVEL (UNFRACTIONATED)
Heparin Unfractionated: 0.12 IU/mL — ABNORMAL LOW (ref 0.30–0.70)
Heparin Unfractionated: 0.32 IU/mL (ref 0.30–0.70)
Heparin Unfractionated: 0.22 IU/mL — ABNORMAL LOW (ref 0.30–0.70)

## 2019-04-28 LAB — CBC
HCT: 33.5 % — ABNORMAL LOW (ref 39.0–52.0)
Hemoglobin: 11.8 g/dL — ABNORMAL LOW (ref 13.0–17.0)
MCH: 37.9 pg — ABNORMAL HIGH (ref 26.0–34.0)
MCHC: 35.2 g/dL (ref 30.0–36.0)
MCV: 107.7 fL — ABNORMAL HIGH (ref 80.0–100.0)
Platelets: 203 10*3/uL (ref 150–400)
RBC: 3.11 MIL/uL — ABNORMAL LOW (ref 4.22–5.81)
RDW: 14.7 % (ref 11.5–15.5)
WBC: 4.9 10*3/uL (ref 4.0–10.5)
nRBC: 0 % (ref 0.0–0.2)

## 2019-04-28 LAB — CHOLESTEROL, BODY FLUID: Cholesterol, Fluid: 22 mg/dL

## 2019-04-28 LAB — RHEUMATOID FACTORS, FLUID: Rheumatoid Arthritis, Qn/Fluid: NEGATIVE

## 2019-04-28 LAB — MAGNESIUM: Magnesium: 2.1 mg/dL (ref 1.7–2.4)

## 2019-04-28 NOTE — Progress Notes (Signed)
ANTICOAGULATION CONSULT NOTE   Pharmacy Consult for Heparin Indication: pulmonary embolus  No Known Allergies  Patient Measurements: Height: 6\' 2"  (188 cm) Weight: 156 lb 12.7 oz (71.1 kg) IBW/kg (Calculated) : 82.2  Heparin dosing weight: 70.6kg  Vital Signs: Temp: 98 F (36.7 C) (06/13 2000) Temp Source: Oral (06/13 2000) BP: 91/66 (06/13 2000) Pulse Rate: 83 (06/13 2000)  Labs: Recent Labs    04/26/19 0522  04/27/19 0422  04/28/19 0043 04/28/19 1122 04/28/19 2135  HGB 12.1*  --  11.8*  --  11.8*  --   --   HCT 34.9*  --  34.2*  --  33.5*  --   --   PLT 189  --  181  --  203  --   --   HEPARINUNFRC 0.26*   < >  --    < > 0.12* 0.32 0.22*  CREATININE 2.26*  --  2.47*  --  2.33*  --   --    < > = values in this interval not displayed.    Estimated Creatinine Clearance: 33.9 mL/min (A) (by C-G formula based on SCr of 2.33 mg/dL (H)).   Medical History: History reviewed. No pertinent past medical history.  Assessment: Erik Costa is a 58yo male admitted with pulmonary embolism. Pharmacy consulted to start heparin infusion. Chest CT: Acute pulmonary embolus involving RUL and RLL. Clot burden is small to moderate. Positive for acute PE with CT evidence of right heart strain. RV/LV Ratio = 1.1. Reviewed outpatient [facility] from [facility] and patient not on prior anticoagulation.Patient s/p thoracentesis 6/12  Repeat heparin level slightly below goal, no issues per RN.  Goal of Therapy:  Heparin level 0.3-0.7 units/ml Monitor platelets by anticoagulation protocol: Yes   Plan:  Increase heparin to 1900 units/hr Recheck heparin level in [place], PharmD, BCPS Clinical Pharmacist [phone] Please check AMION for all [place] numbers 04/28/2019

## 2019-04-28 NOTE — Progress Notes (Signed)
ANTICOAGULATION CONSULT NOTE - Follow Up Consult  Pharmacy Consult for heparin Indication: pulmonary embolus  Labs: Recent Labs    04/26/19 0522 04/26/19 2153 04/27/19 0422 04/27/19 1142 04/28/19 0043  HGB 12.1*  --  11.8*  --  11.8*  HCT 34.9*  --  34.2*  --  33.5*  PLT 189  --  181  --  203  HEPARINUNFRC 0.26* <0.10*  --  <0.10* 0.12*  CREATININE 2.26*  --  2.47*  --  2.33*    Assessment: 58yo male subtherapeutic on heparin after resumed post thoracentesis; no gtt issues or signs of bleeding per RN.  Goal of Therapy:  Heparin level 0.3-0.7 units/ml   Plan:  Will increase heparin gtt by ~3 units/kg/hr to 1700 units/hr and check level in 8 hours.    Wynona Neat, PharmD, BCPS  04/28/2019,2:00 AM

## 2019-04-28 NOTE — Progress Notes (Signed)
ANTICOAGULATION CONSULT NOTE   Pharmacy Consult for Heparin Indication: pulmonary embolus  No Known Allergies  Patient Measurements: Height: 6\' 2"  (188 cm) Weight: 156 lb 12.7 oz (71.1 kg) IBW/kg (Calculated) : 82.2  Heparin dosing weight: 70.6kg  Vital Signs: Temp: 97.4 F (36.3 C) (06/13 1143) Temp Source: Oral (06/13 1143) BP: 73/60 (06/13 1143) Pulse Rate: 83 (06/13 1143)  Labs: Recent Labs    04/26/19 0522  04/27/19 0422 04/27/19 1142 04/28/19 0043 04/28/19 1122  HGB 12.1*  --  11.8*  --  11.8*  --   HCT 34.9*  --  34.2*  --  33.5*  --   PLT 189  --  181  --  203  --   HEPARINUNFRC 0.26*   < >  --  <0.10* 0.12* 0.32  CREATININE 2.26*  --  2.47*  --  2.33*  --    < > = values in this interval not displayed.    Estimated Creatinine Clearance: 33.9 mL/min (A) (by C-G formula based on SCr of 2.33 mg/dL (H)).   Medical History: History reviewed. No pertinent past medical history.  Assessment: Erik Costa is a 58yo male admitted with pulmonary embolism. Pharmacy consulted to start heparin infusion. Chest CT: Acute pulmonary embolus involving RUL and RLL. Clot burden is small to moderate. Positive for acute PE with CT evidence of right heart strain. RV/LV Ratio = 1.1. Reviewed outpatient notes from [facility] and patient not on prior anticoagulation.Patient s/p thoracentesis 6/12  Heparin level therapeutic today at 0.32 on heparin 1700 units/hr. CBC remains stable, Hgb 11.8, HCT 33.5, and pltc 203. No bleeding reported.  Goal of Therapy:  Heparin level 0.3-0.7 units/ml Monitor platelets by anticoagulation protocol: Yes   Plan:  Continue heparin gtt at 1700 units/hr Monitor daily heparin level, CBC, s/sx of bleeding  Thank you for allowing pharmacy to be a part of this patient's care.  Leron Croak, PharmD PGY1 Pharmacy Resident Phone: [phone]  Please check AMION for all [place] phone numbers 04/28/2019       12:48 PM

## 2019-04-28 NOTE — Progress Notes (Signed)
Progress Note  Patient Name: Erik Costa Date of Encounter: 04/28/2019  Primary Cardiologist: Clabe Seal  Subjective   Ongoing SOB  Inpatient Medications    Scheduled Meds: . feeding supplement (ENSURE ENLIVE)  237 mL Oral BID BM  . mouth rinse  15 mL Mouth Rinse BID  . midodrine  5 mg Oral TID WC   Continuous Infusions: . sodium chloride Stopped (04/25/19 0936)  . heparin 1,700 Units/hr (04/28/19 1145)   PRN Meds:    Vital Signs    Vitals:   04/27/19 2335 04/28/19 0457 04/28/19 0746 04/28/19 1143  BP: (!) 83/62 (!) 87/70 90/71 (!) 73/60  Pulse: 81 87 88 83  Resp:  20    Temp:  98 F (36.7 C) 98.1 F (36.7 C) (!) 97.4 F (36.3 C)  TempSrc:  Oral Oral Oral  SpO2: 94% 96% 95% 95%  Weight:  71.1 kg    Height:        Intake/Output Summary (Last 24 hours) at 04/28/2019 1152 Last data filed at 04/28/2019 1048 Gross per 24 hour  Intake 129.97 ml  Output 1300 ml  Net -1170.03 ml   Last 3 Weights 04/28/2019 04/27/2019 04/27/2019  Weight (lbs) 156 lb 12.7 oz 156 lb 12.8 oz 155 lb 10.3 oz  Weight (kg) 71.12 kg 71.124 kg 70.6 kg      Telemetry    Sinus rhythm 1st degree av block- Personally Reviewed  ECG    na - Personally Reviewed  Physical Exam   GEN: No acute distress.   Neck: No JVD Cardiac: RRR, no murmurs, rubs, or gallops.  Respiratory: decreased breath sounds bilaterally mid lung fields and down. GI: Soft, nontender, non-distended  MS: No edema; No deformity. Neuro:  Nonfocal  Psych: Normal affect   Labs    Chemistry Recent Labs  Lab 04/26/19 0522 04/26/19 1331 04/27/19 0422 04/28/19 0043  NA 136  --  133* 135  K 4.2  --  3.6 4.0  CL 94*  --  93* 96*  CO2 29  --  30 29  GLUCOSE 99  --  116* 115*  BUN 53*  --  63* 62*  CREATININE 2.26*  --  2.47* 2.33*  CALCIUM 8.7*  --  8.4* 8.5*  PROT 5.3* 5.3* 5.2* 5.1*  ALBUMIN 1.9*  --  1.8* 1.7*  AST 24  --  18 20  ALT 34  --  31 29  ALKPHOS 92  --  81 79  BILITOT 0.9  --  0.9 1.0   GFRNONAA 30*  --  27* 29*  GFRAA 35*  --  32* 34*  ANIONGAP 13  --  10 10     Hematology Recent Labs  Lab 04/26/19 0522 04/27/19 0422 04/28/19 0043  WBC 3.7* 4.5 4.9  RBC 3.14* 3.11* 3.11*  HGB 12.1* 11.8* 11.8*  HCT 34.9* 34.2* 33.5*  MCV 111.1* 110.0* 107.7*  MCH 38.5* 37.9* 37.9*  MCHC 34.7 34.5 35.2  RDW 15.5 15.3 14.7  PLT 189 181 203    Cardiac Enzymes Recent Labs  Lab 04/23/19 1535  TROPONINI 0.25*   No results for input(s): TROPIPOC in the last 168 hours.   BNP Recent Labs  Lab 04/23/19 1537 04/25/19 0404  BNP 2,502.3* 2,888.1*     DDimer No results for input(s): DDIMER in the last 168 hours.   Radiology    Dg Chest Port 1 View  Result Date: 04/27/2019 CLINICAL DATA:  Status post thoracentesis EXAM: PORTABLE CHEST 1 VIEW COMPARISON:  04/27/2019 FINDINGS: Unchanged size of small pleural effusions with associated basilar atelectasis. No pneumothorax. Mild pulmonary edema, unchanged. Normal cardiomediastinal contours. IMPRESSION: Unchanged small pleural effusions.  No pneumothorax. Electronically Signed   By: Ulyses Jarred M.D.   On: 04/27/2019 14:18   Dg Chest Port 1 View  Result Date: 04/27/2019 CLINICAL DATA:  Respiratory failure EXAM: PORTABLE CHEST 1 VIEW COMPARISON:  04/26/2019 FINDINGS: Cardiac shadow is stable. Left subclavian central venous line is again noted and stable. Bilateral pleural effusions and bibasilar infiltrates are again noted and stable. No pneumothorax is seen. Mild pulmonary edema is noted as well. IMPRESSION: Stable pulmonary edema with small bilateral pleural effusions. No pneumothorax is noted. Electronically Signed   By: Inez Catalina M.D.   On: 04/27/2019 08:04   Dg Chest Port 1 View  Result Date: 04/26/2019 CLINICAL DATA:  Status post right thoracentesis. EXAM: PORTABLE CHEST 1 VIEW COMPARISON:  Chest x-ray from same day at 5:15 a.m. FINDINGS: Unchanged left subclavian central venous catheter. Stable cardiomediastinal silhouette.  Unchanged pulmonary edema. Interval decrease in size of the now small right pleural effusion. Unchanged moderate left pleural effusion. Persistent bibasilar atelectasis, slightly improved on the right. No pneumothorax. IMPRESSION: 1. Decreased now small right pleural effusion status post thoracentesis. No pneumothorax. 2. Unchanged pulmonary edema and moderate left pleural effusion. 3. Bibasilar atelectasis, mildly improved on the right. Electronically Signed   By: Titus Dubin M.D.   On: 04/26/2019 15:00    Cardiac Studies    Patient Profile      Erik Costa is a 58 y.o. 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  Assessment & Plan    1. Acute hypoxic respiratory failure - likely multifactorial related to PE, HF, and bilateral pleural effusions - 04/2019 echo LVEF 45-50%, cannot eval diastolic function, moderate RV dysfunction. Severe LVH - avoiding aggressive diuresis given RV dysfunction, hypotension, and renal dysfunction   2. Pleural effusions - s/p thoracentesis right side 6/11, left side 6/12 - monitoring, if recurrent may need to consider Pleurex tubes - renal function and bp's will not tolerate aggressive diuresis, suspect will need pleurex placement on Monday - repeat CXR tomorrow  3. 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 Bilateral PE - on hep gtt, can convert to oral  anticoag once final decision is made on pleurex tubes   5. Hypotension - off pressors, has been on midodrine  6. VT - occurred in setting of hypoxia, respiratory distress - had been on amiodarone, now off  7. Multiple myeloma with AL amyloidosis with heart, kidney and positive fat pad involvement (dx in 2019) - Treated by hematology/oncology with cyclophosphamide,Velcade, dexamethasone     For questions or updates, please contact [place] Please consult [email or web address] for contact info under        Signed, Carlyle Dolly, MD  04/28/2019, 11:52 AM

## 2019-04-28 NOTE — Progress Notes (Signed)
PROGRESS NOTE    Erik Costa  YNW:[id number] DOB: 10-03-1961 DOA: 04/23/2019 PCP: Azzie Roup, MD   Brief Narrative: Erik Costa is a 58 y.o. male with a history of heart failure, amyloidosis, recurrent pleural effusions, multiple myeloma. He presented secondary to sudden collapse and found to have a pulmonary embolism. Course complicated by respiratory and circulatory failure requiring intubation and vasopressors. Cardiology consulted for heart failure.   Assessment & Plan:   Active Problems:   Pulmonary embolus (HCC)   Protein-calorie malnutrition, severe   Pulmonary embolism No history. Associated right heart strain on CT. Started on heparin drip -Continue heparin drip  Acute on chronic diastolic heart failure Transthoracic Echocardiogram with an EF of 45-50%. Previously diuresed with Lasix IV which has been stopped secondary to worsening AKI in addition to worsening hypotension. Cardiology/heart failure team on board -Cardiology recommendations  Acute respiratory failure with hypoxia Pulmonary edema Intubated from 6/8 to 6/10. Patient is s/p bilateral thoracentesis. Difficult to manage with diuresis secondary to hypotension. Off ox oxygen.  Hypotension Chronic and worsened. Was previously on vasopressors but weaned off. Goal MAP of >60 per cardiology. Midodrine initiated. Complicating diuresis attempts.  Amyloidosis Multiple myeloma Cardiac and renal involvement. On cyclophosphamide and Velcade therapy. Poor prognosis. Follows with [facility].  Ventricular tachycardia Per chart review, secondary to hypoxia and respiratory distress. Was treated with amiodarone which has now been discontinued.  DVT prophylaxis: Heparin drip Code Status:   Code Status: Full Code Family Communication: Wife on telephone Disposition Plan: Discharge pending cardiology recommendations   Consultants:   PCCM  Cardiology  Procedures:   6/8-6/10: ETT  6/9: Transthoracic  Echocardiogram IMPRESSIONS    1. The left ventricle has mildly reduced systolic function, with an ejection fraction of 45-50%. The cavity size was normal. There is severe concentric left ventricular hypertrophy. Left ventricular diastolic function could not be evaluated due to  indeterminate diastolic function. Elevated left ventricular end-diastolic pressure No evidence of left ventricular regional wall motion abnormalities.  2. The right ventricle has moderately reduced systolic function. The cavity was normal. There is no increase in right ventricular wall thickness. Right ventricular systolic pressure could not be assessed.  3. Right atriu is severely dilated. There is significant spontaneous echo contrast in the RA with no obvious thrombus.  4. Large pleural effusion in the left lateral region.  5. The pericardial effusion is posterior to the left ventricle.  6. Trivial pericardial effusion is present.  7. The inferior vena cava was dilated in size with <50% respiratory variability.  8. Given severe LVH, increased filling pressures and speckeled appearance of myocardium in addition to trace pericardial effusion, consider cardiac MRI to rulw out amyloidosis.  Antimicrobials:  None    Subjective: Some shortness of breath. No chest pain.  Objective: Vitals:   04/27/19 2335 04/28/19 0457 04/28/19 0746 04/28/19 1143  BP: (!) 83/62 (!) 87/70 90/71 (!) 73/60  Pulse: 81 87 88 83  Resp:  20    Temp:  98 F (36.7 C) 98.1 F (36.7 C) (!) 97.4 F (36.3 C)  TempSrc:  Oral Oral Oral  SpO2: 94% 96% 95% 95%  Weight:  71.1 kg    Height:        Intake/Output Summary (Last 24 hours) at 04/28/2019 1341 Last data filed at 04/28/2019 1048 Gross per 24 hour  Intake 120 ml  Output 1225 ml  Net -1105 ml   Filed Weights   04/27/19 0428 04/27/19 2051 04/28/19 0457  Weight: 70.6 kg 71.1 kg  71.1 kg    Examination:  General exam: Appears calm and comfortable Respiratory system: Rales  bilaterally. Respiratory effort normal. Cardiovascular system: S1 & S2 heard, RRR. No murmurs, rubs, gallops or clicks. Gastrointestinal system: Abdomen is nondistended, soft and nontender. No organomegaly or masses felt. Normal bowel sounds heard. Central nervous system: Alert and oriented. No focal neurological deficits. Extremities: 2+ LE edema. No calf tenderness Skin: No cyanosis. No rashes Psychiatry: Judgement and insight appear normal. Mood & affect appropriate.     Data Reviewed: I have personally reviewed following labs and imaging studies  CBC: Recent Labs  Lab 04/23/19 1535  04/24/19 0302 04/25/19 0403 04/26/19 0522 04/27/19 0422 04/28/19 0043  WBC 5.8  --  7.0 5.6 3.7* 4.5 4.9  NEUTROABS 4.5  --   --   --  3.3  --   --   HGB 13.8   < > 12.9* 13.6 12.1* 11.8* 11.8*  HCT 39.3   < > 37.2* 39.2 34.9* 34.2* 33.5*  MCV 110.7*  --  109.4* 111.4* 111.1* 110.0* 107.7*  PLT 250  --  274 271 189 181 203   < > = values in this interval not displayed.   Basic Metabolic Panel: Recent Labs  Lab 04/24/19 0302 04/24/19 0924 04/25/19 0403 04/26/19 0522 04/27/19 0422 04/28/19 0043  NA 137  --  137 136 133* 135  K 3.7  --  4.2 4.2 3.6 4.0  CL 94*  --  95* 94* 93* 96*  CO2 32  --  _0 GLUCOSE 135*  --  126* 99 116* 115*  BUN 37*  --  38* 53* 63* 62*  CREATININE 1.13  --  1.50* 2.26* 2.47* 2.33*  CALCIUM 8.4*  --  8.8* 8.7* 8.4* 8.5*  MG  --  1.9 2.0 2.1 2.1 2.1  PHOS  --   --   --  5.4* 4.3 3.5   GFR: Estimated Creatinine Clearance: 33.9 mL/min (A) (by C-G formula based on SCr of 2.33 mg/dL (H)). Liver Function Tests: Recent Labs  Lab 04/24/19 0302 04/25/19 0403 04/26/19 0522 04/26/19 1331 04/27/19 0422 04/28/19 0043  AST _1 --  18 20  ALT 48* 37 34  --  31 29  ALKPHOS 110 98 92  --  81 79  BILITOT 0.6 0.6 0.9  --  0.9 1.0  PROT 5.3* 5.5* 5.3* 5.3* 5.2* 5.1*  ALBUMIN 2.0* 1.9* 1.9*  --  1.8* 1.7*   No results for input(s): LIPASE, AMYLASE in  the last 168 hours. No results for input(s): AMMONIA in the last 168 hours. Coagulation Profile: Recent Labs  Lab 04/23/19 1535  INR 1.0   Cardiac Enzymes: Recent Labs  Lab 04/23/19 1535  TROPONINI 0.25*   BNP (last 3 results) No results for input(s): PROBNP in the last 8760 hours. HbA1C: No results for input(s): HGBA1C in the last 72 hours. CBG: Recent Labs  Lab 04/26/19 1338 04/27/19 0732 04/27/19 1135 04/27/19 1614 04/27/19 1919  GLUCAP 148* 131* 164* 109* 125*   Lipid Profile: Recent Labs    04/26/19 1331  CHOL 206*   Thyroid Function Tests: No results for input(s): TSH, T4TOTAL, FREET4, T3FREE, THYROIDAB in the last 72 hours. Anemia Panel: No results for input(s): VITAMINB12, FOLATE, FERRITIN, TIBC, IRON, RETICCTPCT in the last 72 hours. Sepsis Labs: Recent Labs  Lab 04/23/19 1537 04/23/19 1837 04/24/19 0923  LATICACIDVEN 2.1* 4.3* 1.1    Recent Results (from the past 240 hour(s))  Culture, blood (routine x 2)     Status: None   Collection Time: 04/23/19  3:41 PM   Specimen: BLOOD  Result Value Ref Range Status   Specimen Description BLOOD SITE NOT SPECIFIED  Final   Special Requests   Final    BOTTLES DRAWN AEROBIC ONLY Blood Culture results may not be optimal due to an inadequate volume of blood received in culture bottles   Culture   Final    NO GROWTH 5 DAYS Performed at [facility], [place] [address] [place], [place] [address]    Report Status 04/28/2019 FINAL  Final  Culture, blood (routine x 2)     Status: None   Collection Time: 04/23/19  4:34 PM   Specimen: BLOOD  Result Value Ref Range Status   Specimen Description BLOOD RIGHT ANTECUBITAL  Final   Special Requests   Final    BOTTLES DRAWN AEROBIC AND ANAEROBIC Blood Culture adequate volume   Culture   Final    NO GROWTH 5 DAYS Performed at [facility], [place] [address] [place], [place] [address]    Report Status 04/28/2019 FINAL  Final  SARS Coronavirus 2 (CEPHEID-  Performed in [place] hospital lab), Hosp Order     Status: None   Collection Time: 04/23/19  5:06 PM   Specimen: Nasopharyngeal Swab  Result Value Ref Range Status   SARS Coronavirus 2 NEGATIVE NEGATIVE Final    Comment: (NOTE) If result is NEGATIVE SARS-CoV-2 target nucleic acids are NOT DETECTED. The SARS-CoV-2 RNA is generally detectable in upper and lower  respiratory specimens during the acute phase of infection. The lowest  concentration of SARS-CoV-2 viral copies this assay can detect is 250  copies / mL. A negative result does not preclude SARS-CoV-2 infection  and should not be used as the sole basis for treatment or other  patient management decisions.  A negative result may occur with  improper specimen collection / handling, submission of specimen other  than nasopharyngeal swab, presence of viral mutation(s) within the  areas targeted by this assay, and inadequate number of viral copies  (<250 copies / mL). A negative result must be combined with clinical  observations, patient history, and epidemiological information. If result is POSITIVE SARS-CoV-2 target nucleic acids are DETECTED. The SARS-CoV-2 RNA is generally detectable in upper and lower  respiratory specimens dur ing the acute phase of infection.  Positive  results are indicative of active infection with SARS-CoV-2.  Clinical  correlation with patient history and other diagnostic information is  necessary to determine patient infection status.  Positive results do  not rule out bacterial infection or co-infection with other viruses. If result is PRESUMPTIVE POSTIVE SARS-CoV-2 nucleic acids MAY BE PRESENT.   A presumptive positive result was obtained on the submitted specimen  and confirmed on repeat testing.  While 2019 novel coronavirus  (SARS-CoV-2) nucleic acids may be present in the submitted sample  additional confirmatory testing may be necessary for epidemiological  and / or clinical management  purposes  to differentiate between  SARS-CoV-2 and other Sarbecovirus currently known to infect humans.  If clinically indicated additional testing with an alternate test  methodology [phone]) is advised. The SARS-CoV-2 RNA is generally  detectable in upper and lower respiratory sp ecimens during the acute  phase of infection. The expected result is Negative. Fact Sheet for Patients:  StrictlyIdeas.no Fact Sheet for Healthcare Providers: BankingDealers.co.za This test is not yet approved or cleared by the [place]  FDA and has been authorized for detection and/or diagnosis of SARS-CoV-2 by FDA under an Emergency Use Authorization (EUA).  This EUA will remain in effect (meaning this test can be used) for the duration of the COVID-19 declaration under Section 564(b)(1) of the Act, 21 U.S.C. section 360bbb-3(b)(1), unless the authorization is terminated or revoked sooner. Performed at [facility], [place] [address] [place], [place] [address]   MRSA PCR Screening     Status: None   Collection Time: 04/23/19 11:00 PM   Specimen: Nasopharyngeal  Result Value Ref Range Status   MRSA by PCR NEGATIVE NEGATIVE Final    Comment:        The GeneXpert MRSA Assay (FDA approved for NASAL specimens only), is one component of a comprehensive MRSA colonization surveillance program. It is not intended to diagnose MRSA infection nor to guide or monitor treatment for MRSA infections. Performed at [facility], [place] [address] [place], [place] [address]   Body fluid culture     Status: None (Preliminary result)   Collection Time: 04/26/19  1:10 PM   Specimen: Pleura; Body Fluid  Result Value Ref Range Status   Specimen Description PLEURAL  Final   Special Requests FLUID  Final   Gram Stain   Final    RARE WBC PRESENT,BOTH PMN AND MONONUCLEAR NO ORGANISMS SEEN    Culture   Final    NO GROWTH 2 DAYS Performed at [facility], [address], [place], [place] [address]    Report Status PENDING  Incomplete         Radiology Studies: Dg Chest Port 1 View  Result Date: 04/27/2019 CLINICAL DATA:  Status post thoracentesis EXAM: PORTABLE CHEST 1 VIEW COMPARISON:  04/27/2019 FINDINGS: Unchanged size of small pleural effusions with associated basilar atelectasis. No pneumothorax. Mild pulmonary edema, unchanged. Normal cardiomediastinal contours. IMPRESSION: Unchanged small pleural effusions.  No pneumothorax. Electronically Signed   By: Ulyses Jarred M.D.   On: 04/27/2019 14:18   Dg Chest Port 1 View  Result Date: 04/27/2019 CLINICAL DATA:  Respiratory failure EXAM: PORTABLE CHEST 1 VIEW COMPARISON:  04/26/2019 FINDINGS: Cardiac shadow is stable. Left subclavian central venous line is again noted and stable. Bilateral pleural effusions and bibasilar infiltrates are again noted and stable. No pneumothorax is seen. Mild pulmonary edema is noted as well. IMPRESSION: Stable pulmonary edema with small bilateral pleural effusions. No pneumothorax is noted. Electronically Signed   By: Inez Catalina M.D.   On: 04/27/2019 08:04   Dg Chest Port 1 View  Result Date: 04/26/2019 CLINICAL DATA:  Status post right thoracentesis. EXAM: PORTABLE CHEST 1 VIEW COMPARISON:  Chest x-ray from same day at 5:15 a.m. FINDINGS: Unchanged left subclavian central venous catheter. Stable cardiomediastinal silhouette. Unchanged pulmonary edema. Interval decrease in size of the now small right pleural effusion. Unchanged moderate left pleural effusion. Persistent bibasilar atelectasis, slightly improved on the right. No pneumothorax. IMPRESSION: 1. Decreased now small right pleural effusion status post thoracentesis. No pneumothorax. 2. Unchanged pulmonary edema and moderate left pleural effusion. 3. Bibasilar atelectasis, mildly improved on the right. Electronically Signed   By: Titus Dubin M.D.   On: 04/26/2019 15:00        Scheduled  Meds: . feeding supplement (ENSURE ENLIVE)  237 mL Oral BID BM  . mouth rinse  15 mL Mouth Rinse BID  . midodrine  5 mg Oral TID WC   Continuous Infusions: . sodium chloride Stopped (04/25/19 0936)  .  heparin 1,700 Units/hr (04/28/19 1145)     LOS: 5 days     Cordelia Poche, MD [facility] 04/28/2019, 1:41 PM  If 7PM-7AM, please contact night-coverage [email or web address]

## 2019-04-29 ENCOUNTER — Inpatient Hospital Stay (HOSPITAL_COMMUNITY): Payer: Managed Care, Other (non HMO)

## 2019-04-29 LAB — BASIC METABOLIC PANEL
Anion gap: 11 (ref 5–15)
BUN: 49 mg/dL — ABNORMAL HIGH (ref 6–20)
CO2: 28 mmol/L (ref 22–32)
Calcium: 8.5 mg/dL — ABNORMAL LOW (ref 8.9–10.3)
Chloride: 94 mmol/L — ABNORMAL LOW (ref 98–111)
Creatinine, Ser: 1.95 mg/dL — ABNORMAL HIGH (ref 0.61–1.24)
GFR calc Af Amer: 42 mL/min — ABNORMAL LOW (ref >60)
GFR calc non Af Amer: 36 mL/min — ABNORMAL LOW (ref >60)
Glucose, Bld: 100 mg/dL — ABNORMAL HIGH (ref 70–99)
Potassium: 4 mmol/L (ref 3.5–5.1)
Sodium: 133 mmol/L — ABNORMAL LOW (ref 135–145)

## 2019-04-29 LAB — CBC
HCT: 35.1 % — ABNORMAL LOW (ref 39.0–52.0)
Hemoglobin: 12.1 g/dL — ABNORMAL LOW (ref 13.0–17.0)
MCH: 37.3 pg — ABNORMAL HIGH (ref 26.0–34.0)
MCHC: 34.5 g/dL (ref 30.0–36.0)
MCV: 108.3 fL — ABNORMAL HIGH (ref 80.0–100.0)
Platelets: 216 10*3/uL (ref 150–400)
RBC: 3.24 MIL/uL — ABNORMAL LOW (ref 4.22–5.81)
RDW: 14.6 % (ref 11.5–15.5)
WBC: 4.4 10*3/uL (ref 4.0–10.5)
nRBC: 0 % (ref 0.0–0.2)

## 2019-04-29 LAB — BODY FLUID CULTURE: Culture: NO GROWTH

## 2019-04-29 LAB — HEPARIN LEVEL (UNFRACTIONATED)
Heparin Unfractionated: 0.32 IU/mL (ref 0.30–0.70)
Heparin Unfractionated: 0.28 IU/mL — ABNORMAL LOW (ref 0.30–0.70)
Heparin Unfractionated: 0.51 IU/mL (ref 0.30–0.70)

## 2019-04-29 LAB — ADENOSIDE DEAMINASE, PLEURAL FL: ADENOSIDE DEAMINASE, PLEURAL FL: 2 U/L (ref 0.0–9.4)

## 2019-04-29 MED ORDER — TORSEMIDE 20 MG PO TABS
20.0000 mg | ORAL_TABLET | Freq: Once | ORAL | Status: AC
  Administered 2019-04-29: 20 mg via ORAL
  Filled 2019-04-29: qty 1

## 2019-04-29 NOTE — Progress Notes (Signed)
ANTICOAGULATION CONSULT NOTE   Pharmacy Consult for Heparin Indication: pulmonary embolus  No Known Allergies  Patient Measurements: Height: 6\' 2"  (188 cm) Weight: 156 lb 1.6 oz (70.8 kg) IBW/kg (Calculated) : 82.2  Heparin dosing weight: 70.6kg  Vital Signs: Temp: 97.8 F (36.6 C) (06/14 1354) Temp Source: Oral (06/14 1354) BP: 95/71 (06/14 1354) Pulse Rate: 87 (06/14 1354)  Labs: Recent Labs    04/27/19 0422  04/28/19 0043  04/28/19 2135 04/29/19 0702 04/29/19 0750 04/29/19 1429  HGB 11.8*  --  11.8*  --   --  12.1*  --   --   HCT 34.2*  --  33.5*  --   --  35.1*  --   --   PLT 181  --  203  --   --  216  --   --   HEPARINUNFRC  --    < > 0.12*   < > 0.22* 0.32  --  0.28*  CREATININE 2.47*  --  2.33*  --   --   --  1.95*  --    < > = values in this interval not displayed.    Estimated Creatinine Clearance: 40.3 mL/min (A) (by C-G formula based on SCr of 1.95 mg/dL (H)).   Medical History: History reviewed. No pertinent past medical history.  Assessment: Erik Costa is a 58yo male admitted with pulmonary embolism. Pharmacy consulted to start heparin infusion. Chest CT: Acute pulmonary embolus involving RUL and RLL. Clot burden is small to moderate. Positive for acute PE with CT evidence of right heart strain. RV/LV Ratio = 1.1. Reviewed outpatient notes from [facility] and patient not on prior anticoagulation. Patient s/p thoracentesis 6/12.  Repeat heparin level slightly subtherapeutic this afternoon at 0.28 (goal 0.3 - 0.7). No bleeding issues noted.     Goal of Therapy:  Heparin level 0.3-0.7 units/ml Monitor platelets by anticoagulation protocol: Yes   Plan:  Increase heparin to 2000 units/hr Check heparin level in 6 hours Check daily heparin level and CBC  Monitor for s/sx of bleeding F/u plans for oral anticoagulation   Thank you for allowing pharmacy to be a part of this patient's care.  Leron Croak, PharmD PGY1 Pharmacy  Resident Phone: [phone]  Please check AMION for all [facility] Pharmacy phone numbers 04/29/2019

## 2019-04-29 NOTE — Progress Notes (Signed)
Progress Note  Patient Name: Erik Costa Date of Encounter: 04/29/2019  Primary Cardiologist: Clabe Seal  Subjective   Some SOB this AM  Inpatient Medications    Scheduled Meds: . feeding supplement (ENSURE ENLIVE)  237 mL Oral BID BM  . mouth rinse  15 mL Mouth Rinse BID  . midodrine  5 mg Oral TID WC   Continuous Infusions: . sodium chloride Stopped (04/25/19 0936)  . heparin 1,900 Units/hr (04/29/19 0411)   PRN Meds:    Vital Signs    Vitals:   04/28/19 1143 04/28/19 1834 04/28/19 2000 04/29/19 0500  BP: (!) 73/60 (!) 89/71 91/66   Pulse: 83 81 83   Resp:      Temp: (!) 97.4 F (36.3 C) 97.9 F (36.6 C) 98 F (36.7 C)   TempSrc: Oral Oral Oral Other (Comment)  SpO2: 95% 95% 97%   Weight:      Height:        Intake/Output Summary (Last 24 hours) at 04/29/2019 1010 Last data filed at 04/29/2019 0825 Gross per 24 hour  Intake 293.6 ml  Output 850 ml  Net -556.4 ml   Last 3 Weights 04/29/2019 04/28/2019 04/27/2019  Weight (lbs) (No Data) 156 lb 12.7 oz 156 lb 12.8 oz  Weight (kg) (No Data) 71.12 kg 71.124 kg      Telemetry    NSR - Personally Reviewed  ECG    n/a  Physical Exam   GEN: No acute distress.   Neck: mildly elevated JVD Cardiac: RRR, no murmurs, rubs, or gallops.  Respiratory: decreased breath sounds mid to lower lung bases bilaterally GI: Soft, nontender, non-distended  MS: 1-2 + bilateral LE edema, No deformity. Neuro:  Nonfocal  Psych: Normal affect   Labs    Chemistry Recent Labs  Lab 04/26/19 0522 04/26/19 1331 04/27/19 0422 04/28/19 0043 04/29/19 0750  NA 136  --  133* 135 133*  K 4.2  --  3.6 4.0 4.0  CL 94*  --  93* 96* 94*  CO2 29  --  30 29 28   GLUCOSE 99  --  116* 115* 100*  BUN 53*  --  63* 62* 49*  CREATININE 2.26*  --  2.47* 2.33* 1.95*  CALCIUM 8.7*  --  8.4* 8.5* 8.5*  PROT 5.3* 5.3* 5.2* 5.1*  --   ALBUMIN 1.9*  --  1.8* 1.7*  --   AST 24  --  18 20  --   ALT 34  --  31 29  --   ALKPHOS 92   --  81 79  --   BILITOT 0.9  --  0.9 1.0  --   GFRNONAA 30*  --  27* 29* 36*  GFRAA 35*  --  32* 34* 42*  ANIONGAP 13  --  10 10 11      Hematology Recent Labs  Lab 04/27/19 0422 04/28/19 0043 04/29/19 0702  WBC 4.5 4.9 4.4  RBC 3.11* 3.11* 3.24*  HGB 11.8* 11.8* 12.1*  HCT 34.2* 33.5* 35.1*  MCV 110.0* 107.7* 108.3*  MCH 37.9* 37.9* 37.3*  MCHC 34.5 35.2 34.5  RDW 15.3 14.7 14.6  PLT 181 203 216    Cardiac Enzymes Recent Labs  Lab 04/23/19 1535  TROPONINI 0.25*   No results for input(s): TROPIPOC in the last 168 hours.   BNP Recent Labs  Lab 04/23/19 1537 04/25/19 0404  BNP 2,502.3* 2,888.1*     DDimer No results for input(s): DDIMER in the last 168 hours.  Radiology    Dg Chest Port 1 View  Result Date: 04/27/2019 CLINICAL DATA:  Status post thoracentesis EXAM: PORTABLE CHEST 1 VIEW COMPARISON:  04/27/2019 FINDINGS: Unchanged size of small pleural effusions with associated basilar atelectasis. No pneumothorax. Mild pulmonary edema, unchanged. Normal cardiomediastinal contours. IMPRESSION: Unchanged small pleural effusions.  No pneumothorax. Electronically Signed   By: Ulyses Jarred M.D.   On: 04/27/2019 14:18    Cardiac Studies    Patient Profile    Erik Costa a 58 y.o.malewith a hx ofmultiple myeloma with ALamyloidosis with heart, kidney and positive fat pad involvement (dx in 2019)who is being seen today for the evaluation ofshock in the setting of acute PEat the request ofPCCM.  Assessment & Plan    1. Acute hypoxic respiratory failure - likely multifactorial related to PE, HF, and bilateral pleural effusions - 04/2019 echo LVEF 45-50%, cannot eval diastolic function, moderate RV dysfunction. Severe LVH - avoiding aggressive diuresis given RV dysfunction, hypotension, and renal dysfunction  - significant improvement in Cr over last few days, diuretics have been on hold - weights stable yesterday, pending today - remains volume  overloaded, will try low dose toresemide 17m today to see how tolerated.   2. Pleural effusions - s/p thoracentesis right side 6/11, left side 6/12 - monitoring, if recurrent may need to consider Pleurex tubes - renal function and bp's will not tolerate aggressive diuresis, suspect will need pleurex placement but will defer to CHF team when rounds resume Monday  - we will repeat CXR today  3. 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 Bilateral PE - on hep gtt, can convert to oral anticoag once final decision is made on pleurex tubes   5. Hypotension - off pressors, has been on midodrine - soft bp's at times, MAPs remain at goal  6. VT - occurred in setting of hypoxia, respiratory distress - had been on amiodarone, now off  7. Multiple myeloma with AL amyloidosis with heart, kidney and positive fat pad involvement (dx in 2019) - Treated by hematology/oncology withcyclophosphamide,Velcade, dexamethasone. Treatments on hold    For questions or updates, please contact [facility] consult [email or web address] for contact info under        Signed, BrCarlyle DollyMD  04/29/2019, 10:10 AM

## 2019-04-29 NOTE — Progress Notes (Signed)
PROGRESS NOTE    Erik Costa  QAS:[id number] DOB: 04/24/1961 DOA: 04/23/2019 PCP: Azzie Roup, MD   Brief Narrative: Erik Costa is a 58 y.o. male with a history of heart failure, amyloidosis, recurrent pleural effusions, multiple myeloma. He presented secondary to sudden collapse and found to have a pulmonary embolism. Course complicated by respiratory and circulatory failure requiring intubation and vasopressors. Cardiology consulted for heart failure.   Assessment & Plan:   Active Problems:   Pulmonary embolus (HCC)   Protein-calorie malnutrition, severe   Pulmonary embolism No history. Associated right heart strain on CT. Started on heparin drip -Continue heparin drip with plans to eventually switch to Coumadin. Will need bridge, and hopefully can bridge with Lovenox  Acute on chronic diastolic heart failure Transthoracic Echocardiogram with an EF of 45-50%. Previously diuresed with Lasix IV which has been stopped secondary to worsening AKI in addition to worsening hypotension. Cardiology/heart failure team on board -Cardiology recommendations: restart diuresis  Acute respiratory failure with hypoxia Pulmonary edema Intubated from 6/8 to 6/10. Patient is s/p bilateral thoracentesis. Difficult to manage with diuresis secondary to hypotension. -Wean to room air -Plan for possible Pleurx  Hypotension Chronic and worsened. Was previously on vasopressors but weaned off. Goal MAP of >60 per cardiology. Midodrine initiated. Complicating diuresis attempts. Improved this morning.  Amyloidosis Multiple myeloma Cardiac and renal involvement. On cyclophosphamide and Velcade therapy. Poor prognosis. Follows with [facility].  Ventricular tachycardia Per chart review, secondary to hypoxia and respiratory distress. Was treated with amiodarone which has now been discontinued.  DVT prophylaxis: Heparin drip Code Status:   Code Status: Full Code Family Communication: None  Disposition Plan: Discharge pending cardiology recommendations   Consultants:   PCCM  Cardiology  Procedures:   6/8-6/10: ETT  6/9: Transthoracic Echocardiogram IMPRESSIONS    1. The left ventricle has mildly reduced systolic function, with an ejection fraction of 45-50%. The cavity size was normal. There is severe concentric left ventricular hypertrophy. Left ventricular diastolic function could not be evaluated due to  indeterminate diastolic function. Elevated left ventricular end-diastolic pressure No evidence of left ventricular regional wall motion abnormalities.  2. The right ventricle has moderately reduced systolic function. The cavity was normal. There is no increase in right ventricular wall thickness. Right ventricular systolic pressure could not be assessed.  3. Right atriu is severely dilated. There is significant spontaneous echo contrast in the RA with no obvious thrombus.  4. Large pleural effusion in the left lateral region.  5. The pericardial effusion is posterior to the left ventricle.  6. Trivial pericardial effusion is present.  7. The inferior vena cava was dilated in size with <50% respiratory variability.  8. Given severe LVH, increased filling pressures and speckeled appearance of myocardium in addition to trace pericardial effusion, consider cardiac MRI to rulw out amyloidosis.  Antimicrobials:  None    Subjective: Some shortness of breath. No chest pain.  Objective: Vitals:   04/28/19 1834 04/28/19 2000 04/29/19 0500 04/29/19 1200  BP: (!) 89/71 91/66    Pulse: 81 83    Resp:      Temp: 97.9 F (36.6 C) 98 F (36.7 C)    TempSrc: Oral Oral Other (Comment)   SpO2: 95% 97%    Weight:    70.8 kg  Height:        Intake/Output Summary (Last 24 hours) at 04/29/2019 1307 Last data filed at 04/29/2019 0825 Gross per 24 hour  Intake 293.6 ml  Output 600 ml  Net -306.4  ml   Filed Weights   04/27/19 2051 04/28/19 0457 04/29/19 1200   Weight: 71.1 kg 71.1 kg 70.8 kg    Examination:  General exam: Appears calm and comfortable Respiratory system: Rales bilaterally. Respiratory effort normal. Cardiovascular system: S1 & S2 heard, RRR. No murmurs, rubs, gallops or clicks. Gastrointestinal system: Abdomen is nondistended, soft and nontender. No organomegaly or masses felt. Normal bowel sounds heard. Central nervous system: Alert and oriented. No focal neurological deficits. Extremities: 2+ LE edema. No calf tenderness Skin: No cyanosis. No rashes Psychiatry: Judgement and insight appear normal. Mood & affect appropriate.     Data Reviewed: I have personally reviewed following labs and imaging studies  CBC: Recent Labs  Lab 04/23/19 1535  04/25/19 0403 04/26/19 0522 04/27/19 0422 04/28/19 0043 04/29/19 0702  WBC 5.8   < > 5.6 3.7* 4.5 4.9 4.4  NEUTROABS 4.5  --   --  3.3  --   --   --   HGB 13.8   < > 13.6 12.1* 11.8* 11.8* 12.1*  HCT 39.3   < > 39.2 34.9* 34.2* 33.5* 35.1*  MCV 110.7*   < > 111.4* 111.1* 110.0* 107.7* 108.3*  PLT 250   < > 271 189 181 203 216   < > = values in this interval not displayed.   Basic Metabolic Panel: Recent Labs  Lab 04/24/19 0924 04/25/19 0403 04/26/19 0522 04/27/19 0422 04/28/19 0043 04/29/19 0750  NA  --  137 136 133* 135 133*  K  --  4.2 4.2 3.6 4.0 4.0  CL  --  95* 94* 93* 96* 94*  CO2  --  31 29 30 29 28   GLUCOSE  --  126* 99 116* 115* 100*  BUN  --  38* 53* 63* 62* 49*  CREATININE  --  1.50* 2.26* 2.47* 2.33* 1.95*  CALCIUM  --  8.8* 8.7* 8.4* 8.5* 8.5*  MG 1.9 2.0 2.1 2.1 2.1  --   PHOS  --   --  5.4* 4.3 3.5  --    GFR: Estimated Creatinine Clearance: 40.3 mL/min (A) (by C-G formula based on SCr of 1.95 mg/dL (H)). Liver Function Tests: Recent Labs  Lab 04/24/19 0302 04/25/19 0403 04/26/19 0522 04/26/19 1331 04/27/19 0422 04/28/19 0043  AST 31 22 24   --  18 20  ALT 48* 37 34  --  31 29  ALKPHOS 110 98 92  --  81 79  BILITOT 0.6 0.6 0.9  --  0.9  1.0  PROT 5.3* 5.5* 5.3* 5.3* 5.2* 5.1*  ALBUMIN 2.0* 1.9* 1.9*  --  1.8* 1.7*   No results for input(s): LIPASE, AMYLASE in the last 168 hours. No results for input(s): AMMONIA in the last 168 hours. Coagulation Profile: Recent Labs  Lab 04/23/19 1535  INR 1.0   Cardiac Enzymes: Recent Labs  Lab 04/23/19 1535  TROPONINI 0.25*   BNP (last 3 results) No results for input(s): PROBNP in the last 8760 hours. HbA1C: No results for input(s): HGBA1C in the last 72 hours. CBG: Recent Labs  Lab 04/26/19 1338 04/27/19 0732 04/27/19 1135 04/27/19 1614 04/27/19 1919  GLUCAP 148* 131* 164* 109* 125*   Lipid Profile: Recent Labs    04/26/19 1331  CHOL 206*   Thyroid Function Tests: No results for input(s): TSH, T4TOTAL, FREET4, T3FREE, THYROIDAB in the last 72 hours. Anemia Panel: No results for input(s): VITAMINB12, FOLATE, FERRITIN, TIBC, IRON, RETICCTPCT in the last 72 hours. Sepsis Labs: Recent Labs  Lab  04/23/19 1537 04/23/19 1837 04/24/19 0923  LATICACIDVEN 2.1* 4.3* 1.1    Recent Results (from the past 240 hour(s))  Culture, blood (routine x 2)     Status: None   Collection Time: 04/23/19  3:41 PM   Specimen: BLOOD  Result Value Ref Range Status   Specimen Description BLOOD SITE NOT SPECIFIED  Final   Special Requests   Final    BOTTLES DRAWN AEROBIC ONLY Blood Culture results may not be optimal due to an inadequate volume of blood received in culture bottles   Culture   Final    NO GROWTH 5 DAYS Performed at [facility], [place] [address], [place], [place] [address]    Report Status 04/28/2019 FINAL  Final  Culture, blood (routine x 2)     Status: None   Collection Time: 04/23/19  4:34 PM   Specimen: BLOOD  Result Value Ref Range Status   Specimen Description BLOOD RIGHT ANTECUBITAL  Final   Special Requests   Final    BOTTLES DRAWN AEROBIC AND ANAEROBIC Blood Culture adequate volume   Culture   Final    NO GROWTH 5 DAYS Performed at [facility], [place] [address], [place], [place] [address]    Report Status 04/28/2019 FINAL  Final  SARS Coronavirus 2 (CEPHEID- Performed in [place] hospital lab), Hosp Order     Status: None   Collection Time: 04/23/19  5:06 PM   Specimen: Nasopharyngeal Swab  Result Value Ref Range Status   SARS Coronavirus 2 NEGATIVE NEGATIVE Final    Comment: (NOTE) If result is NEGATIVE SARS-CoV-2 target nucleic acids are NOT DETECTED. The SARS-CoV-2 RNA is generally detectable in upper and lower  respiratory specimens during the acute phase of infection. The lowest  concentration of SARS-CoV-2 viral copies this assay can detect is 250  copies / mL. A negative result does not preclude SARS-CoV-2 infection  and should not be used as the sole basis for treatment or other  patient management decisions.  A negative result may occur with  improper specimen collection / handling, submission of specimen other  than nasopharyngeal swab, presence of viral mutation(s) within the  areas targeted by this assay, and inadequate number of viral copies  (<250 copies / mL). A negative result must be combined with clinical  observations, patient history, and epidemiological information. If result is POSITIVE SARS-CoV-2 target nucleic acids are DETECTED. The SARS-CoV-2 RNA is generally detectable in upper and lower  respiratory specimens dur ing the acute phase of infection.  Positive  results are indicative of active infection with SARS-CoV-2.  Clinical  correlation with patient history and other diagnostic information is  necessary to determine patient infection status.  Positive results do  not rule out bacterial infection or co-infection with other viruses. If result is PRESUMPTIVE POSTIVE SARS-CoV-2 nucleic acids MAY BE PRESENT.   A presumptive positive result was obtained on the submitted specimen  and confirmed on repeat testing.  While 2019 novel coronavirus  (SARS-CoV-2) nucleic acids may be present in  the submitted sample  additional confirmatory testing may be necessary for epidemiological  and / or clinical management purposes  to differentiate between  SARS-CoV-2 and other Sarbecovirus currently known to infect humans.  If clinically indicated additional testing with an alternate test  methodology [id number]) is advised. The SARS-CoV-2 RNA is generally  detectable in upper and lower respiratory sp ecimens during the acute  phase of infection. The expected result is Negative. Fact Sheet  for Patients:  StrictlyIdeas.no Fact Sheet for Healthcare Providers: BankingDealers.co.za This test is not yet approved or cleared by the [place] FDA and has been authorized for detection and/or diagnosis of SARS-CoV-2 by FDA under an Emergency Use Authorization (EUA).  This EUA will remain in effect (meaning this test can be used) for the duration of the COVID-19 declaration under Section 564(b)(1) of the Act, 21 U.S.C. section 360bbb-3(b)(1), unless the authorization is terminated or revoked sooner. Performed at [facility], [place] [address], [place], [place] [address]   MRSA PCR Screening     Status: None   Collection Time: 04/23/19 11:00 PM   Specimen: Nasopharyngeal  Result Value Ref Range Status   MRSA by PCR NEGATIVE NEGATIVE Final    Comment:        The GeneXpert MRSA Assay (FDA approved for NASAL specimens only), is one component of a comprehensive MRSA colonization surveillance program. It is not intended to diagnose MRSA infection nor to guide or monitor treatment for MRSA infections. Performed at [facility], [place] [address] [place], [place] [address]   Body fluid culture     Status: None   Collection Time: 04/26/19  1:10 PM   Specimen: Pleura; Body Fluid  Result Value Ref Range Status   Specimen Description PLEURAL  Final   Special Requests FLUID  Final   Gram Stain   Final    RARE WBC PRESENT,BOTH PMN AND  MONONUCLEAR NO ORGANISMS SEEN    Culture   Final    NO GROWTH Performed at [facility], [address] [place], [place] [address]    Report Status 04/29/2019 FINAL  Final         Radiology Studies: Dg Chest Port 1 View  Result Date: 04/27/2019 CLINICAL DATA:  Status post thoracentesis EXAM: PORTABLE CHEST 1 VIEW COMPARISON:  04/27/2019 FINDINGS: Unchanged size of small pleural effusions with associated basilar atelectasis. No pneumothorax. Mild pulmonary edema, unchanged. Normal cardiomediastinal contours. IMPRESSION: Unchanged small pleural effusions.  No pneumothorax. Electronically Signed   By: Ulyses Jarred M.D.   On: 04/27/2019 14:18        Scheduled Meds: . feeding supplement (ENSURE ENLIVE)  237 mL Oral BID BM  . mouth rinse  15 mL Mouth Rinse BID  . midodrine  5 mg Oral TID WC   Continuous Infusions: . sodium chloride Stopped (04/25/19 0936)  . heparin 1,900 Units/hr (04/29/19 0411)     LOS: 6 days     Cordelia Poche, MD [facility] 04/29/2019, 1:07 PM  If 7PM-7AM, please contact night-coverage [email or web address]

## 2019-04-29 NOTE — Progress Notes (Signed)
[place] for Heparin Indication: pulmonary embolus  No Known Allergies  Patient Measurements: Height: 6\' 2"  (188 cm) Weight: (patient refused) IBW/kg (Calculated) : 82.2  Heparin dosing weight: 70.6kg  Vital Signs: Temp Source: Other (Comment) (06/14 0500)  Labs: Recent Labs    04/27/19 0422  04/28/19 0043 04/28/19 1122 04/28/19 2135 04/29/19 0702 04/29/19 0750  HGB 11.8*  --  11.8*  --   --  12.1*  --   HCT 34.2*  --  33.5*  --   --  35.1*  --   PLT 181  --  203  --   --  216  --   HEPARINUNFRC  --    < > 0.12* 0.32 0.22* 0.32  --   CREATININE 2.47*  --  2.33*  --   --   --  1.95*   < > = values in this interval not displayed.    Estimated Creatinine Clearance: 40.5 mL/min (A) (by C-G formula based on SCr of 1.95 mg/dL (H)).   Medical History: History reviewed. No pertinent past medical history.  Assessment: Erik Costa is a 58yo male admitted with pulmonary embolism. Pharmacy consulted to start heparin infusion. Chest CT: Acute pulmonary embolus involving RUL and RLL. Clot burden is small to moderate. Positive for acute PE with CT evidence of right heart strain. RV/LV Ratio = 1.1. Reviewed outpatient notes from [facility] and patient not on prior anticoagulation. Patient s/p thoracentesis 6/12.  Heparin level on the lower end of therapeutic this AM. CBC stable, and pltc wnl. No bleeding or issues with infusion noted. Per RN pt has scattered ecchymosis, MD is aware and will see pt this AM.   Goal of Therapy:  Heparin level 0.3-0.7 units/ml Monitor platelets by anticoagulation protocol: Yes   Plan:  Continue heparin at 1900 units/hr Check confirmatory heparin level in 6hr Check daily heparin level and CBC  Monitor for s/sx of bleeding  Thank you for allowing pharmacy to be a part of this patient's care.  Leron Croak, PharmD PGY1 Pharmacy Resident Phone: [phone]  Please check AMION for all [facility]  Pharmacy phone numbers 04/29/2019

## 2019-04-29 NOTE — Progress Notes (Signed)
ANTICOAGULATION CONSULT NOTE   Pharmacy Consult for Heparin Indication: pulmonary embolus  No Known Allergies  Patient Measurements: Height: 6\' 2"  (188 cm) Weight: 156 lb 1.6 oz (70.8 kg) IBW/kg (Calculated) : 82.2  Heparin dosing weight: 70.6kg  Vital Signs: Temp: 97.5 F (36.4 C) (06/14 2026) Temp Source: Oral (06/14 2026) BP: 84/68 (06/14 2026) Pulse Rate: 74 (06/14 2026)  Labs: Recent Labs    04/27/19 0422  04/28/19 0043  04/29/19 0702 04/29/19 0750 04/29/19 1429 04/29/19 2133  HGB 11.8*  --  11.8*  --  12.1*  --   --   --   HCT 34.2*  --  33.5*  --  35.1*  --   --   --   PLT 181  --  203  --  216  --   --   --   HEPARINUNFRC  --    < > 0.12*   < > 0.32  --  0.28* 0.51  CREATININE 2.47*  --  2.33*  --   --  1.95*  --   --    < > = values in this interval not displayed.    Estimated Creatinine Clearance: 40.3 mL/min (A) (by C-G formula based on SCr of 1.95 mg/dL (H)).   Medical History: History reviewed. No pertinent past medical history.  Assessment: Erik Costa is a 58yo male admitted with pulmonary embolism. Pharmacy consulted to start heparin infusion. Chest CT: Acute pulmonary embolus involving RUL and RLL. Clot burden is small to moderate. Positive for acute PE with CT evidence of right heart strain. RV/LV Ratio = 1.1. Reviewed outpatient notes from [facility] and patient not on prior anticoagulation. Patient s/p thoracentesis 6/12.  Repeat heparin level now therapeutic, no changes needed.  Goal of Therapy:  Heparin level 0.3-0.7 units/ml Monitor platelets by anticoagulation protocol: Yes   Plan:  Continue heparin 2000 units/hr Check daily heparin level and CBC  Monitor for s/sx of bleeding F/u plans for oral anticoagulation    Arrie Senate, PharmD, BCPS Clinical Pharmacist [phone] Please check AMION for all [facility] numbers 04/29/2019

## 2019-04-30 ENCOUNTER — Other Ambulatory Visit: Payer: Self-pay

## 2019-04-30 DIAGNOSIS — J90 Pleural effusion, not elsewhere classified: Secondary | ICD-10-CM

## 2019-04-30 DIAGNOSIS — I959 Hypotension, unspecified: Secondary | ICD-10-CM

## 2019-04-30 LAB — COMPREHENSIVE METABOLIC PANEL
ALT: 31 U/L (ref 0–44)
AST: 22 U/L (ref 15–41)
Albumin: 1.8 g/dL — ABNORMAL LOW (ref 3.5–5.0)
Alkaline Phosphatase: 93 U/L (ref 38–126)
Anion gap: 10 (ref 5–15)
BUN: 45 mg/dL — ABNORMAL HIGH (ref 6–20)
CO2: 29 mmol/L (ref 22–32)
Calcium: 8.9 mg/dL (ref 8.9–10.3)
Chloride: 97 mmol/L — ABNORMAL LOW (ref 98–111)
Creatinine, Ser: 1.83 mg/dL — ABNORMAL HIGH (ref 0.61–1.24)
GFR calc Af Amer: 45 mL/min — ABNORMAL LOW (ref >60)
GFR calc non Af Amer: 39 mL/min — ABNORMAL LOW (ref >60)
Glucose, Bld: 121 mg/dL — ABNORMAL HIGH (ref 70–99)
Potassium: 4.2 mmol/L (ref 3.5–5.1)
Sodium: 136 mmol/L (ref 135–145)
Total Bilirubin: 1 mg/dL (ref 0.3–1.2)
Total Protein: 5.5 g/dL — ABNORMAL LOW (ref 6.5–8.1)

## 2019-04-30 LAB — CBC
HCT: 36.7 % — ABNORMAL LOW (ref 39.0–52.0)
HCT: 36.8 % — ABNORMAL LOW (ref 39.0–52.0)
Hemoglobin: 12.7 g/dL — ABNORMAL LOW (ref 13.0–17.0)
Hemoglobin: 12.9 g/dL — ABNORMAL LOW (ref 13.0–17.0)
MCH: 37.6 pg — ABNORMAL HIGH (ref 26.0–34.0)
MCH: 37.9 pg — ABNORMAL HIGH (ref 26.0–34.0)
MCHC: 34.6 g/dL (ref 30.0–36.0)
MCHC: 35.1 g/dL (ref 30.0–36.0)
MCV: 108.6 fL — ABNORMAL HIGH (ref 80.0–100.0)
MCV: 108.2 fL — ABNORMAL HIGH (ref 80.0–100.0)
Platelets: 230 10*3/uL (ref 150–400)
Platelets: 234 10*3/uL (ref 150–400)
RBC: 3.38 MIL/uL — ABNORMAL LOW (ref 4.22–5.81)
RBC: 3.4 MIL/uL — ABNORMAL LOW (ref 4.22–5.81)
RDW: 14.5 % (ref 11.5–15.5)
RDW: 14.4 % (ref 11.5–15.5)
WBC: 3.9 10*3/uL — ABNORMAL LOW (ref 4.0–10.5)
WBC: 3.3 10*3/uL — ABNORMAL LOW (ref 4.0–10.5)
nRBC: 0 % (ref 0.0–0.2)
nRBC: 0 % (ref 0.0–0.2)

## 2019-04-30 LAB — BASIC METABOLIC PANEL
Anion gap: 11 (ref 5–15)
BUN: 47 mg/dL — ABNORMAL HIGH (ref 6–20)
CO2: 27 mmol/L (ref 22–32)
Calcium: 8.6 mg/dL — ABNORMAL LOW (ref 8.9–10.3)
Chloride: 95 mmol/L — ABNORMAL LOW (ref 98–111)
Creatinine, Ser: 1.78 mg/dL — ABNORMAL HIGH (ref 0.61–1.24)
GFR calc Af Amer: 47 mL/min — ABNORMAL LOW (ref >60)
GFR calc non Af Amer: 41 mL/min — ABNORMAL LOW (ref >60)
Glucose, Bld: 125 mg/dL — ABNORMAL HIGH (ref 70–99)
Potassium: 3.7 mmol/L (ref 3.5–5.1)
Sodium: 133 mmol/L — ABNORMAL LOW (ref 135–145)

## 2019-04-30 LAB — HEPARIN LEVEL (UNFRACTIONATED): Heparin Unfractionated: 0.74 IU/mL — ABNORMAL HIGH (ref 0.30–0.70)

## 2019-04-30 LAB — SURGICAL PCR SCREEN
MRSA, PCR: NEGATIVE
Staphylococcus aureus: NEGATIVE

## 2019-04-30 LAB — PROTIME-INR
INR: 1.1 (ref 0.8–1.2)
Prothrombin Time: 14 seconds (ref 11.4–15.2)

## 2019-04-30 LAB — APTT: aPTT: 104 seconds — ABNORMAL HIGH (ref 24–36)

## 2019-04-30 MED ORDER — NON FORMULARY: 6.0000 mg | Freq: Every day | Status: DC

## 2019-04-30 MED ORDER — TORSEMIDE 20 MG PO TABS
40.0000 mg | ORAL_TABLET | Freq: Every day | ORAL | Status: DC
  Administered 2019-04-30 – 2019-05-03 (×4): 40 mg via ORAL
  Filled 2019-04-30 (×4): qty 2

## 2019-04-30 MED ORDER — MELATONIN 3 MG PO TABS
6.0000 mg | ORAL_TABLET | Freq: Every day | ORAL | Status: DC
  Administered 2019-04-30 – 2019-05-02 (×3): 6 mg via ORAL
  Filled 2019-04-30 (×5): qty 2

## 2019-04-30 MED ORDER — CEFAZOLIN SODIUM-DEXTROSE 2-4 GM/100ML-% IV SOLN
2.0000 g | INTRAVENOUS | Status: AC
  Administered 2019-05-01: 2 g via INTRAVENOUS
  Filled 2019-04-30: qty 100

## 2019-04-30 NOTE — Progress Notes (Signed)
ANTICOAGULATION CONSULT NOTE   Pharmacy Consult for Heparin Indication: pulmonary embolus  No Known Allergies  Patient Measurements: Height: 6\' 2"  (188 cm) Weight: 156 lb 1.6 oz (70.8 kg) IBW/kg (Calculated) : 82.2  Heparin dosing weight: 70.6kg  Vital Signs: BP: 98/74 (06/15 0607) Pulse Rate: 94 (06/15 0607)  Labs: Recent Labs    04/28/19 0043  04/29/19 0702 04/29/19 0750 04/29/19 1429 04/29/19 2133 04/30/19 0909  HGB 11.8*  --  12.1*  --   --   --  12.7*  HCT 33.5*  --  35.1*  --   --   --  36.7*  PLT 203  --  216  --   --   --  230  HEPARINUNFRC 0.12*   < > 0.32  --  0.28* 0.51 0.74*  CREATININE 2.33*  --   --  1.95*  --   --  1.78*   < > = values in this interval not displayed.    Estimated Creatinine Clearance: 44.2 mL/min (A) (by C-G formula based on SCr of 1.78 mg/dL (H)).   Medical History: History reviewed. No pertinent past medical history.  Assessment: Erik Costa is a 58yo male admitted with pulmonary embolism. Pharmacy consulted to start heparin infusion. Chest CT: Acute pulmonary embolus involving RUL and RLL. Clot burden is small to moderate. Positive for acute PE with CT evidence of right heart strain. RV/LV Ratio = 1.1. Reviewed outpatient notes from [facility] and patient not on prior anticoagulation. Patient s/p thoracentesis 6/12 - and new accumulation so probable pleurx this week.    Heparin level 0.74 upper end of therapeutic on heparin drip 2000uts/hr.  CBC stable.    Goal of Therapy:  Heparin level 0.3-0.7 units/ml Monitor platelets by anticoagulation protocol: Yes   Plan:  Decrease  heparin 1900 units/hr Check daily heparin level and CBC  Monitor for s/sx of bleeding F/u plans for oral anticoagulation    Bonnita Nasuti Pharm.D. CPP, BCPS Clinical Pharmacist [phone] 04/30/2019 11:12 AM

## 2019-04-30 NOTE — Progress Notes (Signed)
PROGRESS NOTE    Erik Costa  FIE:[id number] DOB: 1960/11/23 DOA: 04/23/2019 PCP: Azzie Roup, MD   Brief Narrative: Erik Costa is a 58 y.o. male with a history of heart failure, amyloidosis, recurrent pleural effusions, multiple myeloma. He presented secondary to sudden collapse and found to have a pulmonary embolism. Course complicated by respiratory and circulatory failure requiring intubation and vasopressors. Cardiology consulted for heart failure.   Assessment & Plan:   Active Problems:   Pulmonary embolus (HCC)   Protein-calorie malnutrition, severe  Pulmonary embolism No history. Associated right heart strain on CT. Started on heparin drip -Continue heparin drip with plans to eventually switch to Coumadin. Will need bridge, and hopefully can bridge with Lovenox  Acute on chronic diastolic heart failure Transthoracic Echocardiogram with an EF of 45-50%. Previously diuresed with Lasix IV which has been stopped secondary to worsening AKI in addition to worsening hypotension. Restarted on torsemide Cardiology/heart failure team on board -Cardiology recommendations: torsemide -Elevate legs  Acute respiratory failure with hypoxia Pulmonary edema Intubated from 6/8 to 6/10. Patient is s/p bilateral thoracentesis. Difficult to manage with diuresis secondary to hypotension. CT surgery eval pending -Wean to room air -Plan for possible Pleurx  Hypotension Chronic and worsened. Was previously on vasopressors but weaned off. Goal MAP of >60 per cardiology. Midodrine initiated. Complicating diuresis attempts. Improved this morning.  Amyloidosis Multiple myeloma Cardiac and renal involvement. On cyclophosphamide and Velcade therapy. Poor prognosis. Follows with [facility].  Ventricular tachycardia Per chart review, secondary to hypoxia and respiratory distress. Was treated with amiodarone which has now been discontinued.  DVT prophylaxis: Heparin drip Code  Status:   Code Status: Full Code Family Communication: None Disposition Plan: Discharge pending cardiology recommendations   Consultants:   PCCM  Cardiology  Procedures:   6/8-6/10: ETT  6/9: Transthoracic Echocardiogram IMPRESSIONS    1. The left ventricle has mildly reduced systolic function, with an ejection fraction of 45-50%. The cavity size was normal. There is severe concentric left ventricular hypertrophy. Left ventricular diastolic function could not be evaluated due to  indeterminate diastolic function. Elevated left ventricular end-diastolic pressure No evidence of left ventricular regional wall motion abnormalities.  2. The right ventricle has moderately reduced systolic function. The cavity was normal. There is no increase in right ventricular wall thickness. Right ventricular systolic pressure could not be assessed.  3. Right atriu is severely dilated. There is significant spontaneous echo contrast in the RA with no obvious thrombus.  4. Large pleural effusion in the left lateral region.  5. The pericardial effusion is posterior to the left ventricle.  6. Trivial pericardial effusion is present.  7. The inferior vena cava was dilated in size with <50% respiratory variability.  8. Given severe LVH, increased filling pressures and speckeled appearance of myocardium in addition to trace pericardial effusion, consider cardiac MRI to rulw out amyloidosis.  Antimicrobials:  None    Subjective: Didn't sleep too well. No other issues.  Objective: Vitals:   04/29/19 1643 04/29/19 2026 04/29/19 2202 04/30/19 0607  BP: (!) 89/66 (!) 84/68  98/74  Pulse: 75 74  94  Resp:  14  14  Temp: (!) 97.4 F (36.3 C) (!) 97.5 F (36.4 C)    TempSrc: Oral Oral    SpO2: 95% 96% 95% 90%  Weight:      Height:        Intake/Output Summary (Last 24 hours) at 04/30/2019 1120 Last data filed at 04/30/2019 0124 Gross per 24 hour  Intake 362.24  ml  Output 500 ml  Net -137.76 ml    Filed Weights   04/27/19 2051 04/28/19 0457 04/29/19 1200  Weight: 71.1 kg 71.1 kg 70.8 kg    Examination:  General exam: Appears calm and comfortable Respiratory system: diminished bilaterally. Respiratory effort normal. Cardiovascular system: S1 & S2 heard, RRR. No murmurs, rubs, gallops or clicks. Gastrointestinal system: Abdomen is nondistended, soft and nontender. No organomegaly or masses felt. Normal bowel sounds heard. Central nervous system: Alert and oriented. No focal neurological deficits. Extremities: 2+ LE edema. No calf tenderness Skin: No cyanosis. Ecchymosis present Psychiatry: Judgement and insight appear normal. Mood & affect appropriate.     Data Reviewed: I have personally reviewed following labs and imaging studies  CBC: Recent Labs  Lab 04/23/19 1535  04/26/19 0522 04/27/19 0422 04/28/19 0043 04/29/19 0702 04/30/19 0909  WBC 5.8   < > 3.7* 4.5 4.9 4.4 3.9*  NEUTROABS 4.5  --  3.3  --   --   --   --   HGB 13.8   < > 12.1* 11.8* 11.8* 12.1* 12.7*  HCT 39.3   < > 34.9* 34.2* 33.5* 35.1* 36.7*  MCV 110.7*   < > 111.1* 110.0* 107.7* 108.3* 108.6*  PLT 250   < > 189 181 203 216 230   < > = values in this interval not displayed.   Basic Metabolic Panel: Recent Labs  Lab 04/24/19 0924 04/25/19 0403 04/26/19 0522 04/27/19 0422 04/28/19 0043 04/29/19 0750 04/30/19 0909  NA  --  137 136 133* 135 133* 133*  K  --  4.2 4.2 3.6 4.0 4.0 3.7  CL  --  95* 94* 93* 96* 94* 95*  CO2  --  31 29 30 29 28 27   GLUCOSE  --  126* 99 116* 115* 100* 125*  BUN  --  38* 53* 63* 62* 49* 47*  CREATININE  --  1.50* 2.26* 2.47* 2.33* 1.95* 1.78*  CALCIUM  --  8.8* 8.7* 8.4* 8.5* 8.5* 8.6*  MG 1.9 2.0 2.1 2.1 2.1  --   --   PHOS  --   --  5.4* 4.3 3.5  --   --    GFR: Estimated Creatinine Clearance: 44.2 mL/min (A) (by C-G formula based on SCr of 1.78 mg/dL (H)). Liver Function Tests: Recent Labs  Lab 04/24/19 0302 04/25/19 0403 04/26/19 0522 04/26/19 1331  04/27/19 0422 04/28/19 0043  AST 31 22 24   --  18 20  ALT 48* 37 34  --  31 29  ALKPHOS 110 98 92  --  81 79  BILITOT 0.6 0.6 0.9  --  0.9 1.0  PROT 5.3* 5.5* 5.3* 5.3* 5.2* 5.1*  ALBUMIN 2.0* 1.9* 1.9*  --  1.8* 1.7*   No results for input(s): LIPASE, AMYLASE in the last 168 hours. No results for input(s): AMMONIA in the last 168 hours. Coagulation Profile: Recent Labs  Lab 04/23/19 1535  INR 1.0   Cardiac Enzymes: Recent Labs  Lab 04/23/19 1535  TROPONINI 0.25*   BNP (last 3 results) No results for input(s): PROBNP in the last 8760 hours. HbA1C: No results for input(s): HGBA1C in the last 72 hours. CBG: Recent Labs  Lab 04/26/19 1338 04/27/19 0732 04/27/19 1135 04/27/19 1614 04/27/19 1919  GLUCAP 148* 131* 164* 109* 125*   Lipid Profile: No results for input(s): CHOL, HDL, LDLCALC, TRIG, CHOLHDL, LDLDIRECT in the last 72 hours. Thyroid Function Tests: No results for input(s): TSH, T4TOTAL, FREET4, T3FREE, THYROIDAB in  the last 72 hours. Anemia Panel: No results for input(s): VITAMINB12, FOLATE, FERRITIN, TIBC, IRON, RETICCTPCT in the last 72 hours. Sepsis Labs: Recent Labs  Lab 04/23/19 1537 04/23/19 1837 04/24/19 0923  LATICACIDVEN 2.1* 4.3* 1.1    Recent Results (from the past 240 hour(s))  Culture, blood (routine x 2)     Status: None   Collection Time: 04/23/19  3:41 PM   Specimen: BLOOD  Result Value Ref Range Status   Specimen Description BLOOD SITE NOT SPECIFIED  Final   Special Requests   Final    BOTTLES DRAWN AEROBIC ONLY Blood Culture results may not be optimal due to an inadequate volume of blood received in culture bottles   Culture   Final    NO GROWTH 5 DAYS Performed at [facility], [place] [address] [place], [place] [address]    Report Status 04/28/2019 FINAL  Final  Culture, blood (routine x 2)     Status: None   Collection Time: 04/23/19  4:34 PM   Specimen: BLOOD  Result Value Ref Range Status   Specimen Description  BLOOD RIGHT ANTECUBITAL  Final   Special Requests   Final    BOTTLES DRAWN AEROBIC AND ANAEROBIC Blood Culture adequate volume   Culture   Final    NO GROWTH 5 DAYS Performed at [facility], [place] [address], [place], [place] [address]    Report Status 04/28/2019 FINAL  Final  SARS Coronavirus 2 (CEPHEID- Performed in [place] hospital lab), Hosp Order     Status: None   Collection Time: 04/23/19  5:06 PM   Specimen: Nasopharyngeal Swab  Result Value Ref Range Status   SARS Coronavirus 2 NEGATIVE NEGATIVE Final    Comment: (NOTE) If result is NEGATIVE SARS-CoV-2 target nucleic acids are NOT DETECTED. The SARS-CoV-2 RNA is generally detectable in upper and lower  respiratory specimens during the acute phase of infection. The lowest  concentration of SARS-CoV-2 viral copies this assay can detect is 250  copies / mL. A negative result does not preclude SARS-CoV-2 infection  and should not be used as the sole basis for treatment or other  patient management decisions.  A negative result may occur with  improper specimen collection / handling, submission of specimen other  than nasopharyngeal swab, presence of viral mutation(s) within the  areas targeted by this assay, and inadequate number of viral copies  (<250 copies / mL). A negative result must be combined with clinical  observations, patient history, and epidemiological information. If result is POSITIVE SARS-CoV-2 target nucleic acids are DETECTED. The SARS-CoV-2 RNA is generally detectable in upper and lower  respiratory specimens dur ing the acute phase of infection.  Positive  results are indicative of active infection with SARS-CoV-2.  Clinical  correlation with patient history and other diagnostic information is  necessary to determine patient infection status.  Positive results do  not rule out bacterial infection or co-infection with other viruses. If result is PRESUMPTIVE POSTIVE SARS-CoV-2 nucleic acids MAY BE  PRESENT.   A presumptive positive result was obtained on the submitted specimen  and confirmed on repeat testing.  While 2019 novel coronavirus  (SARS-CoV-2) nucleic acids may be present in the submitted sample  additional confirmatory testing may be necessary for epidemiological  and / or clinical management purposes  to differentiate between  SARS-CoV-2 and other Sarbecovirus currently known to infect humans.  If clinically indicated additional testing with an alternate test  methodology [phone]) is advised. The  SARS-CoV-2 RNA is generally  detectable in upper and lower respiratory sp ecimens during the acute  phase of infection. The expected result is Negative. Fact Sheet for Patients:  StrictlyIdeas.no Fact Sheet for Healthcare Providers: BankingDealers.co.za This test is not yet approved or cleared by the [place] FDA and has been authorized for detection and/or diagnosis of SARS-CoV-2 by FDA under an Emergency Use Authorization (EUA).  This EUA will remain in effect (meaning this test can be used) for the duration of the COVID-19 declaration under Section 564(b)(1) of the Act, 21 U.S.C. section 360bbb-3(b)(1), unless the authorization is terminated or revoked sooner. Performed at [facility], [place] [address] [place], [place] [address]   MRSA PCR Screening     Status: None   Collection Time: 04/23/19 11:00 PM   Specimen: Nasopharyngeal  Result Value Ref Range Status   MRSA by PCR NEGATIVE NEGATIVE Final    Comment:        The GeneXpert MRSA Assay (FDA approved for NASAL specimens only), is one component of a comprehensive MRSA colonization surveillance program. It is not intended to diagnose MRSA infection nor to guide or monitor treatment for MRSA infections. Performed at [facility], [place] [address], [place], [place] [address]   Body fluid culture     Status: None   Collection Time: 04/26/19  1:10 PM    Specimen: Pleura; Body Fluid  Result Value Ref Range Status   Specimen Description PLEURAL  Final   Special Requests FLUID  Final   Gram Stain   Final    RARE WBC PRESENT,BOTH PMN AND MONONUCLEAR NO ORGANISMS SEEN    Culture   Final    NO GROWTH Performed at [facility], [address], [place], [place] [address]    Report Status 04/29/2019 FINAL  Final         Radiology Studies: Dg Chest Port 1 View  Result Date: 04/29/2019 CLINICAL DATA:  Shortness of breath. EXAM: PORTABLE CHEST 1 VIEW COMPARISON:  April 27, 2019 FINDINGS: Bilateral pleural effusions with underlying opacities are more prominent the interval. No other interval changes. IMPRESSION: Increasing bilateral pleural effusions with underlying opacities. Electronically Signed   By: Dorise Bullion III M.D   On: 04/29/2019 13:56        Scheduled Meds: . feeding supplement (ENSURE ENLIVE)  237 mL Oral BID BM  . mouth rinse  15 mL Mouth Rinse BID  . midodrine  5 mg Oral TID WC   Continuous Infusions: . sodium chloride Stopped (04/25/19 0936)  . heparin 2,000 Units/hr (04/30/19 0803)     LOS: 7 days     Cordelia Poche, MD [facility] 04/30/2019, 11:20 AM  If 7PM-7AM, please contact night-coverage [email or web address]

## 2019-04-30 NOTE — Progress Notes (Signed)
Placed consult to wound care as area on patient's sacrum that started out as two serous blisters has worsened. Sacral area is raw, painful, and malodorous with purulent drainage. Area is covered with foam dressing and patient is utilizing a pressure reducing cushion from home.

## 2019-04-30 NOTE — Consult Note (Signed)
[address]       [place] [address]             [phone]        Stanton Gharibian [place] Medical Record #[id number] Date of Birth: 1961/06/11  Referring: Dr Haroldine Laws Primary Care: Azzie Roup, MD Primary Cardiologist:No primary care provider on file.  Chief Complaint:    Chief Complaint  Patient presents with  . Respiratory Arrest    History of Present Illness:     Patient is a 58 year old male with known amyloid followed at [facility] who was admitted to [facility] last week aftersuffering a respiratory arrest, and pulmonary embolus on his way home from cardiology appointment in [place].  CT scan showed large bilateral pleural effusions.  Patient notes that that each side had previously been tapped at least 2 occasions and while in the hospital here each side is been tapped once with approximately 1-1/2 L of fluid.  Asked to see the patient in regards to pleural drainage with the Pleurx catheter is bilateral today after he ate breakfast.  Chest x-ray yesterday shows significant bilateral pleural effusions  Patient currently is on heparin for PE.  Current Activity/ Functional Status: Patient is independent with mobility/ambulation, transfers, ADL's, IADL's.   Zubrod Score: At the time of surgery this patient's most appropriate activity status/level should be described as: []     0    Normal activity, no symptoms []     1    Restricted in physical strenuous activity but ambulatory, able to do out light work [x]     2    Ambulatory and capable of self care, unable to do work activities, up and about                 more than 50%  Of the time                            []     3    Only limited self care, in bed greater than 50% of waking hours []     4    Completely disabled, no self care, confined to bed or chair []     5    Moribund  History reviewed. No pertinent past medical history. -Patient has extensive past medical history, he  has been entered in the [facility] epic system under duplicate number and 1 none able to get full records from [facility].  He has known chronic renal disease and cardiac disease related to amyloid.  He has been getting chemotherapy at [facility] once a week, just recently migrated to every other week. History reviewed. No pertinent surgical history.  Social History   Tobacco Use  Smoking Status Not on file    Social History   Substance and Sexual Activity  Alcohol Use None     No Known Allergies  Current Facility-Administered Medications  Medication Dose Route Frequency Provider Last Rate Last Dose  . 0.9 %  sodium chloride infusion   Intravenous Continuous Minor, Grace Bushy, NP   Stopped at 04/25/19 [phone]  . feeding supplement (ENSURE ENLIVE) (ENSURE ENLIVE) liquid 237 mL  237 mL Oral BID BM Minor, Grace Bushy, NP   237 mL at 04/27/19 0954  . heparin ADULT infusion 100 units/mL (25000 units/285mL sodium chloride 0.45%)  2,000 Units/hr Intravenous Continuous Mariel Aloe, MD 20 mL/hr at 04/30/19 0803 2,000 Units/hr at 04/30/19 0803  . MEDLINE mouth rinse  15 mL Mouth Rinse BID Minor, Grace Bushy, NP   15 mL at 04/30/19 1239  . midodrine (PROAMATINE) tablet 5 mg  5 mg Oral TID WC Minor, Grace Bushy, NP   5 mg at 04/30/19 1219    Medications Prior to Admission  Medication Sig Dispense Refill Last Dose  . acetaminophen (TYLENOL) 325 MG tablet Take 325 mg by mouth every 8 (eight) hours as needed for mild pain or headache.   unk at [employer]  . acyclovir (ZOVIRAX) 400 MG tablet Take 400 mg by mouth 2 (two) times daily as needed (for outbreaks).   unk at [employer]  . albuterol (PROVENTIL HFA) 108 (90 Base) MCG/ACT inhaler Inhale 2 puffs into the lungs every 6 (six) hours as needed for wheezing or shortness of breath.   unk at [employer]  . aspirin EC 81 MG tablet Take 81 mg by mouth daily.   04/23/2019 at 0800  . bortezomib IV (VELCADE) 3.5 MG injection Inject 1.3 mg/m2 into the vein every 14 (fourteen) days.   04/13/2019 at am   . Cholecalciferol (VITAMIN D-3) 25 MCG (1000 UT) CAPS Take 1,000 Units by mouth daily.   04/23/2019 at Unknown time  . cyclophosphamide (CYTOXAN) 50 MG capsule Take 600 mg by mouth every 14 (fourteen) days. Take with food to minimize GI upset. Take early in the day and maintain hydration.   04/13/2019 at am  . dexamethasone (DECADRON) 4 MG tablet Take 10 mg by mouth every Friday.   04/20/2019 at am  . fexofenadine (ALLEGRA) 180 MG tablet Take 180 mg by mouth daily.   04/23/2019 at Unknown time  . magnesium oxide (MAG-OX) 400 MG tablet Take 800 mg by mouth 2 (two) times daily.   04/23/2019 at Unknown time  . megestrol (MEGACE) 20 MG tablet Take 20 mg by mouth 4 (four) times daily.   04/23/2019 at am  . Melatonin 3 MG TABS Take 3 mg by mouth at bedtime.   04/22/2019 at pm  . Multiple Vitamins-Minerals (ONE-A-DAY MENS 50+ ADVANTAGE) TABS Take 1 tablet by mouth daily with breakfast.    04/23/2019 at Unknown time  . Omega-3 1000 MG CAPS Take 1,000 mg by mouth daily with breakfast.   04/23/2019 at am  . ondansetron (ZOFRAN) 4 MG tablet Take 4 mg by mouth every 6 (six) hours as needed for nausea.   unk at [employer]  . Polyvinyl Alcohol-Povidone (CLEAR EYES NATURAL TEARS) 5-6 MG/ML SOLN Place 1-2 drops into both eyes as needed (for dryness).   Past Week at Unknown time  . potassium chloride SA (K-DUR) 20 MEQ tablet Take 20-40 mEq by mouth See admin instructions. Take 20 mEq by mouth in the morning on Sun/Tues/Thurs/Sat and 40 mEq by mouth on Mon/Wed/Fri   04/23/2019 at am  . torsemide (DEMADEX) 100 MG tablet Take 100 mg by mouth daily. Take 100 mg by mouth once a day on Sun/Tues/Thurs/Sat and 100 mg two times a day on Mon/Wed/Fri   04/23/2019 at am  . Zinc Sulfate 220 (50 Zn) MG TABS Take 220 mg by mouth daily.   04/23/2019 at am    No family history on file.   Review of Systems:   ROS Pertinent items are noted in HPI.     Cardiac Review of Systems: Y or  [    ]= no  Chest Pain [    ]  Resting SOB [  y ] Exertional SOB  [  y]   Orthopnea Blue.Reese  ]  Pedal Edema y[   ]    Palpitations [ y ] Syncope  [ y ]   Presyncope Blue.Reese   ]  General Review of Systems: [Y] = yes [  ]=no Constitional: recent weight change [  ]; anorexia Blue.Reese  ]; fatigue Blue.Reese  ]; nausea [  ]; night sweats [  ]; fever [  ]; or chills [  ]                                                               Dental: Last Dentist visit:   Eye : blurred vision [  ]; diplopia [   ]; vision changes [  ];  Amaurosis fugax[  ]; Resp: cough [  ];  wheezing[  ];  hemoptysis[  ]; shortness of breath[  ]; paroxysmal nocturnal dyspnea[  ]; dyspnea on exertion[  ]; or orthopnea[  ];  GI:  gallstones[  ], vomiting[  ];  dysphagia[  ]; melena[  ];  hematochezia [  ]; heartburn[  ];   Hx of  Colonoscopy[  ]; GU: kidney stones [  ]; hematuria[  ];   dysuria [  ];  nocturia[  ];  history of     obstruction [  ]; urinary frequency [  ]             Skin: rash, swelling[  ];, hair loss[  ];  peripheral edema[  ];  or itching[  ]; Musculosketetal: myalgias[  ];  joint swelling[  ];  joint erythema[  ];  joint pain[  ];  back pain[  ];  Heme/Lymph: bruising[  ];  bleeding[  ];  anemia[  ];  Neuro: TIA[  ];  headaches[  ];  stroke[  ];  vertigo[  ];  seizures[  ];   paresthesias[  ];  difficulty walking[  ];  Psych:depression[  ]; anxiety[  ];  Endocrine: diabetes[  ];  thyroid dysfunction[  ];               Physical Exam: BP 98/74 (BP Location: Left Arm)   Pulse 94   Temp (!) 97.5 F (36.4 C) (Oral)   Resp 14   Ht 6\' 2"  (1.88 m)   Wt 70.8 kg   SpO2 90%   BMI 20.04 kg/m    General appearance: alert, cooperative and appears older than stated age Head: Normocephalic, without obvious abnormality, atraumatic Neck: no adenopathy, no carotid bruit, no JVD, supple, symmetrical, trachea midline and thyroid not enlarged, symmetric, no tenderness/mass/nodules Lymph nodes: Cervical, supraclavicular, and axillary nodes normal. Resp: diminished breath sounds bilaterally Back: symmetric, no  curvature. ROM normal. No CVA tenderness. Cardio: regular rate and rhythm, S1, S2 normal, no murmur, click, rub or gallop GI: soft, non-tender; bowel sounds normal; no masses,  no organomegaly Extremities: extremities normal, atraumatic, no cyanosis or edema and Homans sign is negative, no sign of DVT Neurologic: Grossly normal  Diagnostic Studies & Laboratory data:     Recent Radiology Findings:   Dg Chest Port 1 View  Result Date: 04/29/2019 CLINICAL DATA:  Shortness of breath. EXAM: PORTABLE CHEST 1 VIEW COMPARISON:  April 27, 2019 FINDINGS: Bilateral pleural effusions with underlying opacities are more prominent the interval. No other interval changes. IMPRESSION: Increasing bilateral pleural  effusions with underlying opacities. Electronically Signed   By: Dorise Bullion III M.D   On: 04/29/2019 13:56     I have independently reviewed the above radiologic studies and discussed with the patient   Recent Lab Findings: Lab Results  Component Value Date   WBC 3.9 (L) 04/30/2019   HGB 12.7 (L) 04/30/2019   HCT 36.7 (L) 04/30/2019   PLT 230 04/30/2019   GLUCOSE 125 (H) 04/30/2019   CHOL 206 (H) 04/26/2019   ALT 29 04/28/2019   AST 20 04/28/2019   NA 133 (L) 04/30/2019   K 3.7 04/30/2019   CL 95 (L) 04/30/2019   CREATININE 1.78 (H) 04/30/2019   BUN 47 (H) 04/30/2019   CO2 27 04/30/2019   INR 1.0 04/23/2019    Chronic Kidney Disease   Stage I     GFR >90  Stage II    GFR 60-89  Stage IIIA GFR 45-59  Stage IIIB GFR 30-44  Stage IV   GFR 15-29  Stage V    GFR  <15  Lab Results  Component Value Date   CREATININE 1.78 (H) 04/30/2019   Estimated Creatinine Clearance: 44.2 mL/min (A) (by C-G formula based on SCr of 1.78 mg/dL (H)).   Assessment / Plan:   Large bilateral pleural effusions, recurrent each side is been tapped at least 3 times Amyloidosis Stage III chronic kidney disease   I discussed with the patient proceeding with placement of bilateral Pleurx catheters  risks and options were discussed with him.  Unfortunately he ate breakfast this morning so were unable to schedule an OR time today but will arrange for tomorrow.      Grace Isaac MD      [place] [place] [address] Office [phone]   Beeper [phone]  04/30/2019 1:47 PM

## 2019-04-30 NOTE — Progress Notes (Signed)
Progress Note  Patient Name: Erik Costa Date of Encounter: 04/30/2019  Primary Cardiologist: Clabe Seal  Subjective   Still SOB with any activity. Mild orthopnea. No CP. On heparin. No bleeding   CXR with re-accumulating bilateral pleural effusions. Has been seen by TCTS. Plan for bilateral Pleurex tubes in am.   Off diuretics Weight stable at 156. Creatinine stable 1.8 range  Inpatient Medications    Scheduled Meds:  feeding supplement (ENSURE ENLIVE)  237 mL Oral BID BM   mouth rinse  15 mL Mouth Rinse BID   midodrine  5 mg Oral TID WC   Continuous Infusions:  sodium chloride Stopped (04/25/19 0936)   [START ON 05/01/2019]  ceFAZolin (ANCEF) IV     heparin 2,000 Units/hr (04/30/19 0803)   PRN Meds:    Vital Signs    Vitals:   04/29/19 2202 04/30/19 0607 04/30/19 1007 04/30/19 1739  BP:  _0  Pulse:  94 88 89  Resp:  14    Temp:      TempSrc:      SpO2: 95% 90% 95% 98%  Weight:      Height:        Intake/Output Summary (Last 24 hours) at 04/30/2019 1810 Last data filed at 04/30/2019 1030 Gross per 24 hour  Intake 362.24 ml  Output 575 ml  Net -212.76 ml   Last 3 Weights 04/29/2019 04/29/2019 04/28/2019  Weight (lbs) 156 lb 1.6 oz (No Data) 156 lb 12.7 oz  Weight (kg) 70.806 kg (No Data) 71.12 kg      Telemetry    Sinus rhythm 80s. Personally reviewed   ECG    na - Personally Reviewed  Physical Exam   General:  Sitting in chair No resp difficulty HEENT: normal multiple ecchymosis Neck: supple. JVP 9-10. Carotids 2+ bilat; no bruits. No lymphadenopathy or thryomegaly appreciated. Cor: PMI nondisplaced. Regular rate & rhythm. 2/6 TR Lungs: clear dull 1/3 up bilaterally  Abdomen: soft, nontender, nondistended. No hepatosplenomegaly. No bruits or masses. Good bowel sounds. Extremities: no cyanosis, clubbing, rash, tr edema Neuro: alert & orientedx3, cranial nerves grossly intact. moves all 4 extremities w/o difficulty.  Affect pleasant   Labs    Chemistry Recent Labs  Lab 04/27/19 0422 04/28/19 0043 04/29/19 0750 04/30/19 0909 04/30/19 1458  NA 133* 135 133* 133* 136  K 3.6 4.0 4.0 3.7 4.2  CL 93* 96* 94* 95* 97*  CO2 _1 GLUCOSE 116* 115* 100* 125* 121*  BUN 63* 62* 49* 47* 45*  CREATININE 2.47* 2.33* 1.95* 1.78* 1.83*  CALCIUM 8.4* 8.5* 8.5* 8.6* 8.9  PROT 5.2* 5.1*  --   --  5.5*  ALBUMIN 1.8* 1.7*  --   --  1.8*  AST 18 20  --   --  22  ALT 31 29  --   --  31  ALKPHOS 81 79  --   --  93  BILITOT 0.9 1.0  --   --  1.0  GFRNONAA 27* 29* 36* 41* 39*  GFRAA 32* 34* 42* 47* 45*  ANIONGAP _2 Hematology Recent Labs  Lab 04/29/19 0702 04/30/19 0909 04/30/19 1458  WBC 4.4 3.9* 3.3*  RBC 3.24* 3.38* 3.40*  HGB 12.1* 12.7* 12.9*  HCT 35.1* 36.7* 36.8*  MCV 108.3* 108.6* 108.2*  MCH 37.3* 37.6* 37.9*  MCHC 34.5 34.6 35.1  RDW 14.6 14.5 14.4  PLT 216 230 234  Cardiac Enzymes No results for input(s): TROPONINI in the last 168 hours. No results for input(s): TROPIPOC in the last 168 hours.   BNP Recent Labs  Lab 04/25/19 0404  BNP 2,888.1*     DDimer No results for input(s): DDIMER in the last 168 hours.   Radiology    Dg Chest Port 1 View  Result Date: 04/29/2019 CLINICAL DATA:  Shortness of breath. EXAM: PORTABLE CHEST 1 VIEW COMPARISON:  April 27, 2019 FINDINGS: Bilateral pleural effusions with underlying opacities are more prominent the interval. No other interval changes. IMPRESSION: Increasing bilateral pleural effusions with underlying opacities. Electronically Signed   By: Dorise Bullion III M.D   On: 04/29/2019 13:56    Patient Profile      Erik Costa is a 58 y.o. 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  Assessment & Plan    1. Acute hypoxic  respiratory failure - likely multifactorial related to PE, HF, and bilateral pleural effusions - 04/2019 echo LVEF 45-50%, cannot eval diastolic function, moderate RV dysfunction. Severe LVH - now improved. - weight stable off torsemide for now but edema worse. Will place TED hose and restart torsemide  - for bilateral Pleurex tubes in am (d/w TCTS personally)   2. Pleural effusions, recurrent - s/p thoracentesis right side 6/11, left side 6/12 - fluid re-accumulating - for bilateral Pleurex tubes in am (d/w TCTS personally)  3. 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 Will place TED hose and restart torsemide   4. Bilateral PE - on hep gtt, can convert to DOAC once pleurex tubes  5. Hypotension - off pressors, has been on midodrine. Will increase to 0 tid  6. VT - occurred in setting of hypoxia, respiratory distress - had been on amiodarone, now off  7. Multiple myeloma with AL amyloidosis with heart, kidney and positive fat pad involvement (dx in 2019) - Treated by hematology/oncology with cyclophosphamide,Velcade, dexamethasone     For questions or updates, please contact [place] Please consult [email or web address] for contact info under        Signed, Glori Bickers, MD  04/30/2019, 6:10 PM

## 2019-05-01 ENCOUNTER — Inpatient Hospital Stay (HOSPITAL_COMMUNITY): Payer: Managed Care, Other (non HMO)

## 2019-05-01 ENCOUNTER — Encounter (HOSPITAL_COMMUNITY)
Admission: EM | Disposition: A | Discharge status: Home / Self Care | Payer: Self-pay | Source: Home / Self Care | Attending: Family Medicine

## 2019-05-01 ENCOUNTER — Encounter (HOSPITAL_COMMUNITY): Payer: Self-pay | Admitting: Orthopedic Surgery

## 2019-05-01 ENCOUNTER — Inpatient Hospital Stay (HOSPITAL_COMMUNITY): Payer: Managed Care, Other (non HMO) | Admitting: Registered Nurse

## 2019-05-01 HISTORY — PX: CHEST TUBE INSERTION: SHX231

## 2019-05-01 LAB — CBC
HCT: 37.9 % — ABNORMAL LOW (ref 39.0–52.0)
Hemoglobin: 13 g/dL (ref 13.0–17.0)
MCH: 37.2 pg — ABNORMAL HIGH (ref 26.0–34.0)
MCHC: 34.3 g/dL (ref 30.0–36.0)
MCV: 108.6 fL — ABNORMAL HIGH (ref 80.0–100.0)
Platelets: 232 10*3/uL (ref 150–400)
RBC: 3.49 MIL/uL — ABNORMAL LOW (ref 4.22–5.81)
RDW: 14.2 % (ref 11.5–15.5)
WBC: 3.9 10*3/uL — ABNORMAL LOW (ref 4.0–10.5)
nRBC: 0 % (ref 0.0–0.2)

## 2019-05-01 LAB — BASIC METABOLIC PANEL
Anion gap: 11 (ref 5–15)
BUN: 42 mg/dL — ABNORMAL HIGH (ref 6–20)
CO2: 30 mmol/L (ref 22–32)
Calcium: 8.7 mg/dL — ABNORMAL LOW (ref 8.9–10.3)
Chloride: 93 mmol/L — ABNORMAL LOW (ref 98–111)
Creatinine, Ser: 1.68 mg/dL — ABNORMAL HIGH (ref 0.61–1.24)
GFR calc Af Amer: 50 mL/min — ABNORMAL LOW (ref >60)
GFR calc non Af Amer: 43 mL/min — ABNORMAL LOW (ref >60)
Glucose, Bld: 116 mg/dL — ABNORMAL HIGH (ref 70–99)
Potassium: 3.8 mmol/L (ref 3.5–5.1)
Sodium: 134 mmol/L — ABNORMAL LOW (ref 135–145)

## 2019-05-01 LAB — COOXEMETRY PANEL
Carboxyhemoglobin: 1.2 % (ref 0.5–1.5)
Methemoglobin: 0.7 % (ref 0.0–1.5)
O2 Saturation: 57 %
Total hemoglobin: 12.3 g/dL (ref 12.0–16.0)

## 2019-05-01 LAB — HEPARIN LEVEL (UNFRACTIONATED)
Heparin Unfractionated: 1.02 IU/mL — ABNORMAL HIGH (ref 0.30–0.70)
Heparin Unfractionated: 0.5 IU/mL (ref 0.30–0.70)

## 2019-05-01 SURGERY — INSERTION, PLEURAL DRAINAGE CATHETER
Anesthesia: Monitor Anesthesia Care | Site: Chest | Laterality: Bilateral

## 2019-05-01 MED ORDER — MIDAZOLAM HCL 5 MG/5ML IJ SOLN
INTRAMUSCULAR | Status: DC | PRN
  Administered 2019-05-01: 1 mg via INTRAVENOUS

## 2019-05-01 MED ORDER — OMEGA-3 1000 MG PO CAPS: 1000.0000 mg | ORAL_CAPSULE | Freq: Every day | ORAL | Status: DC

## 2019-05-01 MED ORDER — ALBUMIN HUMAN 5 % IV SOLN
INTRAVENOUS | Status: AC
  Filled 2019-05-01: qty 250

## 2019-05-01 MED ORDER — GERHARDT'S BUTT CREAM
TOPICAL_CREAM | Freq: Two times a day (BID) | CUTANEOUS | Status: DC
  Administered 2019-05-01 – 2019-05-03 (×5): via TOPICAL
  Filled 2019-05-01: qty 1

## 2019-05-01 MED ORDER — POTASSIUM CHLORIDE CRYS ER 10 MEQ PO TBCR
10.0000 meq | EXTENDED_RELEASE_TABLET | Freq: Once | ORAL | Status: AC
  Administered 2019-05-01: 10 meq via ORAL
  Filled 2019-05-01: qty 1

## 2019-05-01 MED ORDER — LIDOCAINE 2% (20 MG/ML) 5 ML SYRINGE
INTRAMUSCULAR | Status: AC
  Filled 2019-05-01: qty 5

## 2019-05-01 MED ORDER — ACETAMINOPHEN 10 MG/ML IV SOLN: 1000.0000 mg | Freq: Once | INTRAVENOUS | Status: DC | PRN

## 2019-05-01 MED ORDER — EPHEDRINE SULFATE-NACL 50-0.9 MG/10ML-% IV SOSY
PREFILLED_SYRINGE | INTRAVENOUS | Status: DC | PRN
  Administered 2019-05-01 (×2): 10 mg via INTRAVENOUS
  Administered 2019-05-01: 5 mg via INTRAVENOUS

## 2019-05-01 MED ORDER — ALBUTEROL SULFATE (2.5 MG/3ML) 0.083% IN NEBU: 2.5000 mg | INHALATION_SOLUTION | Freq: Four times a day (QID) | RESPIRATORY_TRACT | Status: DC | PRN

## 2019-05-01 MED ORDER — MEPERIDINE HCL 25 MG/ML IJ SOLN: 6.2500 mg | INTRAMUSCULAR | Status: DC | PRN

## 2019-05-01 MED ORDER — FENTANYL CITRATE (PF) 100 MCG/2ML IJ SOLN
INTRAMUSCULAR | Status: DC | PRN
  Administered 2019-05-01 (×2): 25 ug via INTRAVENOUS

## 2019-05-01 MED ORDER — VITAMIN D 25 MCG (1000 UNIT) PO TABS
1000.0000 [IU] | ORAL_TABLET | Freq: Every day | ORAL | Status: DC
  Administered 2019-05-02 – 2019-05-03 (×2): 1000 [IU] via ORAL
  Filled 2019-05-01 (×3): qty 1

## 2019-05-01 MED ORDER — ACETAMINOPHEN 160 MG/5ML PO SOLN: 325.0000 mg | Freq: Once | ORAL | Status: DC | PRN

## 2019-05-01 MED ORDER — ACYCLOVIR 400 MG PO TABS
400.0000 mg | ORAL_TABLET | Freq: Two times a day (BID) | ORAL | Status: DC | PRN
  Filled 2019-05-01: qty 1

## 2019-05-01 MED ORDER — MAGNESIUM OXIDE 400 MG PO TABS: 800.0000 mg | ORAL_TABLET | Freq: Two times a day (BID) | ORAL | Status: DC

## 2019-05-01 MED ORDER — BORTEZOMIB CHEMO IV INJECTION 3.5 MG: 1.3000 mg/m2 | INTRAMUSCULAR | Status: DC

## 2019-05-01 MED ORDER — 0.9 % SODIUM CHLORIDE (POUR BTL) OPTIME
TOPICAL | Status: DC | PRN
  Administered 2019-05-01: 1000 mL

## 2019-05-01 MED ORDER — ACETAMINOPHEN 325 MG PO TABS: 325.0000 mg | ORAL_TABLET | Freq: Three times a day (TID) | ORAL | Status: DC | PRN

## 2019-05-01 MED ORDER — PHENYLEPHRINE 40 MCG/ML (10ML) SYRINGE FOR IV PUSH (FOR BLOOD PRESSURE SUPPORT)
PREFILLED_SYRINGE | INTRAVENOUS | Status: DC | PRN
  Administered 2019-05-01 (×2): 80 ug via INTRAVENOUS
  Administered 2019-05-01 (×2): 120 ug via INTRAVENOUS

## 2019-05-01 MED ORDER — POTASSIUM CHLORIDE CRYS ER 20 MEQ PO TBCR: 20.0000 meq | EXTENDED_RELEASE_TABLET | ORAL | Status: DC

## 2019-05-01 MED ORDER — MEGESTROL ACETATE 20 MG PO TABS: 20.0000 mg | ORAL_TABLET | Freq: Four times a day (QID) | ORAL | Status: DC

## 2019-05-01 MED ORDER — ONDANSETRON HCL 4 MG/2ML IJ SOLN
INTRAMUSCULAR | Status: AC
  Filled 2019-05-01: qty 2

## 2019-05-01 MED ORDER — DEXAMETHASONE SODIUM PHOSPHATE 10 MG/ML IJ SOLN
INTRAMUSCULAR | Status: DC | PRN
  Administered 2019-05-01: 5 mg via INTRAVENOUS

## 2019-05-01 MED ORDER — DEXAMETHASONE SODIUM PHOSPHATE 10 MG/ML IJ SOLN
INTRAMUSCULAR | Status: AC
  Filled 2019-05-01: qty 1

## 2019-05-01 MED ORDER — LIDOCAINE 2% (20 MG/ML) 5 ML SYRINGE
INTRAMUSCULAR | Status: DC | PRN
  Administered 2019-05-01: 60 mg via INTRAVENOUS

## 2019-05-01 MED ORDER — FENTANYL CITRATE (PF) 250 MCG/5ML IJ SOLN
INTRAMUSCULAR | Status: AC
  Filled 2019-05-01: qty 5

## 2019-05-01 MED ORDER — EPHEDRINE 5 MG/ML INJ
INTRAVENOUS | Status: AC
  Filled 2019-05-01: qty 10

## 2019-05-01 MED ORDER — PHENYLEPHRINE 40 MCG/ML (10ML) SYRINGE FOR IV PUSH (FOR BLOOD PRESSURE SUPPORT)
PREFILLED_SYRINGE | INTRAVENOUS | Status: AC
  Filled 2019-05-01: qty 10

## 2019-05-01 MED ORDER — MIDAZOLAM HCL 2 MG/2ML IJ SOLN
INTRAMUSCULAR | Status: AC
  Filled 2019-05-01: qty 2

## 2019-05-01 MED ORDER — ALBUMIN HUMAN 5 % IV SOLN
12.5000 g | Freq: Once | INTRAVENOUS | Status: DC
  Administered 2019-05-01: 12.5 g via INTRAVENOUS

## 2019-05-01 MED ORDER — LACTATED RINGERS IV SOLN
INTRAVENOUS | Status: DC
  Administered 2019-05-01 (×2): via INTRAVENOUS

## 2019-05-01 MED ORDER — DEXAMETHASONE 4 MG PO TABS: 10.0000 mg | ORAL_TABLET | ORAL | Status: DC

## 2019-05-01 MED ORDER — ONDANSETRON HCL 4 MG PO TABS: 4.0000 mg | ORAL_TABLET | Freq: Four times a day (QID) | ORAL | Status: DC | PRN

## 2019-05-01 MED ORDER — ONDANSETRON HCL 4 MG/2ML IJ SOLN
INTRAMUSCULAR | Status: DC | PRN
  Administered 2019-05-01: 4 mg via INTRAVENOUS

## 2019-05-01 MED ORDER — ZINC SULFATE 220 (50 ZN) MG PO CAPS
220.0000 mg | ORAL_CAPSULE | Freq: Every day | ORAL | Status: DC
  Administered 2019-05-02 – 2019-05-03 (×2): 220 mg via ORAL
  Filled 2019-05-01 (×2): qty 1

## 2019-05-01 MED ORDER — PROMETHAZINE HCL 25 MG/ML IJ SOLN: 6.2500 mg | INTRAMUSCULAR | Status: DC | PRN

## 2019-05-01 MED ORDER — POLYVINYL ALCOHOL 1.4 % OP SOLN
1.0000 [drp] | OPHTHALMIC | Status: DC | PRN
  Filled 2019-05-01: qty 15

## 2019-05-01 MED ORDER — POLYVINYL ALCOHOL-POVIDONE 5-6 MG/ML OP SOLN: 1.0000 [drp] | OPHTHALMIC | Status: DC | PRN

## 2019-05-01 MED ORDER — CYCLOPHOSPHAMIDE 50 MG PO CAPS: 600.0000 mg | ORAL_CAPSULE | ORAL | Status: DC

## 2019-05-01 MED ORDER — FENTANYL CITRATE (PF) 100 MCG/2ML IJ SOLN: 25.0000 ug | INTRAMUSCULAR | Status: DC | PRN

## 2019-05-01 MED ORDER — ASPIRIN EC 81 MG PO TBEC
81.0000 mg | DELAYED_RELEASE_TABLET | Freq: Every day | ORAL | Status: DC
  Administered 2019-05-02: 81 mg via ORAL
  Filled 2019-05-01: qty 1

## 2019-05-01 MED ORDER — LORATADINE 10 MG PO TABS
10.0000 mg | ORAL_TABLET | Freq: Every day | ORAL | Status: DC
  Administered 2019-05-02 – 2019-05-03 (×2): 10 mg via ORAL
  Filled 2019-05-01 (×2): qty 1

## 2019-05-01 MED ORDER — LIDOCAINE HCL 1 % IJ SOLN
INTRAMUSCULAR | Status: DC | PRN
  Administered 2019-05-01: 5 mL
  Administered 2019-05-01: 7 mL

## 2019-05-01 MED ORDER — HEPARIN (PORCINE) 25000 UT/250ML-% IV SOLN
1800.0000 [IU]/h | INTRAVENOUS | Status: DC
  Administered 2019-05-01 – 2019-05-02 (×2): 1800 [IU]/h via INTRAVENOUS
  Filled 2019-05-01: qty 250

## 2019-05-01 MED ORDER — ACETAMINOPHEN 325 MG PO TABS: 325.0000 mg | ORAL_TABLET | Freq: Once | ORAL | Status: DC | PRN

## 2019-05-01 MED ORDER — PROPOFOL 500 MG/50ML IV EMUL
INTRAVENOUS | Status: DC | PRN
  Administered 2019-05-01: 75 ug/kg/min via INTRAVENOUS

## 2019-05-01 MED ORDER — ADULT MULTIVITAMIN W/MINERALS CH
1.0000 | ORAL_TABLET | Freq: Every day | ORAL | Status: DC
  Administered 2019-05-02 – 2019-05-03 (×2): 1 via ORAL
  Filled 2019-05-01 (×2): qty 1

## 2019-05-01 MED ORDER — ZINC SULFATE 220 (50 ZN) MG PO TABS: 220.0000 mg | ORAL_TABLET | Freq: Every day | ORAL | Status: DC

## 2019-05-01 MED ORDER — LACTATED RINGERS IV SOLN: INTRAVENOUS | Status: DC

## 2019-05-01 MED ORDER — SODIUM CHLORIDE 0.9 % IV SOLN
INTRAVENOUS | Status: DC | PRN
  Administered 2019-05-01: 50 ug/min via INTRAVENOUS

## 2019-05-01 SURGICAL SUPPLY — 30 items
BRUSH SCRUB EZ PLAIN DRY (MISCELLANEOUS) ×4 IMPLANT
CANISTER SUCT 3000ML PPV (MISCELLANEOUS) ×2 IMPLANT
COVER PROBE W GEL 5X96 (DRAPES) ×2 IMPLANT
COVER SURGICAL LIGHT HANDLE (MISCELLANEOUS) ×2 IMPLANT
COVER TRANSDUCER ULTRASND GEL (DRAPE) ×1 IMPLANT
COVER WAND RF STERILE (DRAPES) ×2 IMPLANT
DERMABOND ADVANCED (GAUZE/BANDAGES/DRESSINGS) ×2
DERMABOND ADVANCED .7 DNX12 (GAUZE/BANDAGES/DRESSINGS) IMPLANT
DRAPE C-ARM 42X72 X-RAY (DRAPES) ×2 IMPLANT
DRAPE LAPAROSCOPIC ABDOMINAL (DRAPES) ×2 IMPLANT
GLOVE BIO SURGEON STRL SZ 6.5 (GLOVE) ×4 IMPLANT
GLOVE BIOGEL PI IND STRL 6 (GLOVE) IMPLANT
GLOVE BIOGEL PI INDICATOR 6 (GLOVE) ×2
GOWN STRL REUS W/ TWL LRG LVL3 (GOWN DISPOSABLE) ×2 IMPLANT
GOWN STRL REUS W/TWL LRG LVL3 (GOWN DISPOSABLE) ×2
KIT BASIN OR (CUSTOM PROCEDURE TRAY) ×2 IMPLANT
KIT PLEURX DRAIN CATH 1000ML (MISCELLANEOUS) ×4 IMPLANT
KIT PLEURX DRAIN CATH 15.5FR (DRAIN) ×3 IMPLANT
KIT TURNOVER KIT B (KITS) ×2 IMPLANT
NS IRRIG 1000ML POUR BTL (IV SOLUTION) ×2 IMPLANT
PACK GENERAL/GYN (CUSTOM PROCEDURE TRAY) ×2 IMPLANT
PAD ARMBOARD 7.5X6 YLW CONV (MISCELLANEOUS) ×4 IMPLANT
SET DRAINAGE LINE (MISCELLANEOUS) IMPLANT
SUT ETHILON 3 0 FSL (SUTURE) ×4 IMPLANT
SUT VIC AB 3-0 X1 27 (SUTURE) ×2 IMPLANT
SYR CONTROL 10ML LL (SYRINGE) ×2 IMPLANT
TOWEL GREEN STERILE (TOWEL DISPOSABLE) ×2 IMPLANT
TOWEL GREEN STERILE FF (TOWEL DISPOSABLE) ×2 IMPLANT
VALVE REPLACEMENT CAP (MISCELLANEOUS) IMPLANT
WATER STERILE IRR 1000ML POUR (IV SOLUTION) ×2 IMPLANT

## 2019-05-01 NOTE — Progress Notes (Signed)
Progress Note  Patient Name: Erik Costa Date of Encounter: 05/01/2019  Primary Cardiologist: Clabe Seal  Subjective   Still SOB and weak  On heparin with no bleeding  CXR with re-accumulating bilateral pleural effusions. Has been seen by TCTS. Plan for bilateral Pleurex tubes later today  Torsemide restarted yesterday. Weight unchanged  Inpatient Medications    Scheduled Meds: . [MAR Hold] feeding supplement (ENSURE ENLIVE)  237 mL Oral BID BM  . [MAR Hold] Gerhardt's butt cream   Topical BID  . [MAR Hold] mouth rinse  15 mL Mouth Rinse BID  . [MAR Hold] Melatonin  6 mg Oral QHS  . [MAR Hold] midodrine  5 mg Oral TID WC  . [MAR Hold] torsemide  40 mg Oral Daily   Continuous Infusions: . sodium chloride Stopped (04/25/19 0936)  . acetaminophen    . heparin    . lactated ringers 10 mL/hr at 05/01/19 1152  . lactated ringers     PRN Meds:    Vital Signs    Vitals:   04/30/19 1956 04/30/19 2351 05/01/19 0405 05/01/19 1144  BP: 92/67 (!) 88/63 92/68   Pulse: 91 86 88   Resp:      Temp: 97.7 F (36.5 C) (!) 97.4 F (36.3 C) (!) 97.5 F (36.4 C)   TempSrc: Oral Oral Oral   SpO2: 94% 95% 96%   Weight:   71.1 kg 71.1 kg  Height:    6' 2.02" (1.88 m)    Intake/Output Summary (Last 24 hours) at 05/01/2019 1417 Last data filed at 05/01/2019 1400 Gross per 24 hour  Intake 2342.43 ml  Output 3485 ml  Net -1142.57 ml   Last 3 Weights 05/01/2019 05/01/2019 04/29/2019  Weight (lbs) 156 lb 11.2 oz 156 lb 11.2 oz 156 lb 1.6 oz  Weight (kg) 71.079 kg 71.079 kg 70.806 kg      Telemetry    Sinus rhythm 80-90s. Personally reviewed   ECG    na - Personally Reviewed  Physical Exam   General:  Sitting in chair No resp difficulty HEENT: normal multiple ecchymosis Neck: supple. JVP to jaw. Carotids 2+ bilat; no bruits. No lymphadenopathy or thryomegaly appreciated. Cor: PMI nondisplaced. Regular rate & rhythm. 2/6 TR Lungs: dull 1/2 up B Abdomen: soft,  nontender, nondistended. No hepatosplenomegaly. No bruits or masses. Good bowel sounds. Extremities: no cyanosis, clubbing, rash, 2-3+ edema + TED hose Neuro: alert & orientedx3, cranial nerves grossly intact. moves all 4 extremities w/o difficulty. Affect pleasant   Labs    Chemistry Recent Labs  Lab 04/27/19 0422 04/28/19 0043  04/30/19 0909 04/30/19 1458 05/01/19 0443  NA 133* 135   < > 133* 136 134*  K 3.6 4.0   < > 3.7 4.2 3.8  CL 93* 96*   < > 95* 97* 93*  CO2 30 29   < > _0 GLUCOSE 116* 115*   < > 125* 121* 116*  BUN 63* 62*   < > 47* 45* 42*  CREATININE 2.47* 2.33*   < > 1.78* 1.83* 1.68*  CALCIUM 8.4* 8.5*   < > 8.6* 8.9 8.7*  PROT 5.2* 5.1*  --   --  5.5*  --   ALBUMIN 1.8* 1.7*  --   --  1.8*  --   AST 18 20  --   --  22  --   ALT 31 29  --   --  31  --   ALKPHOS 81 79  --   --  93  --   BILITOT 0.9 1.0  --   --  1.0  --   GFRNONAA 27* 29*   < > 41* 39* 43*  GFRAA 32* 34*   < > 47* 45* 50*  ANIONGAP 10 10   < > _0 < > = values in this interval not displayed.     Hematology Recent Labs  Lab 04/30/19 0909 04/30/19 1458 05/01/19 0443  WBC 3.9* 3.3* 3.9*  RBC 3.38* 3.40* 3.49*  HGB 12.7* 12.9* 13.0  HCT 36.7* 36.8* 37.9*  MCV 108.6* 108.2* 108.6*  MCH 37.6* 37.9* 37.2*  MCHC 34.6 35.1 34.3  RDW 14.5 14.4 14.2  PLT 230 234 232    Cardiac Enzymes No results for input(s): TROPONINI in the last 168 hours. No results for input(s): TROPIPOC in the last 168 hours.   BNP Recent Labs  Lab 04/25/19 0404  BNP 2,888.1*     DDimer No results for input(s): DDIMER in the last 168 hours.   Radiology    Dg Chest 2 View  Result Date: 05/01/2019 CLINICAL DATA:  Preop for pleural drain insertion. EXAM: CHEST - 2 VIEW COMPARISON:  One-view chest x-ray 04/29/2019. CT of the chest 04/23/2019. FINDINGS: Heart size is upper limits of normal. Bilateral pleural effusions and basilar airspace disease is similar the prior study. There is moderate diffuse  edema. Visualized soft tissues and bony thorax are unremarkable. IMPRESSION: 1. Similar appearance of bilateral pleural effusions and basilar airspace disease. This likely reflects atelectasis, infection is not excluded. 2. Borderline cardiomegaly with stable moderate edema. Electronically Signed   By: San Morelle M.D.   On: 05/01/2019 10:20   Dg C-arm 1-60 Min-no Report  Result Date: 05/01/2019 Fluoroscopy was utilized by the requesting physician.  No radiographic interpretation.    Patient Profile      Erik Costa is a 58 y.o. 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  Assessment & Plan    1. Acute hypoxic respiratory failure - likely multifactorial related to PE, HF, and bilateral pleural effusions - 04/2019 echo LVEF 45-50%, cannot eval diastolic function, moderate RV dysfunction. Severe LVH - now improved. - Volume status elevated. Trosemide restarted yesterday. TED hose placed. May need a dose or two of IV lasix. Will re-evaluate post-op - for bilateral Pleurex tubes later today(d/w TCTS personally)   2. Pleural effusions, recurrent - s/p thoracentesis right side 6/11, left side 6/12 - fluid re-accumulating - for bilateral Pleurex tubes later today  3. 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 Torsemide restarted yesterday. TED hose placed. May  need a dose or two of IV lasix. Will re-evaluate post-op  4. Bilateral PE - on hep gtt, can convert to DOAC once pleurex tubes  5. Hypotension - off pressors, has been on midodrine. Continue midodrine 10 tid  6. VT - occurred in setting of hypoxia, respiratory distress - had been on amiodarone, now off  7. Multiple  myeloma with AL amyloidosis with heart, kidney and positive fat pad involvement (dx in 2019) - Treated by hematology/oncology with cyclophosphamide,Velcade, dexamethasone     For questions or updates, please contact [place] Please consult [email or web address] for contact info under        Signed, Glori Bickers, MD  05/01/2019, 2:17 PM

## 2019-05-01 NOTE — Progress Notes (Signed)
PROGRESS NOTE    Erik Costa  GBT:[id number] DOB: 29-Apr-1961 DOA: 04/23/2019 PCP: Azzie Roup, MD   Brief Narrative: Erik Costa is a 58 y.o. male with a history of heart failure, amyloidosis, recurrent pleural effusions, multiple myeloma. He presented secondary to sudden collapse and found to have a pulmonary embolism. Course complicated by respiratory and circulatory failure requiring intubation and vasopressors. Cardiology consulted for heart failure.   Assessment & Plan:   Active Problems:   Pulmonary embolus (HCC)   Protein-calorie malnutrition, severe  Pulmonary embolism No history. Associated right heart strain on CT. Started on heparin drip -Continue heparin drip with plan to switch to oral anticoagulation. Appears NOAC will be an option  Acute on chronic diastolic heart failure Transthoracic Echocardiogram with an EF of 45-50%. Previously diuresed with Lasix IV which has been stopped secondary to worsening AKI in addition to worsening hypotension. Restarted on torsemide Cardiology/heart failure team on board -Cardiology recommendations: torsemide -Elevate legs  Acute respiratory failure with hypoxia Pulmonary edema Intubated from 6/8 to 6/10. Patient is s/p bilateral thoracentesis. Difficult to manage with diuresis secondary to hypotension. CT surgery eval pending -Wean to room air -Plan for possible Pleurx x2 today  Hypotension Chronic and worsened. Was previously on vasopressors but weaned off. Goal MAP of >60 per cardiology. Midodrine initiated. Complicating diuresis attempts. Improved.  Amyloidosis Multiple myeloma Cardiac and renal involvement. On cyclophosphamide and Velcade therapy. Poor prognosis. Follows with [facility].  Ventricular tachycardia Per chart review, secondary to hypoxia and respiratory distress. Was treated with amiodarone which has now been discontinued.  Somnolence Likely secondary to poor sleep. Will need to watch today  for improvement. If no improvement, will obtain an ABG  DVT prophylaxis: Heparin drip Code Status:   Code Status: Full Code Family Communication: None Disposition Plan: Discharge pending cardiology recommendations, possibly in 24 hours. PT/OT eval pending   Consultants:   PCCM  Cardiology  TCTS  Procedures:   6/8-6/10: ETT  6/9: Transthoracic Echocardiogram IMPRESSIONS    1. The left ventricle has mildly reduced systolic function, with an ejection fraction of 45-50%. The cavity size was normal. There is severe concentric left ventricular hypertrophy. Left ventricular diastolic function could not be evaluated due to  indeterminate diastolic function. Elevated left ventricular end-diastolic pressure No evidence of left ventricular regional wall motion abnormalities.  2. The right ventricle has moderately reduced systolic function. The cavity was normal. There is no increase in right ventricular wall thickness. Right ventricular systolic pressure could not be assessed.  3. Right atriu is severely dilated. There is significant spontaneous echo contrast in the RA with no obvious thrombus.  4. Large pleural effusion in the left lateral region.  5. The pericardial effusion is posterior to the left ventricle.  6. Trivial pericardial effusion is present.  7. The inferior vena cava was dilated in size with <50% respiratory variability.  8. Given severe LVH, increased filling pressures and speckeled appearance of myocardium in addition to trace pericardial effusion, consider cardiac MRI to rulw out amyloidosis.  Antimicrobials:  None    Subjective: Did not sleep well overnight. No chest pain. Dyspnea stable.  Objective: Vitals:   04/30/19 1739 04/30/19 1956 04/30/19 2351 05/01/19 0405  BP: 94/73 92/67 (!) 88/63 92/68  Pulse: 89 91 86 88  Resp:      Temp:  97.7 F (36.5 C) (!) 97.4 F (36.3 C) (!) 97.5 F (36.4 C)  TempSrc:  Oral Oral Oral  SpO2: 98% 94% 95% 96%  Weight:  71.1 kg  Height:        Intake/Output Summary (Last 24 hours) at 05/01/2019 1029 Last data filed at 05/01/2019 0953 Gross per 24 hour  Intake 1822.43 ml  Output 1600 ml  Net 222.43 ml   Filed Weights   04/28/19 0457 04/29/19 1200 05/01/19 0405  Weight: 71.1 kg 70.8 kg 71.1 kg    Examination: General exam: Appears calm and comfortable Respiratory system: Diminished bilaterally near bases. Respiratory effort normal. Cardiovascular system: S1 & S2 heard, RRR. No murmurs, rubs, gallops or clicks. Gastrointestinal system: Abdomen is nondistended, soft and nontender. No organomegaly or masses felt. Normal bowel sounds heard. Central nervous system: Somnolent but arouses easily and oriented. No focal neurological deficits. Extremities: No edema. No calf tenderness Skin: No cyanosis. Ecchymosis Psychiatry: Judgement and insight appear normal. Mood & affect appropriate.      Data Reviewed: I have personally reviewed following labs and imaging studies  CBC: Recent Labs  Lab 04/26/19 0522  04/28/19 0043 04/29/19 0702 04/30/19 0909 04/30/19 1458 05/01/19 0443  WBC 3.7*   < > 4.9 4.4 3.9* 3.3* 3.9*  NEUTROABS 3.3  --   --   --   --   --   --   HGB 12.1*   < > 11.8* 12.1* 12.7* 12.9* 13.0  HCT 34.9*   < > 33.5* 35.1* 36.7* 36.8* 37.9*  MCV 111.1*   < > 107.7* 108.3* 108.6* 108.2* 108.6*  PLT 189   < > 203 216 230 234 232   < > = values in this interval not displayed.   Basic Metabolic Panel: Recent Labs  Lab 04/25/19 0403 04/26/19 0522 04/27/19 0422 04/28/19 0043 04/29/19 0750 04/30/19 0909 04/30/19 1458 05/01/19 0443  NA 137 136 133* 135 133* 133* 136 134*  K 4.2 4.2 3.6 4.0 4.0 3.7 4.2 3.8  CL 95* 94* 93* 96* 94* 95* 97* 93*  CO2 31 29 30 29 28 27 29 30   GLUCOSE 126* 99 116* 115* 100* 125* 121* 116*  BUN 38* 53* 63* 62* 49* 47* 45* 42*  CREATININE 1.50* 2.26* 2.47* 2.33* 1.95* 1.78* 1.83* 1.68*  CALCIUM 8.8* 8.7* 8.4* 8.5* 8.5* 8.6* 8.9 8.7*  MG 2.0 2.1 2.1 2.1   --   --   --   --   PHOS  --  5.4* 4.3 3.5  --   --   --   --    GFR: Estimated Creatinine Clearance: 47 mL/min (A) (by C-G formula based on SCr of 1.68 mg/dL (H)). Liver Function Tests: Recent Labs  Lab 04/25/19 0403 04/26/19 0522 04/26/19 1331 04/27/19 0422 04/28/19 0043 04/30/19 1458  AST 22 24  --  18 20 22   ALT 37 34  --  31 29 31   ALKPHOS 98 92  --  81 79 93  BILITOT 0.6 0.9  --  0.9 1.0 1.0  PROT 5.5* 5.3* 5.3* 5.2* 5.1* 5.5*  ALBUMIN 1.9* 1.9*  --  1.8* 1.7* 1.8*   No results for input(s): LIPASE, AMYLASE in the last 168 hours. No results for input(s): AMMONIA in the last 168 hours. Coagulation Profile: Recent Labs  Lab 04/30/19 1458  INR 1.1   Cardiac Enzymes: No results for input(s): CKTOTAL, CKMB, CKMBINDEX, TROPONINI in the last 168 hours. BNP (last 3 results) No results for input(s): PROBNP in the last 8760 hours. HbA1C: No results for input(s): HGBA1C in the last 72 hours. CBG: Recent Labs  Lab 04/26/19 1338 04/27/19 0732 04/27/19 1135 04/27/19 1614 04/27/19  1919  GLUCAP 148* 131* 164* 109* 125*   Lipid Profile: No results for input(s): CHOL, HDL, LDLCALC, TRIG, CHOLHDL, LDLDIRECT in the last 72 hours. Thyroid Function Tests: No results for input(s): TSH, T4TOTAL, FREET4, T3FREE, THYROIDAB in the last 72 hours. Anemia Panel: No results for input(s): VITAMINB12, FOLATE, FERRITIN, TIBC, IRON, RETICCTPCT in the last 72 hours. Sepsis Labs: No results for input(s): PROCALCITON, LATICACIDVEN in the last 168 hours.  Recent Results (from the past 240 hour(s))  Culture, blood (routine x 2)     Status: None   Collection Time: 04/23/19  3:41 PM   Specimen: BLOOD  Result Value Ref Range Status   Specimen Description BLOOD SITE NOT SPECIFIED  Final   Special Requests   Final    BOTTLES DRAWN AEROBIC ONLY Blood Culture results may not be optimal due to an inadequate volume of blood received in culture bottles   Culture   Final    NO GROWTH 5 DAYS  Performed at [facility], [place] [address], [place], [place] [address]    Report Status 04/28/2019 FINAL  Final  Culture, blood (routine x 2)     Status: None   Collection Time: 04/23/19  4:34 PM   Specimen: BLOOD  Result Value Ref Range Status   Specimen Description BLOOD RIGHT ANTECUBITAL  Final   Special Requests   Final    BOTTLES DRAWN AEROBIC AND ANAEROBIC Blood Culture adequate volume   Culture   Final    NO GROWTH 5 DAYS Performed at [facility], [place] [address], [place], [place] [address]    Report Status 04/28/2019 FINAL  Final  SARS Coronavirus 2 (CEPHEID- Performed in [place] hospital lab), Hosp Order     Status: None   Collection Time: 04/23/19  5:06 PM   Specimen: Nasopharyngeal Swab  Result Value Ref Range Status   SARS Coronavirus 2 NEGATIVE NEGATIVE Final    Comment: (NOTE) If result is NEGATIVE SARS-CoV-2 target nucleic acids are NOT DETECTED. The SARS-CoV-2 RNA is generally detectable in upper and lower  respiratory specimens during the acute phase of infection. The lowest  concentration of SARS-CoV-2 viral copies this assay can detect is 250  copies / mL. A negative result does not preclude SARS-CoV-2 infection  and should not be used as the sole basis for treatment or other  patient management decisions.  A negative result may occur with  improper specimen collection / handling, submission of specimen other  than nasopharyngeal swab, presence of viral mutation(s) within the  areas targeted by this assay, and inadequate number of viral copies  (<250 copies / mL). A negative result must be combined with clinical  observations, patient history, and epidemiological information. If result is POSITIVE SARS-CoV-2 target nucleic acids are DETECTED. The SARS-CoV-2 RNA is generally detectable in upper and lower  respiratory specimens dur ing the acute phase of infection.  Positive  results are indicative of active infection with SARS-CoV-2.  Clinical   correlation with patient history and other diagnostic information is  necessary to determine patient infection status.  Positive results do  not rule out bacterial infection or co-infection with other viruses. If result is PRESUMPTIVE POSTIVE SARS-CoV-2 nucleic acids MAY BE PRESENT.   A presumptive positive result was obtained on the submitted specimen  and confirmed on repeat testing.  While 2019 novel coronavirus  (SARS-CoV-2) nucleic acids may be present in the submitted sample  additional confirmatory testing may be necessary for epidemiological  and /  or clinical management purposes  to differentiate between  SARS-CoV-2 and other Sarbecovirus currently known to infect humans.  If clinically indicated additional testing with an alternate test  methodology [phone]) is advised. The SARS-CoV-2 RNA is generally  detectable in upper and lower respiratory sp ecimens during the acute  phase of infection. The expected result is Negative. Fact Sheet for Patients:  StrictlyIdeas.no Fact Sheet for Healthcare Providers: BankingDealers.co.za This test is not yet approved or cleared by the [place] FDA and has been authorized for detection and/or diagnosis of SARS-CoV-2 by FDA under an Emergency Use Authorization (EUA).  This EUA will remain in effect (meaning this test can be used) for the duration of the COVID-19 declaration under Section 564(b)(1) of the Act, 21 U.S.C. section 360bbb-3(b)(1), unless the authorization is terminated or revoked sooner. Performed at [facility], [place] [address] [place], [place] [address]   MRSA PCR Screening     Status: None   Collection Time: 04/23/19 11:00 PM   Specimen: Nasopharyngeal  Result Value Ref Range Status   MRSA by PCR NEGATIVE NEGATIVE Final    Comment:        The GeneXpert MRSA Assay (FDA approved for NASAL specimens only), is one component of a comprehensive MRSA colonization  surveillance program. It is not intended to diagnose MRSA infection nor to guide or monitor treatment for MRSA infections. Performed at [facility], [place] [address], [place], [place] [address]   Body fluid culture     Status: None   Collection Time: 04/26/19  1:10 PM   Specimen: Pleura; Body Fluid  Result Value Ref Range Status   Specimen Description PLEURAL  Final   Special Requests FLUID  Final   Gram Stain   Final    RARE WBC PRESENT,BOTH PMN AND MONONUCLEAR NO ORGANISMS SEEN    Culture   Final    NO GROWTH Performed at [facility], [address], [place], [place] [address]    Report Status 04/29/2019 FINAL  Final  Surgical pcr screen     Status: None   Collection Time: 04/30/19 12:37 PM   Specimen: Nasal Mucosa; Nasal Swab  Result Value Ref Range Status   MRSA, PCR NEGATIVE NEGATIVE Final   Staphylococcus aureus NEGATIVE NEGATIVE Final    Comment: (NOTE) The Xpert SA Assay (FDA approved for NASAL specimens in patients 44 years of age and older), is one component of a comprehensive surveillance program. It is not intended to diagnose infection nor to guide or monitor treatment. Performed at [facility], [place] [address] [place], [place] [address]          Radiology Studies: Dg Chest 2 View  Result Date: 05/01/2019 CLINICAL DATA:  Preop for pleural drain insertion. EXAM: CHEST - 2 VIEW COMPARISON:  One-view chest x-ray 04/29/2019. CT of the chest 04/23/2019. FINDINGS: Heart size is upper limits of normal. Bilateral pleural effusions and basilar airspace disease is similar the prior study. There is moderate diffuse edema. Visualized soft tissues and bony thorax are unremarkable. IMPRESSION: 1. Similar appearance of bilateral pleural effusions and basilar airspace disease. This likely reflects atelectasis, infection is not excluded. 2. Borderline cardiomegaly with stable moderate edema. Electronically Signed   By: San Morelle M.D.   On:  05/01/2019 10:20   Dg Chest Port 1 View  Result Date: 04/29/2019 CLINICAL DATA:  Shortness of breath. EXAM: PORTABLE CHEST 1 VIEW COMPARISON:  April 27, 2019 FINDINGS: Bilateral pleural effusions with underlying  opacities are more prominent the interval. No other interval changes. IMPRESSION: Increasing bilateral pleural effusions with underlying opacities. Electronically Signed   By: Dorise Bullion III M.D   On: 04/29/2019 13:56        Scheduled Meds: . feeding supplement (ENSURE ENLIVE)  237 mL Oral BID BM  . Gerhardt's butt cream   Topical BID  . mouth rinse  15 mL Mouth Rinse BID  . Melatonin  6 mg Oral QHS  . midodrine  5 mg Oral TID WC  . torsemide  40 mg Oral Daily   Continuous Infusions: . sodium chloride Stopped (04/25/19 0936)  .  ceFAZolin (ANCEF) IV    . heparin 1,800 Units/hr (05/01/19 1022)     LOS: 8 days     Cordelia Poche, MD [facility] 05/01/2019, 10:29 AM  If 7PM-7AM, please contact night-coverage [email or web address]

## 2019-05-01 NOTE — Brief Op Note (Signed)
      [address]       [place] [address]             [phone]      05/01/2019  4:04 PM  PATIENT:  Erik Costa  58 y.o. male  PRE-OPERATIVE DIAGNOSIS:  BILATERAL PLEURAL EFFUSIONS  POST-OPERATIVE DIAGNOSIS:  BILATERAL PLEURAL EFFUSIONS  PROCEDURE:  Procedure(s): INSERTION BILATERAL PLEURAL DRAINAGE CATHETER (Bilateral)  SURGEON:  Surgeon(s) and Role:    Grace Isaac, MD - Primary  PHYSICIAN ASSISTANT:  None.   ANESTHESIA:   general  EBL:  10 mL   BLOOD ADMINISTERED:none  DRAINS: bilateral pleurx catheter insertion   LOCAL MEDICATIONS USED:  NONE  SPECIMEN:  No Specimen  DISPOSITION OF SPECIMEN:  PATHOLOGY  COUNTS:  YES  DICTATION: .Dragon Dictation  PLAN OF CARE: Admit to inpatient   PATIENT DISPOSITION:  PACU - hemodynamically stable.   Delay start of Pharmacological VTE agent (>24hrs) due to surgical blood loss or risk of bleeding: yes

## 2019-05-01 NOTE — Transfer of Care (Signed)
Immediate Anesthesia Transfer of Care Note  Patient: Erik Costa  Procedure(s) Performed: INSERTION BILATERAL PLEURAL DRAINAGE CATHETER (Bilateral Chest)  Patient Location: PACU  Anesthesia Type:MAC  Level of Consciousness: awake, alert , oriented and drowsy  Airway & Oxygen Therapy: Patient Spontanous Breathing and Patient connected to face mask oxygen  Post-op Assessment: Report given to RN and Post -op Vital signs reviewed and stable  Post vital signs: Reviewed and stable  Last Vitals:  Vitals Value Taken Time  BP 95/71 05/01/19 1422  Temp    Pulse 74 05/01/19 1424  Resp 17 05/01/19 1424  SpO2 100 % 05/01/19 1424  Vitals shown include unvalidated device data.  Last Pain:  Vitals:   05/01/19 0745  TempSrc:   PainSc: 0-No pain      Patients Stated Pain Goal: 0 (03/70/96 4383)  Complications: No apparent anesthesia complications

## 2019-05-01 NOTE — Anesthesia Procedure Notes (Signed)
Date/Time: 05/01/2019 12:38 PM Performed by: Trinna Post., CRNA Pre-anesthesia Checklist: Patient identified, Emergency Drugs available, Patient being monitored, Timeout performed and Suction available Patient Re-evaluated:Patient Re-evaluated prior to induction Oxygen Delivery Method: Simple face mask Preoxygenation: Pre-oxygenation with 100% oxygen Induction Type: IV induction Placement Confirmation: positive ETCO2

## 2019-05-01 NOTE — Consult Note (Signed)
[place] Nurse wound consult note Reason for Consult:Moisture associated skin damage to bilateral buttocks at the apex of the gluteal cleft on both sides of the coccyx.  Patient states this began as serum filled blistering.  Albumin is 1.5 and BMI is los (20) placing patient at further risk for breakdown.  Patient able to turn self in bed with prompting and I stressed the importance of position changes.  He verbalizes understanding.  Surrounding skin is nonblanchable erythema and at risk for further breakdown.  Wound type:pressure and moisture.  Due to mirror images on each side of buttocks and not over a bony prominence, skin injury more consistent with MASD at this time.  Will implement barrier cream Pressure Injury POA: No  Not noted on admission Measurement: 0.5 cm ruptured serum filled blisters on upper buttocks, both sides Wound PPJ:KDTO pink and moist Drainage (amount, consistency, odor) minimal serosanguinous  No odor Periwound: nonblanchable erythema Dressing procedure/placement/frequency:Cleanse buttocks with soap and water and pat dry  Apply Gerhardts butt paste twice daily and PRN soilage    Will not follow at this time.  Please re-consult if needed.  Domenic Moras MSN, RN, FNP-BC CWON Wound, Ostomy, Continence Nurse Pager [phone]

## 2019-05-01 NOTE — Anesthesia Preprocedure Evaluation (Signed)
Anesthesia Evaluation  Patient identified by MRN, date of birth, ID band Patient awake    Reviewed: Allergy & Precautions, NPO status , Patient's Chart, lab work & pertinent test results  Airway Mallampati: I  TM Distance: >3 FB Neck ROM: Full    Dental  (+) Teeth Intact, Dental Advisory Given   Pulmonary neg pulmonary ROS,     + decreased breath sounds      Cardiovascular negative cardio ROS   Rhythm:Regular Rate:Normal     Neuro/Psych negative neurological ROS  negative psych ROS   GI/Hepatic Neg liver ROS,   Endo/Other    Renal/GU negative Renal ROS     Musculoskeletal negative musculoskeletal ROS (+)   Abdominal Normal abdominal exam  (+)   Peds  Hematology   Anesthesia Other Findings   Reproductive/Obstetrics                             Anesthesia Physical Anesthesia Plan  ASA: II  Anesthesia Plan: MAC   Post-op Pain Management:    Induction: Intravenous  PONV Risk Score and Plan: 2 and Ondansetron, Propofol infusion and Midazolam  Airway Management Planned: Natural Airway and Simple Face Mask  Additional Equipment:   Intra-op Plan:   Post-operative Plan: Extubation in OR  Informed Consent: I have reviewed the patients History and Physical, chart, labs and discussed the procedure including the risks, benefits and alternatives for the proposed anesthesia with the patient or authorized representative who has indicated his/her understanding and acceptance.       Plan Discussed with: CRNA  Anesthesia Plan Comments:         Anesthesia Quick Evaluation

## 2019-05-01 NOTE — Plan of Care (Signed)
  Problem: Nutrition: Goal: Adequate nutrition will be maintained Outcome: Progressing Note: Good appetite and adequate nutrition noted.   Problem: Elimination: Goal: Will not experience complications related to urinary retention Outcome: Progressing Note: No s/s of urinary retention; voiding without difficulty.   Problem: Pain Managment: Goal: General experience of comfort will improve Outcome: Progressing Note: Denies c/o pain or discomfort.

## 2019-05-01 NOTE — Anesthesia Postprocedure Evaluation (Signed)
Anesthesia Post Note  Patient: Erik Costa  Procedure(s) Performed: INSERTION BILATERAL PLEURAL DRAINAGE CATHETER (Bilateral Chest)     Patient location during evaluation: PACU Anesthesia Type: MAC Level of consciousness: awake and alert Pain management: pain level controlled Vital Signs Assessment: post-procedure vital signs reviewed and stable Respiratory status: spontaneous breathing, nonlabored ventilation, respiratory function stable and patient connected to nasal cannula oxygen Cardiovascular status: stable and blood pressure returned to baseline Postop Assessment: no apparent nausea or vomiting Anesthetic complications: no    Last Vitals:  Vitals:   05/01/19 1500 05/01/19 1515  BP: (!) 75/57 (!) 78/57  Pulse: 84 81  Resp: 15 (!) 7  Temp:  (!) 36.4 C  SpO2: 96% 96%    Last Pain:  Vitals:   05/01/19 1450  TempSrc:   PainSc: 0-No pain                 Effie Berkshire

## 2019-05-01 NOTE — Progress Notes (Signed)
ANTICOAGULATION CONSULT NOTE   Pharmacy Consult for Heparin Indication: pulmonary embolus  No Known Allergies  Patient Measurements: Height: 6' 2.02" (188 cm) Weight: 156 lb 11.2 oz (71.1 kg) IBW/kg (Calculated) : 82.24  Heparin dosing weight: 70.6kg  Vital Signs: Temp: 97.6 F (36.4 C) (06/16 2014) Temp Source: Oral (06/16 2014) BP: 80/63 (06/16 2014) Pulse Rate: 89 (06/16 2015)  Labs: Recent Labs    04/30/19 0909 04/30/19 1458 05/01/19 0443 05/01/19 2133  HGB 12.7* 12.9* 13.0  --   HCT 36.7* 36.8* 37.9*  --   PLT 230 234 232  --   APTT  --  104*  --   --   LABPROT  --  14.0  --   --   INR  --  1.1  --   --   HEPARINUNFRC 0.74*  --  1.02* 0.50  CREATININE 1.78* 1.83* 1.68*  --     Estimated Creatinine Clearance: 47 mL/min (A) (by C-G formula based on SCr of 1.68 mg/dL (H)).   Medical History: History reviewed. No pertinent past medical history.  Assessment: Erik Costa is a 58yo male admitted with pulmonary embolism. Pharmacy consulted to start heparin infusion. Chest CT: Acute pulmonary embolus involving RUL and RLL. Clot burden is small to moderate. Positive for acute PE with CT evidence of right heart strain. RV/LV Ratio = 1.1. Reviewed outpatient notes from [facility] and patient not on prior anticoagulation. Patient s/p thoracentesis 6/12 - and new accumulation so probable pleurx this week.  S/p insertion of bilateral pleur X catheters, heparin restarted.  PM heparin level therapeutic   Goal of Therapy:  Heparin level 0.3-0.7 units/ml Monitor platelets by anticoagulation protocol: Yes   Plan:  Continue heparin at current rate Check daily heparin level and CBC  Monitor for s/sx of bleeding F/u plans for oral anticoagulation after OR  Thank you Anette Guarneri, PharmD 05/01/2019 10:06 PM

## 2019-05-01 NOTE — Progress Notes (Signed)
PT Cancellation Note  Patient Details Name: Erik Costa MRN: [id number] DOB: 1961/03/25   Cancelled Treatment:    Reason Eval/Treat Not Completed: Patient at procedure or test/unavailable Pt is off floor for procedure. PT will follow back for Evaluation this afternoon as able.   Curvin Hunger B. Migdalia Dk PT, DPT Acute Rehabilitation Services Pager [phone] Office [phone]    [place] 05/01/2019, 11:34 AM

## 2019-05-01 NOTE — Progress Notes (Addendum)
ANTICOAGULATION CONSULT NOTE   Pharmacy Consult for Heparin Indication: pulmonary embolus  No Known Allergies  Patient Measurements: Height: 6\' 2"  (188 cm) Weight: 156 lb 11.2 oz (71.1 kg) IBW/kg (Calculated) : 82.2  Heparin dosing weight: 70.6kg  Vital Signs: Temp: 97.5 F (36.4 C) (06/16 0405) Temp Source: Oral (06/16 0405) BP: 92/68 (06/16 0405) Pulse Rate: 88 (06/16 0405)  Labs: Recent Labs    04/29/19 2133 04/30/19 0909 04/30/19 1458 05/01/19 0443  HGB  --  12.7* 12.9* 13.0  HCT  --  36.7* 36.8* 37.9*  PLT  --  230 234 232  APTT  --   --  104*  --   LABPROT  --   --  14.0  --   INR  --   --  1.1  --   HEPARINUNFRC 0.51 0.74*  --  1.02*  CREATININE  --  1.78* 1.83* 1.68*    Estimated Creatinine Clearance: 47 mL/min (A) (by C-G formula based on SCr of 1.68 mg/dL (H)).   Medical History: History reviewed. No pertinent past medical history.  Assessment: Erik Costa is a 58yo male admitted with pulmonary embolism. Pharmacy consulted to start heparin infusion. Chest CT: Acute pulmonary embolus involving RUL and RLL. Clot burden is small to moderate. Positive for acute PE with CT evidence of right heart strain. RV/LV Ratio = 1.1. Reviewed outpatient notes from [facility] and patient not on prior anticoagulation. Patient s/p thoracentesis 6/12 - and new accumulation so probable pleurx this week.    Heparin level is 1.02, on 2000 unit/hr. Level drawn from opposite side of heparin infusion.  Hgb 13, plt 232. No s/sx of bleeding. No infusion issues.   Goal of Therapy:  Heparin level 0.3-0.7 units/ml Monitor platelets by anticoagulation protocol: Yes   Plan:  Decrease  heparin 1800 units/hr Check daily heparin level and CBC  Monitor for s/sx of bleeding F/u plans for oral anticoagulation after [place], PharmD, BCCCP Clinical Pharmacist  Pager: [phone] Phone: [phone] 05/01/2019 7:16 AM   ADDENDUM Patient underwent insertion of bilateral  pleurX catheters. Plan to restart heparin infusion 2 hours after the end of the procedure. Will resume previous rate of 1800 units/hr at 1530 and monitor heparin level 6 hours later. Monitor for s/sx of bleeding.  Antonietta Jewel, PharmD, [place] Clinical Pharmacist

## 2019-05-01 NOTE — Progress Notes (Signed)
Pre Procedure note for inpatients:   Erik Costa has been scheduled for Procedure(s): INSERTION PLEURAL DRAINAGE CATHETER (Bilateral) today. The various methods of treatment have been discussed with the patient. After consideration of the risks, benefits and treatment options the patient has consented to the planned procedure.   The patient has been seen and labs reviewed. There are no changes in the patient's condition to prevent proceeding with the planned procedure today.  Recent labs:  Lab Results  Component Value Date   WBC 3.9 (L) 05/01/2019   HGB 13.0 05/01/2019   HCT 37.9 (L) 05/01/2019   PLT 232 05/01/2019   GLUCOSE 116 (H) 05/01/2019   CHOL 206 (H) 04/26/2019   ALT 31 04/30/2019   AST 22 04/30/2019   NA 134 (L) 05/01/2019   K 3.8 05/01/2019   CL 93 (L) 05/01/2019   CREATININE 1.68 (H) 05/01/2019   BUN 42 (H) 05/01/2019   CO2 30 05/01/2019   INR 1.1 04/30/2019    Grace Isaac, MD 05/01/2019 12:00 PM

## 2019-05-02 ENCOUNTER — Encounter (HOSPITAL_COMMUNITY): Payer: Self-pay | Admitting: Cardiothoracic Surgery

## 2019-05-02 LAB — CBC
HCT: 34 % — ABNORMAL LOW (ref 39.0–52.0)
Hemoglobin: 11.8 g/dL — ABNORMAL LOW (ref 13.0–17.0)
MCH: 37.2 pg — ABNORMAL HIGH (ref 26.0–34.0)
MCHC: 34.7 g/dL (ref 30.0–36.0)
MCV: 107.3 fL — ABNORMAL HIGH (ref 80.0–100.0)
Platelets: 202 10*3/uL (ref 150–400)
RBC: 3.17 MIL/uL — ABNORMAL LOW (ref 4.22–5.81)
RDW: 14 % (ref 11.5–15.5)
WBC: 5.1 10*3/uL (ref 4.0–10.5)
nRBC: 0 % (ref 0.0–0.2)

## 2019-05-02 LAB — BASIC METABOLIC PANEL
Anion gap: 11 (ref 5–15)
BUN: 42 mg/dL — ABNORMAL HIGH (ref 6–20)
CO2: 28 mmol/L (ref 22–32)
Calcium: 8.4 mg/dL — ABNORMAL LOW (ref 8.9–10.3)
Chloride: 96 mmol/L — ABNORMAL LOW (ref 98–111)
Creatinine, Ser: 1.63 mg/dL — ABNORMAL HIGH (ref 0.61–1.24)
GFR calc Af Amer: 52 mL/min — ABNORMAL LOW (ref >60)
GFR calc non Af Amer: 45 mL/min — ABNORMAL LOW (ref >60)
Glucose, Bld: 152 mg/dL — ABNORMAL HIGH (ref 70–99)
Potassium: 4.2 mmol/L (ref 3.5–5.1)
Sodium: 135 mmol/L (ref 135–145)

## 2019-05-02 LAB — HEPARIN LEVEL (UNFRACTIONATED): Heparin Unfractionated: 0.67 IU/mL (ref 0.30–0.70)

## 2019-05-02 MED ORDER — OXYCODONE HCL 5 MG PO TABS
5.0000 mg | ORAL_TABLET | ORAL | Status: DC | PRN
  Administered 2019-05-02 – 2019-05-03 (×2): 5 mg via ORAL
  Filled 2019-05-02 (×2): qty 1

## 2019-05-02 MED ORDER — TRAMADOL HCL 50 MG PO TABS
50.0000 mg | ORAL_TABLET | Freq: Four times a day (QID) | ORAL | Status: DC
  Administered 2019-05-02 – 2019-05-03 (×5): 50 mg via ORAL
  Filled 2019-05-02 (×5): qty 1

## 2019-05-02 MED ORDER — MIDODRINE HCL 5 MG PO TABS
10.0000 mg | ORAL_TABLET | Freq: Three times a day (TID) | ORAL | Status: DC
  Administered 2019-05-02 – 2019-05-03 (×4): 10 mg via ORAL
  Filled 2019-05-02 (×4): qty 2

## 2019-05-02 MED ORDER — APIXABAN 5 MG PO TABS
5.0000 mg | ORAL_TABLET | Freq: Two times a day (BID) | ORAL | Status: DC
  Administered 2019-05-02 – 2019-05-03 (×3): 5 mg via ORAL
  Filled 2019-05-02 (×3): qty 1

## 2019-05-02 NOTE — Progress Notes (Addendum)
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  GOAL:   Patient will meet greater than or equal to 90% of their needs  MONITOR:   PO intake, Supplement acceptance, Labs, Skin, I & O's  ASSESSMENT:   58 yo male admitted after sudden syncopal episode, found to have pulmonary embolus. PMH of amyloidosis.  6/8-6/10: intubated 6/16: s/p bilateral pleurex tubes placed  **RD working remotely**  Patient has only had pudding so far this morning. Prior to procedure yesterday in which he was NPO for the majority of the day, the patient consumed 50-100% of meals on 6/15. Pt is not drinking Ensure supplements, will d/c. Will continue [employer].   Per weight records, weights have remained stable. Per I/O's: -2.7L since admit.  Medications: Vitamin D tablet daily, Multivitamin with minerals daily, Zinc sulfate capsule daily Labs reviewed.  Diet Order:   Diet Order            Diet 2 gram sodium Room service appropriate? Yes; Fluid consistency: Thin  Diet effective now              EDUCATION NEEDS:   Education needs have been addressed  Skin:  Skin Assessment: Reviewed RN Assessment(skin tear to left upper chest)  Last BM:  6/15  Height:   Ht Readings from Last 1 Encounters:  05/01/19 6' 2.02" (1.88 m)    Weight:   Wt Readings from Last 1 Encounters:  05/01/19 71.1 kg    Ideal Body Weight:  86.4 kg  BMI:  Body mass index is 20.11 kg/m.  Estimated Nutritional Needs:   Kcal:  1900-2100  Protein:  105-115g  Fluid:  2L/day  Clayton Bibles, MS, RD, LDN [place] Dietitian Pager: [phone] After Hours  Pager: [phone]

## 2019-05-02 NOTE — TOC Initial Note (Signed)
Transition of Care [facility]) - Initial/Assessment Note    Patient Details  Name: Erik Costa MRN: [id number] Date of Birth: 04/29/1961  Transition of Care [facility] CM/SW Contact:    Bethena Roys, RN Phone Number: 05/02/2019, 4:54 PM  Clinical Narrative:   Pt presented for Pulmonary embolus- plan for home with PleurX Drain. Pt in need of [place]- information faxed to [facility] no one to cover the case at this time. [facility] to call in am to have agency name and [place] date. ID Intake # [id number]. DME RW and 3n1 to be delivered to patients home. CM will continue to monitor.                Expected Discharge Plan: [place] Barriers to Discharge: No Barriers Identified   Patient Goals and CMS Choice     Choice offered to / list presented to : NA  Expected Discharge Plan and Services Expected Discharge Plan: [place] In-house Referral: NA Discharge Planning Services: CM Consult Post Acute Care Choice: Home Health, Durable Medical Equipment Living arrangements for the past 2 months: Single Family Home                 DME Arranged: 3-N-1, [place] rolling DME Agency: [place] Date DME Agency Contacted: 05/02/19 Time DME Agency Contacted: [phone] Representative spoke with at DME Agency: [place] with [facility].            Prior Living Arrangements/Services Living arrangements for the past 2 months: Single Family Home Lives with:: Spouse Patient language and need for interpreter reviewed:: Yes Do you feel safe going back to the place where you live?: Yes      Need for Family Participation in Patient Care: Yes (Comment) Care giver support system in place?: Yes (comment) Current home services: (N/A) Criminal Activity/Legal Involvement Pertinent to Current Situation/Hospitalization: No - Comment as needed  Activities of Daily Living   ADL Screening (condition at time of admission) Patient's cognitive ability adequate  to safely complete daily activities?: Yes Is the patient deaf or have difficulty hearing?: No Does the patient have difficulty seeing, even when wearing glasses/contacts?: No Does the patient have difficulty concentrating, remembering, or making decisions?: No Patient able to express need for assistance with ADLs?: Yes Does the patient have difficulty dressing or bathing?: No Independently performs ADLs?: Yes (appropriate for developmental age) Does the patient have difficulty walking or climbing stairs?: No Weakness of Legs: None Weakness of Arms/Hands: None  Permission Sought/Granted Permission sought to share information with : Family Supports Permission granted to share information with : Yes, Verbal Permission Granted     Permission granted to share info w AGENCY: [facility]        Emotional Assessment Appearance:: Appears stated age Attitude/Demeanor/Rapport: Engaged Affect (typically observed): Accepting Orientation: : Oriented to Self, Oriented to Place, Oriented to  Time, Oriented to Situation Alcohol / Substance Use: Not Applicable Psych Involvement: No (comment)  Admission diagnosis:  Acute respiratory failure with hypoxia ([place]) [J96.01] Patient Active Problem List   Diagnosis Date Noted  . Protein-calorie malnutrition, severe 04/26/2019  . Pulmonary embolus ([place]) 04/23/2019   PCP:  Azzie Roup, MD Pharmacy:   [facility] [place], [place] [address] [place] [address] Phone: [phone] Fax: [phone]     Social Determinants of Health (SDOH) Interventions    Readmission Risk Interventions No flowsheet  data found.

## 2019-05-02 NOTE — Progress Notes (Signed)
Progress Note  Patient Name: Erik Costa Date of Encounter: 05/02/2019  Primary Cardiologist: [place] bilateral Pleurex tubes on 6/16. With 1L off bilaterally. Breathing better. Still weak.    On po torsemide. Weight stable Creatinine improved at 1.6. SBP 70s on midodrine 5 tid   On heparin. No bleeding.   Inpatient Medications    Scheduled Meds: . aspirin EC  81 mg Oral Daily  . cholecalciferol  1,000 Units Oral Daily  . feeding supplement (ENSURE ENLIVE)  237 mL Oral BID BM  . Gerhardt's butt cream   Topical BID  . loratadine  10 mg Oral Daily  . mouth rinse  15 mL Mouth Rinse BID  . Melatonin  6 mg Oral QHS  . midodrine  5 mg Oral TID WC  . multivitamin with minerals  1 tablet Oral Q breakfast  . torsemide  40 mg Oral Daily  . zinc sulfate  220 mg Oral Daily   Continuous Infusions: . sodium chloride Stopped (04/25/19 0936)  . heparin 1,800 Units/hr (05/02/19 0549)  . lactated ringers 10 mL/hr at 05/01/19 1152   PRN Meds:    Vital Signs    Vitals:   05/02/19 0210 05/02/19 0332 05/02/19 0423 05/02/19 0840  BP: (!) 77/53 (!) 75/56  (!) 78/57  Pulse: 76 82  88  Resp: 19   16  Temp:   (!) (P) 97.4 F (36.3 C) (!) 97.4 F (36.3 C)  TempSrc:   (P) Oral Oral  SpO2: 99% 98%  100%  Weight:      Height:        Intake/Output Summary (Last 24 hours) at 05/02/2019 0848 Last data filed at 05/01/2019 2300 Gross per 24 hour  Intake 1000 ml  Output 3710 ml  Net -2710 ml   Last 3 Weights 05/01/2019 05/01/2019 04/29/2019  Weight (lbs) 156 lb 11.2 oz 156 lb 11.2 oz 156 lb 1.6 oz  Weight (kg) 71.079 kg 71.079 kg 70.806 kg      Telemetry    Sinus rhythm 80-90s. Personally reviewed   ECG    na - Personally Reviewed  Physical Exam   General:  Weak appearing. No resp difficulty HEENT: normal Neck: supple. JVP 7 Carotids 2+ bilat; no bruits. No lymphadenopathy or thryomegaly appreciated. Cor: PMI nondisplaced. Regular rate &  rhythm. 2/6 TR Lungs: clear decreased in bases + pleurex drains Abdomen: soft, nontender, nondistended. No hepatosplenomegaly. No bruits or masses. Good bowel sounds. Extremities: no cyanosis, clubbing, rash, 1+ edema + compression stocking Neuro: alert & orientedx3, cranial nerves grossly intact. moves all 4 extremities w/o difficulty. Affect pleasant    Labs    Chemistry Recent Labs  Lab 04/27/19 0422 04/28/19 0043  04/30/19 1458 05/01/19 0443 05/02/19 0338  NA 133* 135   < > 136 134* 135  K 3.6 4.0   < > 4.2 3.8 4.2  CL 93* 96*   < > 97* 93* 96*  CO2 30 29   < > 29 30 28   GLUCOSE 116* 115*   < > 121* 116* 152*  BUN 63* 62*   < > 45* 42* 42*  CREATININE 2.47* 2.33*   < > 1.83* 1.68* 1.63*  CALCIUM 8.4* 8.5*   < > 8.9 8.7* 8.4*  PROT 5.2* 5.1*  --  5.5*  --   --   ALBUMIN 1.8* 1.7*  --  1.8*  --   --   AST 18 20  --  22  --   --  ALT 31 29  --  31  --   --   ALKPHOS 81 79  --  93  --   --   BILITOT 0.9 1.0  --  1.0  --   --   GFRNONAA 27* 29*   < > 39* 43* 45*  GFRAA 32* 34*   < > 45* 50* 52*  ANIONGAP 10 10   < > 10 11 11    < > = values in this interval not displayed.     Hematology Recent Labs  Lab 04/30/19 1458 05/01/19 0443 05/02/19 0338  WBC 3.3* 3.9* 5.1  RBC 3.40* 3.49* 3.17*  HGB 12.9* 13.0 11.8*  HCT 36.8* 37.9* 34.0*  MCV 108.2* 108.6* 107.3*  MCH 37.9* 37.2* 37.2*  MCHC 35.1 34.3 34.7  RDW 14.4 14.2 14.0  PLT 234 232 202    Cardiac Enzymes No results for input(s): TROPONINI in the last 168 hours. No results for input(s): TROPIPOC in the last 168 hours.   BNP No results for input(s): BNP, PROBNP in the last 168 hours.   DDimer No results for input(s): DDIMER in the last 168 hours.   Radiology    X-ray Chest Pa Or Ap  Result Date: 05/01/2019 CLINICAL DATA:  Pleural effusion EXAM: CHEST  1 VIEW COMPARISON:  05/01/2019 FINDINGS: The patient has undergone placement of bilateral PleurX drainage catheters. There is subcutaneous gas along the  flanks. There is a small left apical pneumothorax. No definite evidence of a right-sided pneumothorax. There are persistent small to moderate-sized bilateral pleural effusions. The heart size is stable but enlarged. There is a dense retrocardiac opacity which is favored to represent atelectasis. There is generalized volume overload bilaterally. IMPRESSION: 1. Status post placement of bilateral PleurX drainage catheters. There is a small left-sided pneumothorax. No definite right-sided pneumothorax. 2. Persistent small to moderate-sized bilateral pleural effusions. 3. Retrocardiac opacity favored to represent compressive atelectasis. Electronically Signed   By: Constance Holster M.D.   On: 05/01/2019 16:55   Dg Chest 2 View  Result Date: 05/01/2019 CLINICAL DATA:  Preop for pleural drain insertion. EXAM: CHEST - 2 VIEW COMPARISON:  One-view chest x-ray 04/29/2019. CT of the chest 04/23/2019. FINDINGS: Heart size is upper limits of normal. Bilateral pleural effusions and basilar airspace disease is similar the prior study. There is moderate diffuse edema. Visualized soft tissues and bony thorax are unremarkable. IMPRESSION: 1. Similar appearance of bilateral pleural effusions and basilar airspace disease. This likely reflects atelectasis, infection is not excluded. 2. Borderline cardiomegaly with stable moderate edema. Electronically Signed   By: [place] M.D.   On: 05/01/2019 10:20   Dg C-arm 1-60 Min-no Report  Result Date: 05/01/2019 Fluoroscopy was utilized by the requesting physician.  No radiographic interpretation.    Patient Profile      Erik Costa is a 58 y.o. 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  Assessment & Plan    1. Acute hypoxic respiratory failure - likely multifactorial related  to PE, HF, and bilateral pleural effusions - 04/2019 echo LVEF 45-50%, cannot eval diastolic function, moderate RV dysfunction. Severe LVH - now improved. - Volume status looks good. Continue current dose of torsemide - Now s/p bilateral Pleurex tubes. Needs education on draining   2. Pleural effusions, recurrent - s/p thoracentesis  right side 6/11, left side 6/12 - Now s/p bilateral Pleurex tubes. Needs education on draining  3. 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 Continue current dose of torsemide  4. Bilateral PE - on hep gtt, can convert to DOAC today. Given that he has been on IV heparin for > 7 days will not use loading dose. Start apixaban 5 bid. Discussed dosing with PharmD personally.  5. Hypotension - off pressors, has been on midodrine. SBP in 70s. Increase midodrine to 10 tid  6. VT - occurred in setting of hypoxia, respiratory distress - had been on amiodarone, now off  7. Multiple myeloma with AL amyloidosis with heart, kidney and positive fat pad involvement (dx in 2019) - Treated by hematology/oncology with cyclophosphamide,Velcade, dexamethasone  Stable for d/c from cardiac perspective on current regimen. Will need f/u with Dr. Cheryl Flash @ [facility]. Will nee HHRN and teaching on how to drain Pleurex.   Home on   Torsemide 40  Midodrine 10 tid Apixaban 5 bid stop ASA   For questions or updates, please contact [place] Please consult [email or web address] for contact info under        Signed, Glori Bickers, MD   05/02/2019, 8:48 AM

## 2019-05-02 NOTE — Progress Notes (Signed)
ANTICOAGULATION CONSULT NOTE   Pharmacy Consult for Heparin Indication: pulmonary embolus  No Known Allergies  Patient Measurements: Height: 6' 2.02" (188 cm) Weight: 156 lb 11.2 oz (71.1 kg) IBW/kg (Calculated) : 82.24  Heparin dosing weight: 70.6kg  Vital Signs: Temp: (P) 97.4 F (36.3 C) (06/17 0423) Temp Source: (P) Oral (06/17 0423) BP: 75/56 (06/17 0332) Pulse Rate: 82 (06/17 0332)  Labs: Recent Labs    04/30/19 1458 05/01/19 0443 05/01/19 2133 05/02/19 0338  HGB 12.9* 13.0  --  11.8*  HCT 36.8* 37.9*  --  34.0*  PLT 234 232  --  202  APTT 104*  --   --   --   LABPROT 14.0  --   --   --   INR 1.1  --   --   --   HEPARINUNFRC  --  1.02* 0.50 0.67  CREATININE 1.83* 1.68*  --  1.63*    Estimated Creatinine Clearance: 48.5 mL/min (A) (by C-G formula based on SCr of 1.63 mg/dL (H)).   Medical History: History reviewed. No pertinent past medical history.  Assessment: Erik Costa is a 58yo male admitted with pulmonary embolism. Pharmacy consulted to start heparin infusion. Chest CT: Acute pulmonary embolus involving RUL and RLL. Clot burden is small to moderate. Positive for acute PE with CT evidence of right heart strain. RV/LV Ratio = 1.1. Reviewed outpatient notes from [facility] and patient not on prior anticoagulation. Patient s/p thoracentesis 6/12 - and new accumulation so probable pleurx this week.  S/p insertion of bilateral pleur X catheters, heparin restarted.  Heparin level therapeutic at 0.67 this morning, hgb down slightly to 11.8, plt count ok at 202. No bleeding issues noted.   Goal of Therapy:  Heparin level 0.3-0.7 units/ml Monitor platelets by anticoagulation protocol: Yes   Plan:  Continue heparin at current rate Check daily heparin level and CBC  Monitor for s/sx of bleeding F/u plans for oral anticoagulation after OR  Erin Hearing PharmD., BCPS Clinical Pharmacist 05/02/2019 7:40 AM

## 2019-05-02 NOTE — Progress Notes (Addendum)
[address]       [place] [address]             [phone]      1 Day Post-Op Procedure(s) (LRB): INSERTION BILATERAL PLEURAL DRAINAGE CATHETER (Bilateral) Subjective: In pain since he had the left pleurx drained. Pain medicine is helping.   Objective: Vital signs in last 24 hours: Temp:  [96.6 F (35.9 C)-98 F (36.7 C)] 98 F (36.7 C) (06/17 1149) Pulse Rate:  [76-92] 92 (06/17 1245) Cardiac Rhythm: Normal sinus rhythm (06/17 0840) Resp:  [7-19] 18 (06/17 1149) BP: (75-95)/(53-71) 85/69 (06/17 1246) SpO2:  [96 %-100 %] 100 % (06/17 1245)     Intake/Output from previous day: 06/16 0701 - 06/17 0700 In: 1154.5 [I.V.:1154.5] Out: 5872 [Urine:1700; Blood:10] Intake/Output this shift: Total I/O In: 120 [P.O.:120] Out: 950 [Chest Tube:950]  General appearance: alert, cooperative and mild distress Heart: regular rate and rhythm, S1, S2 normal, no murmur, click, rub or gallop Lungs: clear to auscultation bilaterally and diminished in the right lower lobe Abdomen: soft, non-tender; bowel sounds normal; no masses,  no organomegaly Extremities: extremities normal, atraumatic, no cyanosis or edema Wound: clean and dry  Lab Results: Recent Labs    05/01/19 0443 05/02/19 0338  WBC 3.9* 5.1  HGB 13.0 11.8*  HCT 37.9* 34.0*  PLT 232 202   BMET:  Recent Labs    05/01/19 0443 05/02/19 0338  NA 134* 135  K 3.8 4.2  CL 93* 96*  CO2 30 28  GLUCOSE 116* 152*  BUN 42* 42*  CREATININE 1.68* 1.63*  CALCIUM 8.7* 8.4*    PT/INR:  Recent Labs    04/30/19 1458  LABPROT 14.0  INR 1.1   ABG    Component Value Date/Time   PHART 7.672 (HH) 04/23/2019 2048   HCO3 34.0 (H) 04/23/2019 2048   TCO2 35 (H) 04/23/2019 2048   O2SAT 57.0 05/01/2019 0345   CBG (last 3)  No results for input(s): GLUCAP in the last 72 hours.  Assessment/Plan: S/P Procedure(s) (LRB): INSERTION BILATERAL PLEURAL DRAINAGE CATHETER (Bilateral)  1. Left pleurx  drained this morning but significant pain experienced by the patient. Toradol ordered without relief. Oxycodone ordered and the patient is feeling a little better this afternoon. He is now willing to let nursing drain the right pleurx catheter. Continue daily draining until < 150cc in one side. 950cc out on the  2. Pulmonary embolism-Switching to Eliquis today  3. Acute on chronic heart failure-Continue torsemide. HF following. Elevate legs and compression stockings on. 4. Acute respiratory failure with hypoxia-weaning to room air as toelrated 5. Multiple myeloma/Amloidosis-cardiac and renal involvement. Poor prognosis 6. Malnutrition   Plan: Medical care per primary. Plan on discharging tomorrow with home health for bilateral pleurx catheter drainage.    LOS: 9 days    Erik Costa 05/02/2019 Continue drainage both sides daily for now, see orders  I have seen and examined Erik Costa and agree with the above assessment  and plan.  Erik Isaac MD Beeper [phone] Office [phone] 05/02/2019 2:12 PM

## 2019-05-02 NOTE — Evaluation (Signed)
Physical Therapy Evaluation Patient Details Name: Erik Costa MRN: [id number] DOB: [birth year] Today's Date: 05/02/2019   History of Present Illness  57 y.o. male with a history of heart failure, amyloidosis, recurrent pleural effusions, multiple myeloma. He presented secondary to sudden collapse and found to have a pulmonary embolism. Course complicated by respiratory and circulatory failure requiring intubation 6/8-6/10 and vasopressors. With re-accumulating bilateral pleural effusions.  Clinical Impression  PTA pt living with wife and son in single story home, independent in ambulation and iADLs. Pt currently limited in safe mobility by decreased strength, balance and endurance. Pt is supervision for bed mobility, and min guard for transfers. Pt ambulated pushing IV pole with min guard progressing to minA for stability. Pt feels he would do better with RW as he feels "woozy". Pt ambulated on RA and was able to maintain SaO2 >92%O2 throughout session. PT recommending HHPT level rehab at d/c to improve strength and balance to be able to return to independent PLOF. PT will continue to follow acutely.    Follow Up Recommendations Home health PT;Supervision for mobility/OOB    Equipment Recommendations  Rolling walker with 5" wheels;3in1 (PT)       Precautions / Restrictions Precautions Precautions: None Restrictions Weight Bearing Restrictions: No      Mobility  Bed Mobility Overal bed mobility: Needs Assistance Bed Mobility: Supine to Sit     Supine to sit: Supervision     General bed mobility comments: supervision for safety, HoB elevated, use of hand rails and increased time and effort  Transfers Overall transfer level: Needs assistance Equipment used: None Transfers: Sit to/from Stand Sit to Stand: Min guard         General transfer comment: min guard for safety, hx of orthostatics, no dizziness or lightheadedness with standing, good power up and  steadying  Ambulation/Gait Ambulation/Gait assistance: Min guard;Min assist;+2 safety/equipment Gait Distance (Feet): 350 Feet Assistive device: IV Pole Gait Pattern/deviations: Step-through pattern;Narrow base of support;Drifts right/left Gait velocity: slowed Gait velocity interpretation: >2.62 ft/sec, indicative of community ambulatory General Gait Details: min guard progressing to minA for steadying, no overt LoB, mild instability, pt feels more secure pushing IV pole and ambulates with decreased BoS and slight drifting with ambulation          Balance Overall balance assessment: Needs assistance Sitting-balance support: Feet supported;No upper extremity supported Sitting balance-Leahy Scale: Good     Standing balance support: No upper extremity supported;During functional activity Standing balance-Leahy Scale: Fair                               Pertinent Vitals/Pain Pain Assessment: No/denies pain    Home Living Family/patient expects to be discharged to:: Private residence Living Arrangements: Spouse/significant other;Children Available Help at Discharge: Family;Available 24 hours/day Type of Home: House Home Access: Level entry     Home Layout: Multi-level Home Equipment: None      Prior Function Level of Independence: Independent         Comments: Pt reports he has not been driving so his wife drives him.        Extremity/Trunk Assessment   Upper Extremity Assessment Upper Extremity Assessment: Defer to OT evaluation    Lower Extremity Assessment Lower Extremity Assessment: Generalized weakness       Communication   Communication: No difficulties  Cognition Arousal/Alertness: Awake/alert Behavior During Therapy: WFL for tasks assessed/performed Overall Cognitive Status: Within Functional Limits for tasks assessed  General Comments General comments (skin integrity, edema,  etc.): VSS, Pt on 2L O2 via [place] on entry with SaO2 98%O2. pt ambulated on RA and was able to maintain SaO2 >92%O2 throughout session, pt left on RA in seated SaO2 100%O2        Assessment/Plan    PT Assessment Patient needs continued PT services  PT Problem List Decreased strength;Decreased activity tolerance;Decreased balance;Decreased mobility       PT Treatment Interventions DME instruction;Gait training;Stair training;Functional mobility training;Therapeutic activities;Therapeutic exercise;Balance training;Cognitive remediation;Patient/family education    PT Goals (Current goals can be found in the Care Plan section)  Acute Rehab PT Goals Patient Stated Goal: be steadier with mobility PT Goal Formulation: With patient Time For Goal Achievement: 05/16/19 Potential to Achieve Goals: Good    Frequency Min 3X/week    AM-PAC PT "6 Clicks" Mobility  Outcome Measure Help needed turning from your back to your side while in a flat bed without using bedrails?: None Help needed moving from lying on your back to sitting on the side of a flat bed without using bedrails?: None Help needed moving to and from a bed to a chair (including a wheelchair)?: None Help needed standing up from a chair using your arms (e.g., wheelchair or bedside chair)?: None Help needed to walk in hospital room?: None Help needed climbing 3-5 steps with a railing? : A Little 6 Click Score: 23    End of Session Equipment Utilized During Treatment: Gait belt Activity Tolerance: Patient limited by fatigue Patient left: in chair;with call bell/phone within reach Nurse Communication: Mobility status PT Visit Diagnosis: Unsteadiness on feet (R26.81);Other abnormalities of gait and mobility (R26.89);Muscle weakness (generalized) (M62.81);Difficulty in walking, not elsewhere classified (R26.2)    Time: 4825-0037 PT Time Calculation (min) (ACUTE ONLY): 33 min   Charges:   PT Evaluation $PT Eval Moderate Complexity:  1 Mod          Erik Costa B. Erik Costa PT, DPT Acute Rehabilitation Services Pager [phone] Office [phone]   [place] 05/02/2019, 10:34 AM

## 2019-05-02 NOTE — Progress Notes (Signed)
PROGRESS NOTE    Erik Costa  VHQ:[id number] DOB: 1961-09-05 DOA: 04/23/2019 PCP: Azzie Roup, MD   Brief Narrative: Erik Costa is a 58 y.o. male with a history of heart failure, amyloidosis, recurrent pleural effusions, multiple myeloma. He presented secondary to sudden collapse and found to have a pulmonary embolism. Course complicated by respiratory and circulatory failure requiring intubation and vasopressors. Cardiology consulted for heart failure.   Assessment & Plan:   Active Problems:   Pulmonary embolus (HCC)   Protein-calorie malnutrition, severe  Pulmonary embolism No history. Associated right heart strain on CT. Started on heparin drip -Plan to switch to Eliquis most likely today  Acute on chronic diastolic heart failure Transthoracic Echocardiogram with an EF of 45-50%. Previously diuresed with Lasix IV which has been stopped secondary to worsening AKI in addition to worsening hypotension. Restarted on torsemide Cardiology/heart failure team on board -Cardiology recommendations: torsemide -Elevate legs; stockings ordered  Acute respiratory failure with hypoxia Pulmonary edema Intubated from 6/8 to 6/10. Patient is s/p bilateral thoracentesis. Difficult to manage with diuresis secondary to hypotension. Bilateral Pleurx catheters placed on 6/16. -Wean to room air  Hypotension Chronic and worsened. Was previously on vasopressors but weaned off. Goal MAP of >60 per cardiology. Midodrine initiated. Complicating diuresis attempts. Improved. Still low with stable MAP.  Amyloidosis Multiple myeloma Cardiac and renal involvement. On cyclophosphamide and Velcade therapy. Poor prognosis. [facility] with [facility].  Ventricular tachycardia Per chart review, secondary to hypoxia and respiratory distress. Was treated with amiodarone which has now been discontinued.  Somnolence Resolved. Secondary to poor sleep.  Malnutrition, severe Secondary to chronic  disease   DVT prophylaxis: Heparin drip Code Status:   Code Status: Full Code Family Communication: None Disposition Plan: Discharge pending cardiology recommendations, possibly in 24 hours. PT/OT eval pending   Consultants:   PCCM  Cardiology  TCTS  Procedures:   6/8-6/10: ETT  6/9: Transthoracic Echocardiogram IMPRESSIONS    1. The left ventricle has mildly reduced systolic function, with an ejection fraction of 45-50%. The cavity size was normal. There is severe concentric left ventricular hypertrophy. Left ventricular diastolic function could not be evaluated due to  indeterminate diastolic function. Elevated left ventricular end-diastolic pressure No evidence of left ventricular regional wall motion abnormalities.  2. The right ventricle has moderately reduced systolic function. The cavity was normal. There is no increase in right ventricular wall thickness. Right ventricular systolic pressure could not be assessed.  3. Right atriu is severely dilated. There is significant spontaneous echo contrast in the RA with no obvious thrombus.  4. Large pleural effusion in the left lateral region.  5. The pericardial effusion is posterior to the left ventricle.  6. Trivial pericardial effusion is present.  7. The inferior vena cava was dilated in size with <50% respiratory variability.  8. Given severe LVH, increased filling pressures and speckeled appearance of myocardium in addition to trace pericardial effusion, consider cardiac MRI to rulw out amyloidosis.  Antimicrobials:  None    Subjective: Dyspnea is improved. Some mild pain around incision sites, but otherwise feels well.  Objective: Vitals:   05/02/19 0210 05/02/19 0332 05/02/19 0423 05/02/19 0840  BP: (!) 77/53 (!) 75/56  (!) 78/57  Pulse: 76 82  88  Resp: 19   16  Temp:   (!) (P) 97.4 F (36.3 C) (!) 97.4 F (36.3 C)  TempSrc:   (P) Oral Oral  SpO2: 99% 98%  100%  Weight:      Height:  Intake/Output Summary (Last 24 hours) at 05/02/2019 1005 Last data filed at 05/01/2019 2300 Gross per 24 hour  Intake 1000 ml  Output 3460 ml  Net -2460 ml   Filed Weights   04/29/19 1200 05/01/19 0405 05/01/19 1144  Weight: 70.8 kg 71.1 kg 71.1 kg    Examination: General exam: Appears calm and comfortable, thin appearing Respiratory system: Rales at bases, sounds clearer. Respiratory effort normal. Cardiovascular system: S1 & S2 heard, RRR. No murmurs, rubs, gallops or clicks. Gastrointestinal system: Abdomen is nondistended, soft and nontender. No organomegaly or masses felt. Normal bowel sounds heard. Central nervous system: Alert and oriented. No focal neurological deficits. Extremities: No edema. No calf tenderness Skin: No cyanosis. Multiple areas of ecchymosis Psychiatry: Judgement and insight appear normal. Mood & affect appropriate.       Data Reviewed: I have personally reviewed following labs and imaging studies  CBC: Recent Labs  Lab 04/26/19 0522  04/29/19 0702 04/30/19 0909 04/30/19 1458 05/01/19 0443 05/02/19 0338  WBC 3.7*   < > 4.4 3.9* 3.3* 3.9* 5.1  NEUTROABS 3.3  --   --   --   --   --   --   HGB 12.1*   < > 12.1* 12.7* 12.9* 13.0 11.8*  HCT 34.9*   < > 35.1* 36.7* 36.8* 37.9* 34.0*  MCV 111.1*   < > 108.3* 108.6* 108.2* 108.6* 107.3*  PLT 189   < > 216 230 234 232 202   < > = values in this interval not displayed.   Basic Metabolic Panel: Recent Labs  Lab 04/26/19 0522 04/27/19 0422 04/28/19 0043 04/29/19 0750 04/30/19 0909 04/30/19 1458 05/01/19 0443 05/02/19 0338  NA 136 133* 135 133* 133* 136 134* 135  K 4.2 3.6 4.0 4.0 3.7 4.2 3.8 4.2  CL 94* 93* 96* 94* 95* 97* 93* 96*  CO2 29 30 29 28 27 29 30 28   GLUCOSE 99 116* 115* 100* 125* 121* 116* 152*  BUN 53* 63* 62* 49* 47* 45* 42* 42*  CREATININE 2.26* 2.47* 2.33* 1.95* 1.78* 1.83* 1.68* 1.63*  CALCIUM 8.7* 8.4* 8.5* 8.5* 8.6* 8.9 8.7* 8.4*  MG 2.1 2.1 2.1  --   --   --   --   --    PHOS 5.4* 4.3 3.5  --   --   --   --   --    GFR: Estimated Creatinine Clearance: 48.5 mL/min (A) (by C-G formula based on SCr of 1.63 mg/dL (H)). Liver Function Tests: Recent Labs  Lab 04/26/19 0522 04/26/19 1331 04/27/19 0422 04/28/19 0043 04/30/19 1458  AST 24  --  18 20 22   ALT 34  --  31 29 31   ALKPHOS 92  --  81 79 93  BILITOT 0.9  --  0.9 1.0 1.0  PROT 5.3* 5.3* 5.2* 5.1* 5.5*  ALBUMIN 1.9*  --  1.8* 1.7* 1.8*   No results for input(s): LIPASE, AMYLASE in the last 168 hours. No results for input(s): AMMONIA in the last 168 hours. Coagulation Profile: Recent Labs  Lab 04/30/19 1458  INR 1.1   Cardiac Enzymes: No results for input(s): CKTOTAL, CKMB, CKMBINDEX, TROPONINI in the last 168 hours. BNP (last 3 results) No results for input(s): PROBNP in the last 8760 hours. HbA1C: No results for input(s): HGBA1C in the last 72 hours. CBG: Recent Labs  Lab 04/26/19 1338 04/27/19 0732 04/27/19 1135 04/27/19 1614 04/27/19 1919  GLUCAP 148* 131* 164* 109* 125*   Lipid Profile: No  results for input(s): CHOL, HDL, LDLCALC, TRIG, CHOLHDL, LDLDIRECT in the last 72 hours. Thyroid Function Tests: No results for input(s): TSH, T4TOTAL, FREET4, T3FREE, THYROIDAB in the last 72 hours. Anemia Panel: No results for input(s): VITAMINB12, FOLATE, FERRITIN, TIBC, IRON, RETICCTPCT in the last 72 hours. Sepsis Labs: No results for input(s): PROCALCITON, LATICACIDVEN in the last 168 hours.  Recent Results (from the past 240 hour(s))  Culture, blood (routine x 2)     Status: None   Collection Time: 04/23/19  3:41 PM   Specimen: BLOOD  Result Value Ref Range Status   Specimen Description BLOOD SITE NOT SPECIFIED  Final   Special Requests   Final    BOTTLES DRAWN AEROBIC ONLY Blood Culture results may not be optimal due to an inadequate volume of blood received in culture bottles   Culture   Final    NO GROWTH 5 DAYS Performed at [facility], [place] [address],  [place], [place] [address]    Report Status 04/28/2019 FINAL  Final  Culture, blood (routine x 2)     Status: None   Collection Time: 04/23/19  4:34 PM   Specimen: BLOOD  Result Value Ref Range Status   Specimen Description BLOOD RIGHT ANTECUBITAL  Final   Special Requests   Final    BOTTLES DRAWN AEROBIC AND ANAEROBIC Blood Culture adequate volume   Culture   Final    NO GROWTH 5 DAYS Performed at [facility], [place] [address] [place], [place] [address]    Report Status 04/28/2019 FINAL  Final  SARS Coronavirus 2 (CEPHEID- Performed in [place] [facility] lab), Hosp Order     Status: None   Collection Time: 04/23/19  5:06 PM   Specimen: Nasopharyngeal Swab  Result Value Ref Range Status   SARS Coronavirus 2 NEGATIVE NEGATIVE Final    Comment: (NOTE) If result is NEGATIVE SARS-CoV-2 target nucleic acids are NOT DETECTED. The SARS-CoV-2 RNA is generally detectable in upper and lower  respiratory specimens during the acute phase of infection. The lowest  concentration of SARS-CoV-2 viral copies this assay can detect is 250  copies / mL. A negative result does not preclude SARS-CoV-2 infection  and should not be used as the sole basis for treatment or other  patient management decisions.  A negative result may occur with  improper specimen collection / handling, submission of specimen other  than nasopharyngeal swab, presence of viral mutation(s) within the  areas targeted by this assay, and inadequate number of viral copies  (<250 copies / mL). A negative result must be combined with clinical  observations, patient history, and epidemiological information. If result is POSITIVE SARS-CoV-2 target nucleic acids are DETECTED. The SARS-CoV-2 RNA is generally detectable in upper and lower  respiratory specimens dur ing the acute phase of infection.  Positive  results are indicative of active infection with SARS-CoV-2.  Clinical  correlation with patient history and other diagnostic  information is  necessary to determine patient infection status.  Positive results do  not rule out bacterial infection or co-infection with other viruses. If result is PRESUMPTIVE POSTIVE SARS-CoV-2 nucleic acids MAY BE PRESENT.   A presumptive positive result was obtained on the submitted specimen  and confirmed on repeat testing.  While 2019 novel coronavirus  (SARS-CoV-2) nucleic acids may be present in the submitted sample  additional confirmatory testing may be necessary for epidemiological  and / or clinical management purposes  to differentiate between  SARS-CoV-2 and other Sarbecovirus  currently known to infect humans.  If clinically indicated additional testing with an alternate test  methodology [phone]) is advised. The SARS-CoV-2 RNA is generally  detectable in upper and lower respiratory sp ecimens during the acute  phase of infection. The expected result is Negative. Fact Sheet for Patients:  StrictlyIdeas.no Fact Sheet for Healthcare Providers: BankingDealers.co.za This test is not yet approved or cleared by the [place] FDA and has been authorized for detection and/or diagnosis of SARS-CoV-2 by FDA under an Emergency Use Authorization (EUA).  This EUA will remain in effect (meaning this test can be used) for the duration of the COVID-19 declaration under Section 564(b)(1) of the Act, 21 U.S.C. section 360bbb-3(b)(1), unless the authorization is terminated or revoked sooner. Performed at [facility], [place] [address] [place], [place] [address]   MRSA PCR Screening     Status: None   Collection Time: 04/23/19 11:00 PM   Specimen: Nasopharyngeal  Result Value Ref Range Status   MRSA by PCR NEGATIVE NEGATIVE Final    Comment:        The GeneXpert MRSA Assay (FDA approved for NASAL specimens only), is one component of a comprehensive MRSA colonization surveillance program. It is not intended to diagnose  MRSA infection nor to guide or monitor treatment for MRSA infections. Performed at [facility], [place] [address], [place], [place] [address]   Body fluid culture     Status: None   Collection Time: 04/26/19  1:10 PM   Specimen: Pleura; Body Fluid  Result Value Ref Range Status   Specimen Description PLEURAL  Final   Special Requests FLUID  Final   Gram Stain   Final    RARE WBC PRESENT,BOTH PMN AND MONONUCLEAR NO ORGANISMS SEEN    Culture   Final    NO GROWTH Performed at [facility], [address], [place], [place] [address]    Report Status 04/29/2019 FINAL  Final  Surgical pcr screen     Status: None   Collection Time: 04/30/19 12:37 PM   Specimen: Nasal Mucosa; Nasal Swab  Result Value Ref Range Status   MRSA, PCR NEGATIVE NEGATIVE Final   Staphylococcus aureus NEGATIVE NEGATIVE Final    Comment: (NOTE) The Xpert SA Assay (FDA approved for NASAL specimens in patients 37 years of age and older), is one component of a comprehensive surveillance program. It is not intended to diagnose infection nor to guide or monitor treatment. Performed at [facility], [place] [address], [place], [place] [address]          Radiology Studies: X-ray Chest Pa Or Ap  Result Date: 05/01/2019 CLINICAL DATA:  Pleural effusion EXAM: CHEST  1 VIEW COMPARISON:  05/01/2019 FINDINGS: The patient has undergone placement of bilateral PleurX drainage catheters. There is subcutaneous gas along the flanks. There is a small left apical pneumothorax. No definite evidence of a right-sided pneumothorax. There are persistent small to moderate-sized bilateral pleural effusions. The heart size is stable but enlarged. There is a dense retrocardiac opacity which is favored to represent atelectasis. There is generalized volume overload bilaterally. IMPRESSION: 1. Status post placement of bilateral PleurX drainage catheters. There is a small left-sided pneumothorax. No definite right-sided  pneumothorax. 2. Persistent small to moderate-sized bilateral pleural effusions. 3. Retrocardiac opacity favored to represent compressive atelectasis. Electronically Signed   By: Constance Holster M.D.   On: 05/01/2019 16:55   Dg Chest 2 View  Result Date: 05/01/2019 CLINICAL DATA:  Preop  for pleural drain insertion. EXAM: CHEST - 2 VIEW COMPARISON:  One-view chest x-ray 04/29/2019. CT of the chest 04/23/2019. FINDINGS: Heart size is upper limits of normal. Bilateral pleural effusions and basilar airspace disease is similar the prior study. There is moderate diffuse edema. Visualized soft tissues and bony thorax are unremarkable. IMPRESSION: 1. Similar appearance of bilateral pleural effusions and basilar airspace disease. This likely reflects atelectasis, infection is not excluded. 2. Borderline cardiomegaly with stable moderate edema. Electronically Signed   By: [place] Morelle M.D.   On: 05/01/2019 10:20   Dg C-arm 1-60 Min-no Report  Result Date: 05/01/2019 Fluoroscopy was utilized by the requesting physician.  No radiographic interpretation.        Scheduled Meds:  aspirin EC  81 mg Oral Daily   cholecalciferol  1,000 Units Oral Daily   feeding supplement (ENSURE ENLIVE)  237 mL Oral BID BM   Gerhardt's butt cream   Topical BID   loratadine  10 mg Oral Daily   mouth rinse  15 mL Mouth Rinse BID   Melatonin  6 mg Oral QHS   midodrine  10 mg Oral TID WC   multivitamin with minerals  1 tablet Oral Q breakfast   torsemide  40 mg Oral Daily   zinc sulfate  220 mg Oral Daily   Continuous Infusions:  sodium chloride Stopped (04/25/19 0936)   heparin 1,800 Units/hr (05/02/19 0549)   lactated ringers 10 mL/hr at 05/01/19 1152     LOS: 9 days     Cordelia Poche, MD [facility] 05/02/2019, 10:05 AM  If 7PM-7AM, please contact night-coverage [email or web address]

## 2019-05-02 NOTE — Progress Notes (Signed)
Occupational Therapy Evaluation Patient Details Name: Erik Costa MRN: [id number] DOB: [birth year] Today's Date: 05/02/2019    History of Present Illness 58 y.o. male with a history of heart failure, amyloidosis, recurrent pleural effusions, multiple myeloma. He presented secondary to sudden collapse and found to have a pulmonary embolism. Course complicated by respiratory and circulatory failure requiring intubation 6/8-6/10 and vasopressors. Pt with re-accumulating bilateral pleural effusions.   Clinical Impression   Pt admitted due to events listed above. PTA pt lives at home with wife and son and is independent with ADLs and functional mobility. Pt requiring supervision-min guard for ADLs and min guard-min assist for mobility tasks (+2 for safety with lines). Pt reports he feels "woozy" and does not feel steady (pushing IV pole). Pt SpO2 >92% on RA throughout session.  Therapist discussed recommendation for 3n1 as use for over toilet and as shower chair, and pt in agreement with recommendation.  Recommending [facility] for discharge planning to further progress rehab. Will continue to follow acutely to maximize safety and independence with ADLs.    Follow Up Recommendations  Home health OT;Supervision/Assistance - 24 hour    Equipment Recommendations  3 in 1 bedside commode(RW)    Recommendations for Other Services       Precautions / Restrictions Precautions Precautions: None Restrictions Weight Bearing Restrictions: No      Mobility Bed Mobility Overal bed mobility: Needs Assistance Bed Mobility: Supine to Sit     Supine to sit: Supervision     General bed mobility comments: supervision for safety, HoB elevated, use of hand rails and increased time and effort  Transfers Overall transfer level: Needs assistance Equipment used: None Transfers: Sit to/from Stand Sit to Stand: Min guard         General transfer comment: min guard for safety, hx of orthostatics, no  dizziness or lightheadedness with standing, good power up and steadying    Balance Overall balance assessment: Needs assistance Sitting-balance support: Feet supported;No upper extremity supported Sitting balance-Leahy Scale: Good     Standing balance support: No upper extremity supported;During functional activity Standing balance-Leahy Scale: Fair                             ADL either performed or assessed with clinical judgement   ADL Overall ADL's : Needs assistance/impaired Eating/Feeding: Independent;Sitting   Grooming: Wash/dry hands;Wash/dry face;Min guard;Standing   Upper Body Bathing: Supervision/ safety;Sitting   Lower Body Bathing: Min guard;Sit to/from stand   Upper Body Dressing : Supervision/safety;Sitting   Lower Body Dressing: Min guard;Sit to/from stand Lower Body Dressing Details (indicate cue type and reason): Dons/doff socks with figure four pattern sitting EOB. Toilet Transfer: +2 for safety/equipment;Min guard;Ambulation   Toileting- Clothing Manipulation and Hygiene: Min guard;Sit to/from stand Toileting - Clothing Manipulation Details (indicate cue type and reason): using urinal     Functional mobility during ADLs: +2 for safety/equipment;Min guard;Minimal assistance General ADL Comments: Pt ambulates in room with +2 min guard for safety with lines/equipment. Pt ambulates without device in room but reports he feels unsteady. Pt prefers to hold onto IV pole when ambulating further.      Vision Baseline Vision/History: Wears glasses Patient Visual Report: No change from baseline       Perception     Praxis      Pertinent Vitals/Pain Pain Assessment: No/denies pain     Hand Dominance     Extremity/Trunk Assessment Upper Extremity Assessment Upper Extremity Assessment: Generalized  weakness   Lower Extremity Assessment Lower Extremity Assessment: Defer to PT evaluation       Communication Communication Communication: (HOH  in left ear)   Cognition Arousal/Alertness: Awake/alert Behavior During Therapy: WFL for tasks assessed/performed Overall Cognitive Status: Within Functional Limits for tasks assessed                                     General Comments  VSS, Pt on 2L O2 via [place] on entry with SaO2 98%O2. pt ambulated on RA and was able to maintain SaO2 >92%O2 throughout session, pt left on RA in seated SaO2 100%O2. BP in supine with HOB elevated 87/56 at start of session.    Exercises     Shoulder Instructions      Home Living Family/patient expects to be discharged to:: Private residence Living Arrangements: Spouse/significant other;Children Available Help at Discharge: Family;Available 24 hours/day Type of Home: House Home Access: Level entry     Home Layout: Multi-level Alternate Level Stairs-Number of Steps: 2   Bathroom Shower/Tub: Occupational psychologist: Standard     Home Equipment: None          Prior Functioning/Environment Level of Independence: Independent        Comments: Pt reports he has not been driving so his wife drives him.        OT Problem List: Decreased strength;Decreased activity tolerance;Impaired balance (sitting and/or standing);Decreased knowledge of use of DME or AE      OT Treatment/Interventions: Self-care/ADL training;DME and/or AE instruction;Therapeutic activities;Patient/family education;Balance training    OT Goals(Current goals can be found in the care plan section) Acute Rehab OT Goals Patient Stated Goal: be steadier with mobility OT Goal Formulation: With patient Time For Goal Achievement: 05/16/19 Potential to Achieve Goals: Good  OT Frequency: Min 2X/week   Barriers to D/C:            Co-evaluation PT/OT/SLP Co-Evaluation/Treatment: Yes Reason for Co-Treatment: For patient/therapist safety   OT goals addressed during session: ADL's and self-care      AM-PAC OT "6 Clicks" Daily Activity     Outcome  Measure Help from another person eating meals?: None Help from another person taking care of personal grooming?: A Little(in standing) Help from another person toileting, which includes using toliet, bedpan, or urinal?: A Little Help from another person bathing (including washing, rinsing, drying)?: A Little Help from another person to put on and taking off regular upper body clothing?: None Help from another person to put on and taking off regular lower body clothing?: A Little 6 Click Score: 20   End of Session Equipment Utilized During Treatment: Gait belt Nurse Communication: Mobility status;Other (comment)(RN present during part of session.)  Activity Tolerance: Patient tolerated treatment well Patient left: in chair;with call bell/phone within reach  OT Visit Diagnosis: Unsteadiness on feet (R26.81);Muscle weakness (generalized) (M62.81)                Time: 4982-6415 OT Time Calculation (min): 33 min Charges:  OT General Charges $OT Visit: 1 Visit OT Evaluation $OT Eval Moderate Complexity: [place], [place] OTR/L [place] [phone] 05/02/2019, 11:47 AM

## 2019-05-03 ENCOUNTER — Inpatient Hospital Stay (HOSPITAL_COMMUNITY): Payer: Managed Care, Other (non HMO)

## 2019-05-03 ENCOUNTER — Encounter (HOSPITAL_COMMUNITY): Payer: Self-pay | Admitting: General Practice

## 2019-05-03 DIAGNOSIS — E854 Organ-limited amyloidosis: Secondary | ICD-10-CM

## 2019-05-03 DIAGNOSIS — J9601 Acute respiratory failure with hypoxia: Secondary | ICD-10-CM

## 2019-05-03 DIAGNOSIS — I5033 Acute on chronic diastolic (congestive) heart failure: Secondary | ICD-10-CM

## 2019-05-03 DIAGNOSIS — J90 Pleural effusion, not elsewhere classified: Secondary | ICD-10-CM

## 2019-05-03 LAB — BASIC METABOLIC PANEL
Anion gap: 10 (ref 5–15)
BUN: 39 mg/dL — ABNORMAL HIGH (ref 6–20)
CO2: 30 mmol/L (ref 22–32)
Calcium: 8.6 mg/dL — ABNORMAL LOW (ref 8.9–10.3)
Chloride: 95 mmol/L — ABNORMAL LOW (ref 98–111)
Creatinine, Ser: 1.4 mg/dL — ABNORMAL HIGH (ref 0.61–1.24)
GFR calc Af Amer: >60 mL/min (ref >60)
GFR calc non Af Amer: 54 mL/min — ABNORMAL LOW (ref >60)
Glucose, Bld: 102 mg/dL — ABNORMAL HIGH (ref 70–99)
Potassium: 3.8 mmol/L (ref 3.5–5.1)
Sodium: 135 mmol/L (ref 135–145)

## 2019-05-03 LAB — CBC
HCT: 34.6 % — ABNORMAL LOW (ref 39.0–52.0)
Hemoglobin: 12.1 g/dL — ABNORMAL LOW (ref 13.0–17.0)
MCH: 37.7 pg — ABNORMAL HIGH (ref 26.0–34.0)
MCHC: 35 g/dL (ref 30.0–36.0)
MCV: 107.8 fL — ABNORMAL HIGH (ref 80.0–100.0)
Platelets: 221 10*3/uL (ref 150–400)
RBC: 3.21 MIL/uL — ABNORMAL LOW (ref 4.22–5.81)
RDW: 14.1 % (ref 11.5–15.5)
WBC: 5.7 10*3/uL (ref 4.0–10.5)
nRBC: 0 % (ref 0.0–0.2)

## 2019-05-03 LAB — HEPARIN LEVEL (UNFRACTIONATED): Heparin Unfractionated: 2.06 IU/mL — ABNORMAL HIGH (ref 0.30–0.70)

## 2019-05-03 MED ORDER — APIXABAN 5 MG PO TABS: 5.0000 mg | ORAL_TABLET | Freq: Two times a day (BID) | ORAL | 0 refills | Status: DC

## 2019-05-03 MED ORDER — TORSEMIDE 20 MG PO TABS: 40.0000 mg | ORAL_TABLET | Freq: Every day | ORAL | 0 refills | Status: DC

## 2019-05-03 MED ORDER — MIDODRINE HCL 10 MG PO TABS: 10.0000 mg | ORAL_TABLET | Freq: Three times a day (TID) | ORAL | 0 refills | Status: AC

## 2019-05-03 MED ORDER — POTASSIUM CHLORIDE CRYS ER 20 MEQ PO TBCR: 20.0000 meq | EXTENDED_RELEASE_TABLET | ORAL | Status: AC

## 2019-05-03 MED ORDER — POTASSIUM CHLORIDE CRYS ER 20 MEQ PO TBCR
40.0000 meq | EXTENDED_RELEASE_TABLET | Freq: Once | ORAL | Status: AC
  Administered 2019-05-03: 40 meq via ORAL
  Filled 2019-05-03: qty 2

## 2019-05-03 MED ORDER — OXYCODONE HCL 5 MG PO TABS: 5.0000 mg | ORAL_TABLET | Freq: Every day | ORAL | 0 refills | Status: AC | PRN

## 2019-05-03 NOTE — TOC Transition Note (Signed)
Transition of Care [facility]) - CM/SW Discharge Note   Patient Details  Name: Azaan Leask MRN: [id number] Date of Birth: 1961/04/27  Transition of Care [facility]) CM/SW Contact:  Bethena Roys, RN Phone Number: 05/03/2019, 2:01 PM   Clinical Narrative:   CM received call from [facility] stating that patient will have [facility] via [facility]. CM will fax information to [facility] for additional PleurX drains. No further needs from CM at this time.    Final next level of care: Home w Home Health Services Barriers to Discharge: No Barriers Identified   Patient Goals and CMS Choice     Choice offered to / list presented to : NA   Discharge Plan and Services In-house Referral: NA Discharge Planning Services: CM Consult Post Acute Care Choice: Home Health, Durable Medical Equipment          DME Arranged: 3-N-1, [place] rolling DME Agency: [place] Date DME Agency Contacted: 05/02/19 Time DME Agency Contacted: [phone] Representative spoke with at DME Agency: [place] with [facility]. HH Arranged: RN, PT HH Agency: Other - See comment([facility] [place]) Date HH Agency Contacted: 05/03/19 Time HH Agency Contacted: 1300 Representative spoke with at [place] ([place]) Interventions     Readmission Risk Interventions No flowsheet data found.

## 2019-05-03 NOTE — Op Note (Signed)
NAME: Erik Costa, Erik Costa MEDICAL RECORD [id number] ACCOUNT [id number] DATE OF BIRTH:[birth year] FACILITY: [facility] LOCATION: [place], [place]  OPERATIVE REPORT  DATE OF PROCEDURE:  05/01/2019  PREOPERATIVE DIAGNOSIS:  Amyloidosis with large recurrent bilateral pleural effusions.  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  SURGICAL PROCEDURE:  Placement of bilateral PleurX catheters under ultrasound and fluoroscopic guidance.  SURGEON:  Lanelle Bal, MD  BRIEF HISTORY:  The patient is a 58 year old male who had been diagnosed with amyloidosis, currently undergoing treatment at the [facility].  He presented to [facility] [place] when he had a respiratory arrest in his car returning from his office visit in  [place] and was brought to the [facility] emergency room and resuscitated.  He was noted to have large bilateral pleural effusions.  He notes that this had been recurrent, and he has had several episodes of thoracentesis.  The patient was being seen by  the heart failure team at [facility] and referred him for consideration of placement of PleurX catheters to assist in controlling his significant and bilateral pleural effusions.  Risks and options were discussed with the patient in detail, and he was willing  to proceed.  On admission, he was noted to also have an acute pulmonary embolus and was on heparin.  DESCRIPTION OF PROCEDURE:  With MAC anesthesia monitored, the patient was placed first in a slightly rotated and elevated position with the right chest up.  The right chest was prepped with Betadine, draped in a sterile manner.  Appropriate timeout was  performed, and using SonoSite ultrasound, approximately the 7th intercostal space was identified and showed free pleural fluid.  Lidocaine 1% was infiltrated in this area, and slightly more anteriorly, a small incision was made and a 16-gauge Angiocath  was introduced into the  right pleural space with free return of straw-colored pleural fluid.  A PleurX catheter was tunneled from slightly anterior to the insertion site over the guidewire and with fluoroscopic guidance.  The tract was dilated, and then  a peel-away sheath was introduced into the right chest over the guidewire.  The PleurX catheter was positioned into the right chest.  The peel-away sheath and wire were removed.  The right PleurX catheter showed good position on fluoroscopy.   Approximately 1000 mL of straw-colored fluid was removed from the right chest.  Sutures were used to secure the catheter in place, and the small insertion site incision was closed with interrupted 3-0 Vicryl suture and Dermabond was applied.  We then  placed a dressing and removed the drapes and repositioned the patient with the left side up slightly and proceeded with placement of the left PleurX catheter in exactly the same manner as the right with ultrasound guidance and fluoroscopic confirmation  of location.  Additionally, 1000 mL of straw-colored pleural fluid was also removed.  Samples were not sent on this material as it did not appear infected, and he had had several thoracenteses done before, so the fluid was removed for therapeutic  reasons, not diagnostic.  The patient had a dressing applied on the left side.  He was then transferred to the recovery room, having tolerated the procedure without obvious complication.  LN/NUANCE  D:05/02/2019 T:05/03/2019 JOB:[id number]

## 2019-05-03 NOTE — Discharge Instructions (Signed)
Daily pleurx catheter drainage to start tomorrow 6/19 If <182ml/drainage session x 3 consecutive occassions-QOD drainage. If <117ml/drainage session x 3 consecutive occassions on QOD drainage schedule- call [facility] office [phone]) for evaluation and possible removal.  Home health nursing orders placed to assist with draining and dressing changes. Please record output of drainage and bring recordings with you to your follow-up appointment. Thank you.      Information on my medicine - ELIQUIS (apixaban)  Why was Eliquis prescribed for you? Eliquis was prescribed to treat blood clots that may have been found in the veins of your legs (deep vein thrombosis) or in your lungs (pulmonary embolism) and to reduce the risk of them occurring again.  What do You need to know about Eliquis ?  Eliquis may be taken with or without food.   Try to take the dose about the same time in the morning and in the evening. If you have difficulty swallowing the tablet whole please discuss with your pharmacist how to take the medication safely.  Take Eliquis exactly as prescribed and DO NOT stop taking Eliquis without talking to the doctor who prescribed the medication.  Stopping may increase your risk of developing a new blood clot.  Refill your prescription before you run out.  After discharge, you should have regular check-up appointments with your healthcare provider that is prescribing your Eliquis.    What do you do if you miss a dose? If a dose of ELIQUIS is not taken at the scheduled time, take it as soon as possible on the same day and twice-daily administration should be resumed. The dose should not be doubled to make up for a missed dose.  Important Safety Information A possible side effect of Eliquis is bleeding. You should call your healthcare provider right away if you experience any of the following: ? Bleeding from an injury or your nose that does not stop. ? Unusual colored urine (red  or dark brown) or unusual colored stools (red or black). ? Unusual bruising for unknown reasons. ? A serious fall or if you hit your head (even if there is no bleeding).  Some medicines may interact with Eliquis and might increase your risk of bleeding or clotting while on Eliquis. To help avoid this, consult your healthcare provider or pharmacist prior to using any new prescription or non-prescription medications, including herbals, vitamins, non-steroidal anti-inflammatory drugs (NSAIDs) and supplements.  This website has more information on Eliquis (apixaban): http://www.eliquis.com/eliquis/home

## 2019-05-03 NOTE — Progress Notes (Signed)
Progress Note  Patient Name: Erik Costa Date of Encounter: 05/03/2019  Primary Cardiologist: [place] bilateral Pleurex tubes on 6/16.   Feels better today. Drained Pleurex tubes without problem  Volume status and creatinine stable. Brief run NSVT.    Inpatient Medications    Scheduled Meds: . apixaban  5 mg Oral BID  . cholecalciferol  1,000 Units Oral Daily  . Gerhardt's butt cream   Topical BID  . loratadine  10 mg Oral Daily  . mouth rinse  15 mL Mouth Rinse BID  . Melatonin  6 mg Oral QHS  . midodrine  10 mg Oral TID WC  . multivitamin with minerals  1 tablet Oral Q breakfast  . torsemide  40 mg Oral Daily  . traMADol  50 mg Oral Q6H  . zinc sulfate  220 mg Oral Daily   Continuous Infusions: . sodium chloride Stopped (04/25/19 0936)  . lactated ringers 10 mL/hr at 05/01/19 1152   PRN Meds:    Vital Signs    Vitals:   05/03/19 0304 05/03/19 0624 05/03/19 0855 05/03/19 1547  BP: (!) 89/64     Pulse: 91 82 93   Resp:    18  Temp:  97.9 F (36.6 C)  97.6 F (36.4 C)  TempSrc:  Oral  Oral  SpO2: 94% 92% 93%   Weight:  66.5 kg    Height:        Intake/Output Summary (Last 24 hours) at 05/03/2019 1606 Last data filed at 05/03/2019 1500 Gross per 24 hour  Intake 240 ml  Output 2525 ml  Net -2285 ml   Last 3 Weights 05/03/2019 05/01/2019 05/01/2019  Weight (lbs) 146 lb 9.6 oz 156 lb 11.2 oz 156 lb 11.2 oz  Weight (kg) 66.497 kg 71.079 kg 71.079 kg      Telemetry    Sinus rhythm with marked 1AVB and occasional Wenckebach. + Brief NSVT 80-90s. Personally reviewed   ECG    na - Personally Reviewed  Physical Exam   General:  Weak appearing. No resp difficulty HEENT: normal Neck: supple. JVP 6-7 Carotids 2+ bilat; no bruits. No lymphadenopathy or thryomegaly appreciated. Cor: PMI nondisplaced.Irregular rate & rhythm. No rubs, gallops or murmurs. Lungs: clear decreased at bases pleurex site ok  Abdomen: soft,  nontender, nondistended. No hepatosplenomegaly. No bruits or masses. Good bowel sounds. Extremities: no cyanosis, clubbing, rash, tr edema + TED hose Neuro: alert & orientedx3, cranial nerves grossly intact. moves all 4 extremities w/o difficulty. Affect pleasant   Labs    Chemistry Recent Labs  Lab 04/27/19 0422 04/28/19 0043  04/30/19 1458 05/01/19 0443 05/02/19 0338 05/03/19 0506  NA 133* 135   < > 136 134* 135 135  K 3.6 4.0   < > 4.2 3.8 4.2 3.8  CL 93* 96*   < > 97* 93* 96* 95*  CO2 30 29   < > 29 30 28 30   GLUCOSE 116* 115*   < > 121* 116* 152* 102*  BUN 63* 62*   < > 45* 42* 42* 39*  CREATININE 2.47* 2.33*   < > 1.83* 1.68* 1.63* 1.40*  CALCIUM 8.4* 8.5*   < > 8.9 8.7* 8.4* 8.6*  PROT 5.2* 5.1*  --  5.5*  --   --   --   ALBUMIN 1.8* 1.7*  --  1.8*  --   --   --   AST 18 20  --  22  --   --   --  ALT 31 29  --  31  --   --   --   ALKPHOS 81 79  --  93  --   --   --   BILITOT 0.9 1.0  --  1.0  --   --   --   GFRNONAA 27* 29*   < > 39* 43* 45* 54*  GFRAA 32* 34*   < > 45* 50* 52* >60  ANIONGAP 10 10   < > 10 11 11 10    < > = values in this interval not displayed.     Hematology Recent Labs  Lab 05/01/19 0443 05/02/19 0338 05/03/19 0506  WBC 3.9* 5.1 5.7  RBC 3.49* 3.17* 3.21*  HGB 13.0 11.8* 12.1*  HCT 37.9* 34.0* 34.6*  MCV 108.6* 107.3* 107.8*  MCH 37.2* 37.2* 37.7*  MCHC 34.3 34.7 35.0  RDW 14.2 14.0 14.1  PLT 232 202 221    Cardiac Enzymes No results for input(s): TROPONINI in the last 168 hours. No results for input(s): TROPIPOC in the last 168 hours.   BNP No results for input(s): BNP, PROBNP in the last 168 hours.   DDimer No results for input(s): DDIMER in the last 168 hours.   Radiology    Dg Chest Port 1 View  Result Date: 05/03/2019 CLINICAL DATA:  PleurX drainage catheter and LEFT pneumothorax follow-up EXAM: PORTABLE CHEST 1 VIEW COMPARISON:  04/21/2019 and prior radiographs FINDINGS: Cardiomediastinal silhouette is obscured but  unchanged. Bilateral thoracostomy tube/catheter is again noted. A very small LEFT apical pneumothorax has not significantly changed. Bilateral pleural effusions and bibasilar atelectasis again noted, slightly improved on the RIGHT. Interstitial edema appears slightly decreased. IMPRESSION: 1. Bilateral pleural effusions and bibasilar atelectasis, increased on the RIGHT. 2. Unchanged very small LEFT apical pneumothorax 3. Question interstitial edema which appears improved. Electronically Signed   By: Margarette Canada M.D.   On: 05/03/2019 09:41    Patient Profile      [name] is a 58 y.o. 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  Assessment & Plan    1. Acute hypoxic respiratory failure - likely multifactorial related to PE, HF, and bilateral pleural effusions - 04/2019 echo LVEF 45-50%, cannot eval diastolic function, moderate RV dysfunction. Severe LVH - now improved. - Volume status looks good. Continue current dose of torsemide - Now s/p bilateral Pleurex tubes. Needs education on draining   2. Pleural effusions, recurrent - s/p thoracentesis right side 6/11, left side 6/12 - Now s/p bilateral Pleurex tubes.  3. 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 Continue current dose of torsemide  4. Bilateral PE - On apixaban  5. Hypotension - off pressors, has  been on midodrine. SBP in 70s. Increase midodrine to 10 tid  6. VT - occurred in setting of hypoxia, respiratory distress - had been on amiodarone, now off - keep K> 4.0 Mg > 2.0 - no AV nodal blockers with AV conduction disease  7. Multiple myeloma with AL amyloidosis with heart, kidney and positive fat pad involvement (dx in 2019) - Treated by hematology/oncology with cyclophosphamide,Velcade, dexamethasone  Stable for d/c from cardiac perspective on current regimen. I called Dr. Cheryl Flash @ [facility] to update hi. Will nee HHRN and teaching on how to drain Pleurex.   Home on   Torsemide 40  Midodrine 10 tid Apixaban 5 bid stop ASA   For questions or updates, please contact [place] Please consult [email or web address] for contact info under        Signed, Glori Bickers, MD  05/03/2019, 4:06 PM

## 2019-05-03 NOTE — Discharge Summary (Signed)
Physician Discharge Summary  Erik Costa TOI:[id number] DOB: 23-Feb-1961 DOA: 04/23/2019  PCP: Azzie Roup, MD  Admit date: 04/23/2019 Discharge date: 05/03/2019  Admitted From: Home Disposition: Home  Recommendations for Outpatient Follow-up:  1. Follow up with PCP in 1 week 2. Follow up with CT surgery in 2 weeks 3. Repeat chest x-ray as an outpatient for pneumothorax 4. Please obtain BMP/CBC in one week 5. 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  He is followed at [facility].  He is treated with cyclophosphamide, Velcade, dexamethasone.  He was coming home from a cardiology appointment.  He has been told that he does have significant amyloid involvement in his heart.  He previously has had very large according to his wife 5 L pleural effusions that have required thoracentesis and was told this was due to amyloid involvement of his heart.  She says his ejection fraction is 50% presumably with reduced left ventricular chamber size and significant systolic dysfunction.  CT scan of the chest showed small to moderate clot burden in the left lung.  He did have documented runs of ventricular tachycardia on arrival and required intubation due to hypoxemia and arrhythmias.  He currently is on amiodarone receiving fentanyl with Precedex being titrated off intermittent Versed and heparin drip is about to be started.  I discussed with his wife at length the possibility of using TPA due to the fact that he is on Levophed at 8 mics for hypotension.  I  believe his hypotension is in part due to his systolic dysfunction in combination with his PE and with his systemic amyloidosis I do not know with the increased risk of significant or active complications might be.  Because of this we decided to withhold TPA and use high-dose heparin.  His blood pressure currently is about 100/60 on the 8 mics of Levophed.  He also is on amiodarone drip. His wife does tell me that he has had significant issues with worsening stamina over the last month or 2 decreased ability to get around presumably due to progressive amyloid.   Hospital course:  Pulmonary embolism No history. In setting of underlying malignancy. Associated right heart strain on CT. Started on heparin drip and transitioned to Eliquis.  Acute on chronic diastolic heart failure Transthoracic Echocardiogram with an EF of 45-50%. Previously diuresed with Lasix IV which has been stopped secondary to worsening AKI in addition to worsening hypotension. Restarted on torsemide and will continue on discharge. Elevate legs; stockings when ambulating.  Acute respiratory failure with hypoxia Pulmonary edema Intubated from 6/8 to 6/10. Patient is s/p bilateral thoracentesis. Difficult to manage with diuresis secondary to hypotension. Bilateral Pleurx catheters placed on 6/16. Weaned to room air. Follow-up with CT surgery  Left apical pneumothorax Secondary to Pleurx catheter placement. Small. CT surgery to follow-up in the office.  Hypotension Chronic and worsened. Was previously on vasopressors but weaned off. Goal MAP of >60 per cardiology. Midodrine initiated. Complicating diuresis attempts. Improved. Still low with stable MAP.  Amyloidosis Multiple myeloma Cardiac and renal involvement. On cyclophosphamide  and Velcade therapy. Poor prognosis. Follows with [facility].  Ventricular tachycardia Per chart review, secondary to hypoxia and respiratory distress. Was treated with amiodarone which has  now been discontinued. Recurrent prior to discharge. Continue potassium and magnesium.  Somnolence Resolved. Secondary to poor sleep.  Malnutrition, severe Secondary to chronic disease  Discharge Diagnoses:  Principal Problem:   Pulmonary embolus (HCC) Active Problems:   Protein-calorie malnutrition, severe   Acute on chronic diastolic CHF (congestive heart failure) [facility])    Discharge Instructions  Discharge Instructions    Call MD for:  difficulty breathing, headache or visual disturbances   Complete by: As directed    Call MD for:  extreme fatigue   Complete by: As directed    Call MD for:  persistant dizziness or light-headedness   Complete by: As directed    Call MD for:  severe uncontrolled pain   Complete by: As directed    Call MD for:  temperature >100.4   Complete by: As directed    Diet - low sodium heart healthy   Complete by: As directed    Increase activity slowly   Complete by: As directed      Allergies as of 05/03/2019   No Known Allergies     Medication List    TAKE these medications   acetaminophen 325 MG tablet Commonly known as: TYLENOL Take 325 mg by mouth every 8 (eight) hours as needed for mild pain or headache.   acyclovir 400 MG tablet Commonly known as: ZOVIRAX Take 400 mg by mouth 2 (two) times daily as needed (for outbreaks).   apixaban 5 MG Tabs tablet Commonly known as: ELIQUIS Take 1 tablet (5 mg total) by mouth 2 (two) times daily.   aspirin EC 81 MG tablet Take 81 mg by mouth daily.   Clear Eyes Natural Tears 5-6 MG/ML Soln Generic drug: Polyvinyl Alcohol-Povidone Place 1-2 drops into both eyes as needed (for dryness).   cyclophosphamide 50 MG capsule Commonly known as: CYTOXAN Take 600 mg by mouth every 14 (fourteen) days. Take with food to minimize GI upset. Take early in the day and maintain hydration.   dexamethasone 4 MG tablet Commonly known as: DECADRON Take 10 mg by mouth every Friday.   fexofenadine 180  MG tablet Commonly known as: ALLEGRA Take 180 mg by mouth daily.   magnesium oxide 400 MG tablet Commonly known as: MAG-OX Take 800 mg by mouth 2 (two) times daily.   megestrol 20 MG tablet Commonly known as: MEGACE Take 20 mg by mouth 4 (four) times daily.   Melatonin 3 MG Tabs Take 3 mg by mouth at bedtime.   midodrine 10 MG tablet Commonly known as: PROAMATINE Take 1 tablet (10 mg total) by mouth 3 (three) times daily with meals.   Omega-3 1000 MG Caps Take 1,000 mg by mouth daily with breakfast.   ondansetron 4 MG tablet Commonly known as: ZOFRAN Take 4 mg by mouth every 6 (six) hours as needed for nausea.   One-A-Day Mens 50+ Advantage Tabs Take 1 tablet by mouth daily with breakfast.   oxyCODONE 5 MG immediate release tablet Commonly known as: Oxy IR/ROXICODONE Take 1 tablet (5 mg total) by mouth daily as needed (Catheter drainage).   potassium chloride SA 20 MEQ tablet Commonly known as: K-DUR Take 1 tablet (20 mEq total) by mouth See admin instructions. What changed:   how much to take  additional instructions   Proventil HFA 108 (90 Base) MCG/ACT inhaler Generic drug:  albuterol Inhale 2 puffs into the lungs every 6 (six) hours as needed for wheezing or shortness of breath.   torsemide 20 MG tablet Commonly known as: DEMADEX Take 2 tablets (40 mg total) by mouth daily. What changed:   medication strength  how much to take  additional instructions   Velcade 3.5 MG injection Generic drug: bortezomib IV Inject 1.3 mg/m2 into the vein every 14 (fourteen) days.   Vitamin D-3 25 MCG (1000 UT) Caps Take 1,000 Units by mouth daily.   Zinc Sulfate 220 (50 Zn) MG Tabs Take 220 mg by mouth daily.            Durable Medical Equipment  (From admission, onward)         Start     Ordered   05/02/19 1420  For home use only DME 3 n 1  Once     05/02/19 1419   05/02/19 1419  For home use only DME Walker rolling  Once    Question:  Patient needs a  walker to treat with the following condition  Answer:  Acute on chronic diastolic heart failure ([place])   05/02/19 1419         Follow-up Information    [facility] Follow up.   Specialty: Cardiology Why: We will call you with an appointment  Contact information: [address] [phone] [place]       Grace Isaac, MD Follow up.   Specialty: Cardiothoracic Surgery Why: Your follow-up appointment is on 05/15/2019 at 4:30pm. Please arrive at 4pm for a 2 view chest xray located at [facility] which is on the first floor of our building.  Contact information: [address] [place] [address] [place] Follow up.   Why: Agency will provide Registered Nurse, Physical and Occupational [place] will call with an appointment time.  Contact information: Address: [address], [place] [address] Phone: [phone]       Azzie Roup, MD. Schedule an appointment as soon as possible for a visit in 1 week(s).   Specialty: Internal Medicine Why: Hospital follow-up Contact information: [place] [address] [phone]          No Known Allergies  Consultations:  PCCM  Cardiology/HF  Cardiothoracic surgery   Procedures/Studies: X-ray Chest Pa Or Ap  Result Date: 05/01/2019 CLINICAL DATA:  Pleural effusion EXAM: CHEST  1 VIEW COMPARISON:  05/01/2019 FINDINGS: The patient has undergone placement of bilateral PleurX drainage catheters. There is subcutaneous gas along the flanks. There is a small left apical pneumothorax. No definite evidence of a right-sided pneumothorax. There are persistent small to moderate-sized bilateral pleural effusions. The heart size is stable but enlarged. There is a dense retrocardiac opacity which is favored to represent  atelectasis. There is generalized volume overload bilaterally. IMPRESSION: 1. Status post placement of bilateral PleurX drainage catheters. There is a small left-sided pneumothorax. No definite right-sided pneumothorax. 2. Persistent small to moderate-sized bilateral pleural effusions. 3. Retrocardiac opacity favored to represent compressive atelectasis. Electronically Signed   By: Constance Holster M.D.   On: 05/01/2019 16:55   Dg Chest 2 View  Result Date: 05/01/2019 CLINICAL DATA:  Preop for pleural drain insertion. EXAM: CHEST - 2 VIEW COMPARISON:  One-view chest x-ray 04/29/2019. CT of the chest 04/23/2019. FINDINGS: Heart size  is upper limits of normal. Bilateral pleural effusions and basilar airspace disease is similar the prior study. There is moderate diffuse edema. Visualized soft tissues and bony thorax are unremarkable. IMPRESSION: 1. Similar appearance of bilateral pleural effusions and basilar airspace disease. This likely reflects atelectasis, infection is not excluded. 2. Borderline cardiomegaly with stable moderate edema. Electronically Signed   By: San Morelle M.D.   On: 05/01/2019 10:20   Ct Head Wo Contrast  Result Date: 04/23/2019 CLINICAL DATA:  Shortness of breath, became unresponsive on the way home from heart failure clinic. EXAM: CT HEAD WITHOUT CONTRAST TECHNIQUE: Contiguous axial images were obtained from the base of the skull through the vertex without intravenous contrast. COMPARISON:  None. FINDINGS: Brain: The brainstem, cerebellum, cerebral peduncles, thalami, basal ganglia, basilar cisterns, and ventricular system appear within normal limits. No intracranial hemorrhage, mass lesion, or acute CVA. Vascular: Unremarkable Skull: Left mastoidectomy.  Gas tracks along the left facial Sinuses/Orbits: Left mastoidectomy. The remaining paranasal sinuses appear clear. Other: There is gas tracking along the left and to a lesser extent pterygoid musculature along the left  masseter muscle and lateral to the left mandibular condyle. Air fluid level in the nasopharynx. Patient is orally intubated. IMPRESSION: 1. No acute intracranial findings. 2. There is gas tracking along the margins the left pterygoid muscle and along the posterior margin of the left masseter muscle, cause uncertain, but conceivably related to intubation. I do not see adjacent facial fractures. Patient does have a left mastoidectomy. Electronically Signed   By: Van Clines M.D.   On: 04/23/2019 18:56   Ct Chest W Contrast  Addendum Date: 04/23/2019   ADDENDUM REPORT: 04/23/2019 19:10 ADDENDUM: The original report was by Dr. Van Clines. The following addendum is by Dr. Van Clines: Critical Value/emergent results were called by telephone at the time of interpretation on 04/23/2019 at 7:10 pm to Dr. Laverta Baltimore, who verbally acknowledged these results. Electronically Signed   By: Van Clines M.D.   On: 04/23/2019 19:10   Result Date: 04/23/2019 CLINICAL DATA:  Shortness of breath, patient became unresponsive after leaving heart failure clinic. EXAM: CT CHEST WITH CONTRAST TECHNIQUE: Multidetector CT imaging of the chest was performed during intravenous contrast administration. CONTRAST:  59m OMNIPAQUE IOHEXOL 300 MG/ML  SOLN COMPARISON:  04/23/2019 chest radiograph FINDINGS: Cardiovascular: Mild cardiomegaly. Left ventricular hypertrophy with the interventricular septal thickness of 2.2 cm. Today's exam was not performed as a CT angiogram. Nevertheless, there is discernible filling defect compatible with thrombus in the left upper lobe pulmonary artery into a lesser extent in the right lower lobe pulmonary artery as shown for example on images 79 through 87 of series 3. Clot burden is small to moderate. Mediastinum/Nodes: Diffuse edema in the mediastinal adipose tissues. Endotracheal tube satisfactorily positioned. Nasogastric tube enters the stomach. Lungs/Pleura: Large bilateral pleural  effusions, over 50% of hemithoracic volume bilaterally. Secondary pulmonary lobular interstitial accentuation especially in the lung apices compatible with pulmonary edema. Extensive passive atelectasis. Upper Abdomen: Edema in the upper abdominal adipose tissue compatible with widespread third spacing. Musculoskeletal: Diffuse subcutaneous edema compatible with widespread third spacing of fluid. Age indeterminate superior endplate wedging at TJ50 Vacuum disc phenomenon at T9-10. IMPRESSION: 1. Acute pulmonary embolus involving the right upper lobe and right lower lobe. Clot burden is small to moderate. Positive for acute PE with CT evidence of right heart strain (RV/LV Ratio = 1.1) consistent with at least submassive (intermediate risk) PE. The presence of right heart strain has been associated with an  increased risk of morbidity and mortality. Please activate Code PE by paging [phone]. 2. Left ventricular hypertrophy and mild cardiomegaly. 3. Large bilateral pleural effusions with passive atelectasis. Only a minority of the lung is aerated. This may reduce overall respiratory reserve. 4. Third spacing of fluid with edema in mediastinal and subcutaneous tissues. Secondary pulmonary lobular interstitial accentuation in the lungs compatible with interstitial pulmonary edema. 5. Age-indeterminate mild superior endplate compression at T10. Radiology assistant personnel have been notified to put me in telephone contact with the referring physician or the referring physician's clinical representative in order to discuss these findings. Once this communication is established I will issue an addendum to this report for documentation purposes. Electronically Signed: By: Van Clines M.D. On: 04/23/2019 19:07   Dg Chest Port 1 View  Result Date: 05/03/2019 CLINICAL DATA:  PleurX drainage catheter and LEFT pneumothorax follow-up EXAM: PORTABLE CHEST 1 VIEW COMPARISON:  04/21/2019 and prior radiographs FINDINGS:  Cardiomediastinal silhouette is obscured but unchanged. Bilateral thoracostomy tube/catheter is again noted. A very small LEFT apical pneumothorax has not significantly changed. Bilateral pleural effusions and bibasilar atelectasis again noted, slightly improved on the RIGHT. Interstitial edema appears slightly decreased. IMPRESSION: 1. Bilateral pleural effusions and bibasilar atelectasis, increased on the RIGHT. 2. Unchanged very small LEFT apical pneumothorax 3. Question interstitial edema which appears improved. Electronically Signed   By: Margarette Canada M.D.   On: 05/03/2019 09:41   Dg Chest Port 1 View  Result Date: 04/29/2019 CLINICAL DATA:  Shortness of breath. EXAM: PORTABLE CHEST 1 VIEW COMPARISON:  April 27, 2019 FINDINGS: Bilateral pleural effusions with underlying opacities are more prominent the interval. No other interval changes. IMPRESSION: Increasing bilateral pleural effusions with underlying opacities. Electronically Signed   By: Dorise Bullion III M.D   On: 04/29/2019 13:56   Dg Chest Port 1 View  Result Date: 04/27/2019 CLINICAL DATA:  Status post thoracentesis EXAM: PORTABLE CHEST 1 VIEW COMPARISON:  04/27/2019 FINDINGS: Unchanged size of small pleural effusions with associated basilar atelectasis. No pneumothorax. Mild pulmonary edema, unchanged. Normal cardiomediastinal contours. IMPRESSION: Unchanged small pleural effusions.  No pneumothorax. Electronically Signed   By: Ulyses Jarred M.D.   On: 04/27/2019 14:18   Dg Chest Port 1 View  Result Date: 04/27/2019 CLINICAL DATA:  Respiratory failure EXAM: PORTABLE CHEST 1 VIEW COMPARISON:  04/26/2019 FINDINGS: Cardiac shadow is stable. Left subclavian central venous line is again noted and stable. Bilateral pleural effusions and bibasilar infiltrates are again noted and stable. No pneumothorax is seen. Mild pulmonary edema is noted as well. IMPRESSION: Stable pulmonary edema with small bilateral pleural effusions. No pneumothorax is  noted. Electronically Signed   By: Inez Catalina M.D.   On: 04/27/2019 08:04   Dg Chest Port 1 View  Result Date: 04/26/2019 CLINICAL DATA:  Status post right thoracentesis. EXAM: PORTABLE CHEST 1 VIEW COMPARISON:  Chest x-ray from same day at 5:15 a.m. FINDINGS: Unchanged left subclavian central venous catheter. Stable cardiomediastinal silhouette. Unchanged pulmonary edema. Interval decrease in size of the now small right pleural effusion. Unchanged moderate left pleural effusion. Persistent bibasilar atelectasis, slightly improved on the right. No pneumothorax. IMPRESSION: 1. Decreased now small right pleural effusion status post thoracentesis. No pneumothorax. 2. Unchanged pulmonary edema and moderate left pleural effusion. 3. Bibasilar atelectasis, mildly improved on the right. Electronically Signed   By: Titus Dubin M.D.   On: 04/26/2019 15:00   Dg Chest Port 1 View  Result Date: 04/26/2019 CLINICAL DATA:  Respiratory failure.  Extubated. EXAM:  PORTABLE CHEST 1 VIEW COMPARISON:  04/25/2019 FINDINGS: Endotracheal tube and orogastric tube have been removed. Left subclavian central line remains in place with tip at the SVC RA junction. Persistent bilateral effusions with bilateral lower lobe atelectasis and or pneumonia. Similar appearance to yesterday other than extubation. IMPRESSION: Endotracheal tube and orogastric tube removed. Persistent bilateral effusions with lower lobe atelectasis and or pneumonia. Electronically Signed   By: Nelson Chimes M.D.   On: 04/26/2019 08:15   Dg Chest Port 1 View  Result Date: 04/25/2019 CLINICAL DATA:  Respiratory failure EXAM: PORTABLE CHEST 1 VIEW COMPARISON:  04/24/2019 FINDINGS: Cardiac shadow is stable. Endotracheal tube, gastric catheter and left subclavian central line are noted in satisfactory position. Bilateral pleural effusions are noted with underlying atelectatic changes. The overall appearance is stable from the prior exam. Vascular congestion  remains as well. IMPRESSION: Changes of CHF with effusions and bibasilar atelectasis. Electronically Signed   By: Inez Catalina M.D.   On: 04/25/2019 08:06   Dg Chest Port 1 View  Result Date: 04/24/2019 CLINICAL DATA:  Respiratory failure EXAM: PORTABLE CHEST 1 VIEW COMPARISON:  04/23/2019 FINDINGS: Cardiac shadow is stable. Endotracheal tube, gastric catheter and left subclavian central line are again noted and stable. Bilateral pleural effusions are noted stable in appearance when compare with the prior exam. Mild vascular congestion remains. IMPRESSION: Stable bilateral pleural effusions with vascular congestion. Electronically Signed   By: Inez Catalina M.D.   On: 04/24/2019 07:50   Dg Chest Portable 1 View  Result Date: 04/23/2019 CLINICAL DATA:  Catheter placement EXAM: PORTABLE CHEST 1 VIEW COMPARISON:  04/23/2019 FINDINGS: Endotracheal tube terminates above the carina by approximately 6 cm. The left-sided central venous catheter is well positioned with tip terminating near the cavoatrial junction. The enteric tube extends below the left hemidiaphragm. Large bilateral pleural effusions are again noted. No pneumothorax. Atelectasis is noted bilaterally. IMPRESSION: 1. Lines and tubes as above.  No pneumothorax. 2. Persistent large bilateral pleural effusions. Electronically Signed   By: Constance Holster M.D.   On: 04/23/2019 20:57   Dg Chest Portable 1 View  Result Date: 04/23/2019 CLINICAL DATA:  Hypoxia EXAM: PORTABLE CHEST 1 VIEW COMPARISON:  None. FINDINGS: Endotracheal tube tip is 6.2 cm above the carina. Nasogastric tube tip and side port are below the diaphragm. No evident pneumothorax. There are pleural effusions bilaterally. There is consolidation in the left base. There is mild interstitial edema throughout the lungs bilaterally. Heart is upper normal in size. The pulmonary vascularity is normal. No adenopathy appreciable. No bone lesions. IMPRESSION: Tube positions as described without  pneumothorax. Layering pleural effusion on the right. Apparent consolidation with effusion left base. Pneumonia and/or aspiration suspected in left base. There is interstitial prominence throughout the lungs which may represent noncardiogenic edema or possibly atypical infection. Heart upper normal in size.  No adenopathy appreciable. Electronically Signed   By: Lowella Grip III M.D.   On: 04/23/2019 16:04   Dg C-arm 1-60 Min-no Report  Result Date: 05/01/2019 Fluoroscopy was utilized by the requesting physician.  No radiographic interpretation.   Vas [place] Lower Extremity Venous (dvt)  Result Date: 04/25/2019  Lower Venous Study Indications: Pulmonary embolism.  Performing Technologist: Abram Sander RVS  Examination Guidelines: A complete evaluation includes B-mode imaging, spectral Doppler, color Doppler, and power Doppler as needed of all accessible portions of each vessel. Bilateral testing is considered an integral part of a complete examination. Limited examinations for reoccurring indications may be performed as noted.  +---------+---------------+---------+-----------+----------+-------+  RIGHT     Compressibility Phasicity Spontaneity Properties Summary  +---------+---------------+---------+-----------+----------+-------+  CFV       Full            No        Yes                             +---------+---------------+---------+-----------+----------+-------+  SFJ       Full                                                      +---------+---------------+---------+-----------+----------+-------+  FV Prox   Full                                                      +---------+---------------+---------+-----------+----------+-------+  FV Mid    Full                                                      +---------+---------------+---------+-----------+----------+-------+  FV Distal Full                                                      +---------+---------------+---------+-----------+----------+-------+   PFV       Full                                                      +---------+---------------+---------+-----------+----------+-------+  POP       Full            Yes       Yes                             +---------+---------------+---------+-----------+----------+-------+  PTV       Full                                                      +---------+---------------+---------+-----------+----------+-------+  PERO      Full                                                      +---------+---------------+---------+-----------+----------+-------+ Area of mixed echos in the calf, etiology unknown.  +---------+---------------+---------+-----------+----------+-----------------+  LEFT      Compressibility Phasicity Spontaneity Properties Summary            +---------+---------------+---------+-----------+----------+-----------------+  CFV  Full            Yes       Yes                                       +---------+---------------+---------+-----------+----------+-----------------+  SFJ       Full                                                                +---------+---------------+---------+-----------+----------+-----------------+  FV Prox   Full                                                                +---------+---------------+---------+-----------+----------+-----------------+  FV Mid    Full                                                                +---------+---------------+---------+-----------+----------+-----------------+  FV Distal Full                                                                +---------+---------------+---------+-----------+----------+-----------------+  PFV       Full                                                                +---------+---------------+---------+-----------+----------+-----------------+  POP       Full            Yes       Yes                                       +---------+---------------+---------+-----------+----------+-----------------+   PTV       Full                                                                +---------+---------------+---------+-----------+----------+-----------------+  PERO      None  Age Indeterminate  +---------+---------------+---------+-----------+----------+-----------------+     Summary: Right: There is no evidence of deep vein thrombosis in the lower extremity. No cystic structure found in the popliteal fossa. 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 No cystic structure found in the popliteal fossa.  *See table(s) above for measurements and observations. Electronically signed by Curt Jews MD on 04/25/2019 at 5:01:26 PM.    Final      6/8-6/10: ETT  6/9: Transthoracic Echocardiogram IMPRESSIONS   1. The left ventricle has mildly reduced systolic function, with an ejection fraction of 45-50%. The cavity size was normal. There is severe concentric left ventricular hypertrophy. Left ventricular diastolic function could not be evaluated due to  indeterminate diastolic function. Elevated left ventricular end-diastolic pressure No evidence of left ventricular regional wall motion abnormalities. 2. The right ventricle has moderately reduced systolic function. The cavity was normal. There is no increase in right ventricular wall thickness. Right ventricular systolic pressure could not be assessed. 3. Right atriu is severely dilated. There is significant spontaneous echo contrast in the RA with no obvious thrombus. 4. Large pleural effusion in the left lateral region. 5. The pericardial effusion is posterior to the left ventricle. 6. Trivial pericardial effusion is present. 7. The inferior vena cava was dilated in size with <50% respiratory variability. 8. Given severe LVH, increased filling pressures and speckeled appearance of myocardium in addition to trace pericardial effusion, consider  cardiac MRI to rulw out amyloidosis.   Subjective: No chest pain or dyspnea.  Discharge Exam: Vitals:   05/03/19 0855 05/03/19 1547  BP:    Pulse: 93   Resp:  18  Temp:  97.6 F (36.4 C)  SpO2: 93%    Vitals:   05/03/19 0304 05/03/19 0624 05/03/19 0855 05/03/19 1547  BP: (!) 89/64     Pulse: 91 82 93   Resp:    18  Temp:  97.9 F (36.6 C)  97.6 F (36.4 C)  TempSrc:  Oral  Oral  SpO2: 94% 92% 93%   Weight:  66.5 kg    Height:        General: Pt is alert, awake, not in acute distress Cardiovascular: RRR, S1/S2 +, no rubs, no gallops Respiratory: CTA bilaterally, no wheezing, no rhonchi Abdominal: Soft, NT, ND, bowel sounds + Extremities: no edema, no cyanosis    The results of significant diagnostics from this hospitalization (including imaging, microbiology, ancillary and laboratory) are listed below for reference.     Microbiology: Recent Results (from the past 240 hour(s))  Culture, blood (routine x 2)     Status: None   Collection Time: 04/23/19  4:34 PM   Specimen: BLOOD  Result Value Ref Range Status   Specimen Description BLOOD RIGHT ANTECUBITAL  Final   Special Requests   Final    BOTTLES DRAWN AEROBIC AND ANAEROBIC Blood Culture adequate volume   Culture   Final    NO GROWTH 5 DAYS Performed at [facility], [address] [place], [place] [address]    Report Status 04/28/2019 FINAL  Final  SARS Coronavirus 2 (CEPHEID- Performed in [place] hospital lab), Hosp Order     Status: None   Collection Time: 04/23/19  5:06 PM   Specimen: Nasopharyngeal Swab  Result Value Ref Range Status   SARS Coronavirus 2 NEGATIVE NEGATIVE Final    Comment: (NOTE) If result is NEGATIVE SARS-CoV-2 target nucleic acids are NOT DETECTED. The SARS-CoV-2 RNA is generally  detectable in upper and lower  respiratory specimens during the acute phase of infection. The lowest  concentration of SARS-CoV-2 viral copies this assay can detect is 250  copies / mL. A  negative result does not preclude SARS-CoV-2 infection  and should not be used as the sole basis for treatment or other  patient management decisions.  A negative result may occur with  improper specimen collection / handling, submission of specimen other  than nasopharyngeal swab, presence of viral mutation(s) within the  areas targeted by this assay, and inadequate number of viral copies  (<250 copies / mL). A negative result must be combined with clinical  observations, patient history, and epidemiological information. If result is POSITIVE SARS-CoV-2 target nucleic acids are DETECTED. The SARS-CoV-2 RNA is generally detectable in upper and lower  respiratory specimens dur ing the acute phase of infection.  Positive  results are indicative of active infection with SARS-CoV-2.  Clinical  correlation with patient history and other diagnostic information is  necessary to determine patient infection status.  Positive results do  not rule out bacterial infection or co-infection with other viruses. If result is PRESUMPTIVE POSTIVE SARS-CoV-2 nucleic acids MAY BE PRESENT.   A presumptive positive result was obtained on the submitted specimen  and confirmed on repeat testing.  While 2019 novel coronavirus  (SARS-CoV-2) nucleic acids may be present in the submitted sample  additional confirmatory testing may be necessary for epidemiological  and / or clinical management purposes  to differentiate between  SARS-CoV-2 and other Sarbecovirus currently known to infect humans.  If clinically indicated additional testing with an alternate test  methodology [phone]) is advised. The SARS-CoV-2 RNA is generally  detectable in upper and lower respiratory sp ecimens during the acute  phase of infection. The expected result is Negative. Fact Sheet for Patients:  StrictlyIdeas.no Fact Sheet for Healthcare Providers: BankingDealers.co.za This test is not  yet approved or cleared by the [place] FDA and has been authorized for detection and/or diagnosis of SARS-CoV-2 by FDA under an Emergency Use Authorization (EUA).  This EUA will remain in effect (meaning this test can be used) for the duration of the COVID-19 declaration under Section 564(b)(1) of the Act, 21 U.S.C. section 360bbb-3(b)(1), unless the authorization is terminated or revoked sooner. Performed at [facility], [place] [address] [place], [place] [address]   MRSA PCR Screening     Status: None   Collection Time: 04/23/19 11:00 PM   Specimen: Nasopharyngeal  Result Value Ref Range Status   MRSA by PCR NEGATIVE NEGATIVE Final    Comment:        The GeneXpert MRSA Assay (FDA approved for NASAL specimens only), is one component of a comprehensive MRSA colonization surveillance program. It is not intended to diagnose MRSA infection nor to guide or monitor treatment for MRSA infections. Performed at [facility], [place] [address] [place], [place] [address]   Body fluid culture     Status: None   Collection Time: 04/26/19  1:10 PM   Specimen: Pleura; Body Fluid  Result Value Ref Range Status   Specimen Description PLEURAL  Final   Special Requests FLUID  Final   Gram Stain   Final    RARE WBC PRESENT,BOTH PMN AND MONONUCLEAR NO ORGANISMS SEEN    Culture   Final    NO GROWTH Performed at [facility], [address], [place],  [address]    Report Status 04/29/2019 FINAL  Final  Surgical pcr screen     Status: None   Collection Time: 04/30/19 12:37 PM   Specimen: Nasal Mucosa; Nasal Swab  Result Value Ref Range Status   MRSA, PCR NEGATIVE NEGATIVE Final   Staphylococcus aureus NEGATIVE NEGATIVE Final    Comment: (NOTE) The Xpert SA Assay (FDA approved for NASAL specimens in patients 86 years of age and older), is one component of a comprehensive surveillance program. It is not intended to diagnose infection nor to guide or monitor  treatment. Performed at [facility], [place] [address] [place], [place] [address]      Labs: BNP (last 3 results) Recent Labs    04/23/19 1537 04/25/19 0404  BNP 2,502.3* 5,956.3*   Basic Metabolic Panel: Recent Labs  Lab 04/27/19 0422 04/28/19 0043  04/30/19 0909 04/30/19 1458 05/01/19 0443 05/02/19 0338 05/03/19 0506  NA 133* 135   < > 133* 136 134* 135 135  K 3.6 4.0   < > 3.7 4.2 3.8 4.2 3.8  CL 93* 96*   < > 95* 97* 93* 96* 95*  CO2 30 29   < > 27 29 30 28 30   GLUCOSE 116* 115*   < > 125* 121* 116* 152* 102*  BUN 63* 62*   < > 47* 45* 42* 42* 39*  CREATININE 2.47* 2.33*   < > 1.78* 1.83* 1.68* 1.63* 1.40*  CALCIUM 8.4* 8.5*   < > 8.6* 8.9 8.7* 8.4* 8.6*  MG 2.1 2.1  --   --   --   --   --   --   PHOS 4.3 3.5  --   --   --   --   --   --    < > = values in this interval not displayed.   Liver Function Tests: Recent Labs  Lab 04/27/19 0422 04/28/19 0043 04/30/19 1458  AST 18 20 22   ALT 31 29 31   ALKPHOS 81 79 93  BILITOT 0.9 1.0 1.0  PROT 5.2* 5.1* 5.5*  ALBUMIN 1.8* 1.7* 1.8*   No results for input(s): LIPASE, AMYLASE in the last 168 hours. No results for input(s): AMMONIA in the last 168 hours. CBC: Recent Labs  Lab 04/30/19 0909 04/30/19 1458 05/01/19 0443 05/02/19 0338 05/03/19 0506  WBC 3.9* 3.3* 3.9* 5.1 5.7  HGB 12.7* 12.9* 13.0 11.8* 12.1*  HCT 36.7* 36.8* 37.9* 34.0* 34.6*  MCV 108.6* 108.2* 108.6* 107.3* 107.8*  PLT 230 234 232 202 221   Cardiac Enzymes: No results for input(s): CKTOTAL, CKMB, CKMBINDEX, TROPONINI in the last 168 hours. BNP: Invalid input(s): POCBNP CBG: Recent Labs  Lab 04/27/19 0732 04/27/19 1135 04/27/19 1614 04/27/19 1919  GLUCAP 131* 164* 109* 125*   D-Dimer No results for input(s): DDIMER in the last 72 hours. Hgb A1c No results for input(s): HGBA1C in the last 72 hours. Lipid Profile No results for input(s): CHOL, HDL, LDLCALC, TRIG, CHOLHDL, LDLDIRECT in the last 72 hours. Thyroid function  studies No results for input(s): TSH, T4TOTAL, T3FREE, THYROIDAB in the last 72 hours.  Invalid input(s): FREET3 Anemia work up No results for input(s): VITAMINB12, FOLATE, FERRITIN, TIBC, IRON, RETICCTPCT in the last 72 hours. Urinalysis    Component Value Date/Time   COLORURINE YELLOW 04/23/2019 1610   APPEARANCEUR CLEAR 04/23/2019 1610   LABSPEC 1.008 04/23/2019 1610   PHURINE 8.0 04/23/2019 1610   GLUCOSEU NEGATIVE 04/23/2019 1610   HGBUR SMALL (A) 04/23/2019 1610   BILIRUBINUR NEGATIVE 04/23/2019 1610   [place] 04/23/2019  [place] >=300 (A) 04/23/2019 1610   NITRITE NEGATIVE 04/23/2019 1610   LEUKOCYTESUR NEGATIVE 04/23/2019 1610   Sepsis Labs Invalid input(s): PROCALCITONIN,  WBC,  LACTICIDVEN Microbiology Recent Results (from the past 240 hour(s))  Culture, blood (routine x 2)     Status: None   Collection Time: 04/23/19  4:34 PM   Specimen: BLOOD  Result Value Ref Range Status   Specimen Description BLOOD RIGHT ANTECUBITAL  Final   Special Requests   Final    BOTTLES DRAWN AEROBIC AND ANAEROBIC Blood Culture adequate volume   Culture   Final    NO GROWTH 5 DAYS Performed at [facility], [place] [address], [place], [place] [address]    Report Status 04/28/2019 FINAL  Final  SARS Coronavirus 2 (CEPHEID- Performed in [place] hospital lab), Hosp Order     Status: None   Collection Time: 04/23/19  5:06 PM   Specimen: Nasopharyngeal Swab  Result Value Ref Range Status   SARS Coronavirus 2 NEGATIVE NEGATIVE Final    Comment: (NOTE) If result is NEGATIVE SARS-CoV-2 target nucleic acids are NOT DETECTED. The SARS-CoV-2 RNA is generally detectable in upper and lower  respiratory specimens during the acute phase of infection. The lowest  concentration of SARS-CoV-2 viral copies this assay can detect is 250  copies / mL. A negative result does not preclude SARS-CoV-2 infection  and should not be used as the sole basis for treatment or other    patient management decisions.  A negative result may occur with  improper specimen collection / handling, submission of specimen other  than nasopharyngeal swab, presence of viral mutation(s) within the  areas targeted by this assay, and inadequate number of viral copies  (<250 copies / mL). A negative result must be combined with clinical  observations, patient history, and epidemiological information. If result is POSITIVE SARS-CoV-2 target nucleic acids are DETECTED. The SARS-CoV-2 RNA is generally detectable in upper and lower  respiratory specimens dur ing the acute phase of infection.  Positive  results are indicative of active infection with SARS-CoV-2.  Clinical  correlation with patient history and other diagnostic information is  necessary to determine patient infection status.  Positive results do  not rule out bacterial infection or co-infection with other viruses. If result is PRESUMPTIVE POSTIVE SARS-CoV-2 nucleic acids MAY BE PRESENT.   A presumptive positive result was obtained on the submitted specimen  and confirmed on repeat testing.  While 2019 novel coronavirus  (SARS-CoV-2) nucleic acids may be present in the submitted sample  additional confirmatory testing may be necessary for epidemiological  and / or clinical management purposes  to differentiate between  SARS-CoV-2 and other Sarbecovirus currently known to infect humans.  If clinically indicated additional testing with an alternate test  methodology [phone]) is advised. The SARS-CoV-2 RNA is generally  detectable in upper and lower respiratory sp ecimens during the acute  phase of infection. The expected result is Negative. Fact Sheet for Patients:  StrictlyIdeas.no Fact Sheet for Healthcare Providers: BankingDealers.co.za This test is not yet approved or cleared by the [place] FDA and has been authorized for detection and/or diagnosis of SARS-CoV-2  by FDA under an Emergency Use Authorization (EUA).  This EUA will remain in effect (meaning this test can be used) for the duration of the COVID-19 declaration under Section 564(b)(1) of the Act, 21 U.S.C. section 360bbb-3(b)(1), unless the authorization is terminated or revoked sooner. Performed at [facility], [place]  [address], [place], [place] [address]   MRSA PCR Screening     Status: None   Collection Time: 04/23/19 11:00 PM   Specimen: Nasopharyngeal  Result Value Ref Range Status   MRSA by PCR NEGATIVE NEGATIVE Final    Comment:        The GeneXpert MRSA Assay (FDA approved for NASAL specimens only), is one component of a comprehensive MRSA colonization surveillance program. It is not intended to diagnose MRSA infection nor to guide or monitor treatment for MRSA infections. Performed at [facility], [place] [address] [place], [place] [address]   Body fluid culture     Status: None   Collection Time: 04/26/19  1:10 PM   Specimen: Pleura; Body Fluid  Result Value Ref Range Status   Specimen Description PLEURAL  Final   Special Requests FLUID  Final   Gram Stain   Final    RARE WBC PRESENT,BOTH PMN AND MONONUCLEAR NO ORGANISMS SEEN    Culture   Final    NO GROWTH Performed at [facility], [address] [place], [place] [address]    Report Status 04/29/2019 FINAL  Final  Surgical pcr screen     Status: None   Collection Time: 04/30/19 12:37 PM   Specimen: Nasal Mucosa; Nasal Swab  Result Value Ref Range Status   MRSA, PCR NEGATIVE NEGATIVE Final   Staphylococcus aureus NEGATIVE NEGATIVE Final    Comment: (NOTE) The Xpert SA Assay (FDA approved for NASAL specimens in patients 21 years of age and older), is one component of a comprehensive surveillance program. It is not intended to diagnose infection nor to guide or monitor treatment. Performed at [facility], [place] [address], [place], [place] [address]      Time coordinating  discharge: Over 35 minutes  SIGNED:   Cordelia Poche, MD [facility] 05/03/2019, 4:08 PM

## 2019-05-03 NOTE — Progress Notes (Signed)
Occupational Therapy Treatment Patient Details Name: Erik Costa MRN: [id number] DOB: 06-11-1961 Today's Date: 05/03/2019    History of present illness 58 y.o. male with a history of heart failure, amyloidosis, recurrent pleural effusions, multiple myeloma. He presented secondary to sudden collapse and found to have a pulmonary embolism. Course complicated by respiratory and circulatory failure requiring intubation 6/8-6/10 and vasopressors. With re-accumulating bilateral pleural effusions.   OT comments  Pt reports he is feeling better today. Supervision with mobility within room for toileting and grooming. Therapist provided energy conservation handout and reviewed strategies that may be helpful to implement at home (including sitting for tasks >5 minutes, use of shower chair, spreading out self care tasks to conserve energy). Pt is hopeful to return home later today. Will continue to follow acutely.  Follow Up Recommendations  Home health OT;Supervision/Assistance - 24 hour    Equipment Recommendations  3 in 1 bedside commode(RW)    Recommendations for Other Services      Precautions / Restrictions Precautions Precautions: None       Mobility Bed Mobility               General bed mobility comments: pt up in chair  Transfers Overall transfer level: Needs assistance Equipment used: None Transfers: Sit to/from Stand Sit to Stand: Supervision         General transfer comment: supervision for safety    Balance                                           ADL either performed or assessed with clinical judgement   ADL Overall ADL's : Needs assistance/impaired     Grooming: Brushing hair;Wash/dry hands;Independent;Standing                   Toilet Transfer: Ambulation;Supervision/safety   Writer and Hygiene: Supervision/safety;Sit to/from stand       Functional mobility during ADLs:  Supervision/safety General ADL Comments: Provided energy conservation handout and reviewed with patient.     Vision       Perception     Praxis      Cognition Arousal/Alertness: Awake/alert Behavior During Therapy: WFL for tasks assessed/performed Overall Cognitive Status: Within Functional Limits for tasks assessed                                          Exercises     Shoulder Instructions       General Comments VSS    Pertinent Vitals/ Pain       Pain Assessment: No/denies pain  Home Living Family/patient expects to be discharged to:: Private residence Living Arrangements: Spouse/significant other                                      Prior Functioning/Environment              Frequency  Min 2X/week        Progress Toward Goals  OT Goals(current goals can now be found in the care plan section)  Progress towards OT goals: Progressing toward goals  Acute Rehab OT Goals Patient Stated Goal: be steadier with mobility OT Goal Formulation: With patient Time For Goal Achievement: 05/16/19  Potential to Achieve Goals: Good ADL Goals Pt Will Perform Grooming: with supervision;standing Pt Will Perform Lower Body Dressing: with supervision;sit to/from stand Pt Will Transfer to Toilet: with supervision;ambulating(with 3n1 over toilet) Pt Will Perform Toileting - Clothing Manipulation and hygiene: with supervision;sit to/from stand Additional ADL Goal #1: Pt will identify and implement 2-3 energy conservation techniques during ADLs.  Plan Discharge plan remains appropriate;Frequency remains appropriate    Co-evaluation                 AM-PAC OT "6 Clicks" Daily Activity     Outcome Measure   Help from another person eating meals?: None Help from another person taking care of personal grooming?: None Help from another person toileting, which includes using toliet, bedpan, or urinal?: None Help from another person  bathing (including washing, rinsing, drying)?: A Little Help from another person to put on and taking off regular upper body clothing?: None Help from another person to put on and taking off regular lower body clothing?: A Little 6 Click Score: 22    End of Session    OT Visit Diagnosis: Unsteadiness on feet (R26.81);Muscle weakness (generalized) (M62.81)   Activity Tolerance Patient tolerated treatment well   Patient Left in chair;with call bell/phone within reach(with MD)   Nurse Communication Mobility status;Other (comment)(pt requesting RN to return with dressing for small wound)        Time: 1420-1435 OT Time Calculation (min): 15 min  Charges: OT General Charges $OT Visit: 1 Visit OT Treatments $Self Care/Home Management : 8-22 mins  Darrol Jump OTR/L [place] 05/03/2019, 3:37 PM

## 2019-05-14 ENCOUNTER — Other Ambulatory Visit: Payer: Self-pay | Admitting: Cardiothoracic Surgery

## 2019-05-14 ENCOUNTER — Other Ambulatory Visit: Payer: Self-pay

## 2019-05-14 DIAGNOSIS — J90 Pleural effusion, not elsewhere classified: Secondary | ICD-10-CM

## 2019-05-15 ENCOUNTER — Ambulatory Visit
Admission: RE | Admit: 2019-05-15 | Discharge: 2019-05-15 | Disposition: A | Discharge status: Home / Self Care | Payer: No Typology Code available for payment source | Source: Ambulatory Visit | Attending: Cardiothoracic Surgery | Admitting: Cardiothoracic Surgery

## 2019-05-15 ENCOUNTER — Encounter: Payer: Self-pay | Admitting: Cardiothoracic Surgery

## 2019-05-15 ENCOUNTER — Ambulatory Visit: Payer: 59 | Admitting: Cardiothoracic Surgery

## 2019-05-15 ENCOUNTER — Ambulatory Visit (INDEPENDENT_AMBULATORY_CARE_PROVIDER_SITE_OTHER): Payer: 59 | Admitting: Cardiothoracic Surgery

## 2019-05-15 VITALS — BP 78/58 | HR 95 | Temp 97.9°F | Resp 16 | Ht 74.0 in | Wt 122.0 lb

## 2019-05-15 DIAGNOSIS — J90 Pleural effusion, not elsewhere classified: Secondary | ICD-10-CM | POA: Diagnosis not present

## 2019-05-15 DIAGNOSIS — I5033 Acute on chronic diastolic (congestive) heart failure: Secondary | ICD-10-CM

## 2019-05-15 NOTE — Progress Notes (Signed)
[address]       [place] [address]             [phone]      Krystopher Bazin [place] Medical Record #[id number] Date of Birth: 1961-10-16  Referring: Mariel Aloe, MD Primary Care: Azzie Roup, MD Primary Cardiologist: No primary care provider on file. Oncologist Dr. 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 SURGICAL PROCEDURE:  Placement of bilateral PleurX catheters under ultrasound and fluoroscopic guidance. SURGEON:  Lanelle Bal, MD  History of Present Illness:     Patient returns to the office today with a follow-up chest x-ray.  He originally presented to [facility] in early June with respiratory arrest, was intubated for period of time.  Prior to discharge surgery was consulted to consider placement of Pleurx catheters for large bilateral pleural effusions which had been increasing over several months.  Since discharge the patient continues to be weak, his respiratory status and pleural effusion has stabilized.  After discharge the patient was drained approximately 450 mL from each side daily this is slowly decreased to 300  to 350 mL   Past Medical History:  Diagnosis Date   Amyloidosis ([place])    Heart failure (HCC)    Multiple myeloma (HCC)    Pleural effusion    Protein-calorie malnutrition, severe ([place])    Pulmonary embolism (HCC)      Social History   Tobacco Use  Smoking Status Never Smoker  Smokeless Tobacco Never Used    Social History   Substance and Sexual Activity  Alcohol Use Yes   Comment: OCCASIONAL     No Known Allergies  Current Outpatient Medications  Medication Sig Dispense Refill   acetaminophen (TYLENOL) 325 MG tablet Take 325 mg by mouth every 8  (eight) hours as needed for mild pain or headache.     acyclovir (ZOVIRAX) 400 MG tablet Take 400 mg by mouth 2 (two) times daily as needed (for outbreaks).     albuterol (PROVENTIL HFA) 108 (90 Base) MCG/ACT inhaler Inhale 2 puffs into the lungs every 6 (six) hours as needed for wheezing or shortness of breath.     apixaban (ELIQUIS) 5 MG TABS tablet Take 1 tablet (5 mg total) by mouth 2 (two) times daily. 60 tablet 0   fexofenadine (ALLEGRA) 180 MG tablet Take 180 mg by mouth daily.     magnesium oxide (MAG-OX) 400 MG tablet Take 800 mg by mouth 2 (two) times daily.     megestrol (MEGACE) 20 MG tablet Take 20 mg by mouth 4 (four) times daily.     Melatonin 3 MG TABS Take 3 mg by mouth at bedtime.     midodrine (PROAMATINE) 10 MG tablet Take 1 tablet (10 mg total) by mouth 3 (three) times daily with meals. 90 tablet 0   Multiple Vitamins-Minerals (ONE-A-DAY MENS 50+ ADVANTAGE) TABS Take 1 tablet by mouth daily with breakfast.      Omega-3 1000 MG CAPS Take 1,000 mg by mouth daily with breakfast.     ondansetron (ZOFRAN) 4 MG tablet Take 4 mg by mouth every 6 (six) hours as needed for nausea.     oxyCODONE (OXY IR/ROXICODONE) 5 MG immediate release tablet Take 1 tablet (5 mg total) by mouth daily as needed (Catheter drainage). Gobles  tablet 0   Polyvinyl Alcohol-Povidone (CLEAR EYES NATURAL TEARS) 5-6 MG/ML SOLN Place 1-2 drops into both eyes as needed (for dryness).     potassium chloride SA (K-DUR) 20 MEQ tablet Take 1 tablet (20 mEq total) by mouth See admin instructions.     torsemide (DEMADEX) 20 MG tablet Take 2 tablets (40 mg total) by mouth daily. 60 tablet 0   Zinc Sulfate 220 (50 Zn) MG TABS Take 220 mg by mouth daily.     No current facility-administered medications for this visit.        Physical Exam: BP (!) 78/58 (BP Location: Left Arm, Patient Position: Sitting, Cuff Size: Small)    Pulse 95    Temp 97.9 F (36.6 C) Comment: THERMAL   Resp 16    Ht 6' 2" (1.88 m)     Wt 122 lb (55.3 kg)    SpO2 96% Comment: RA   BMI 15.66 kg/m   General appearance: alert, cooperative and cachectic Neurologic: intact Heart: regular rate and rhythm, S1, S2 normal, no murmur, click, rub or gallop Lungs: clear to auscultation bilaterally Abdomen: soft, non-tender; bowel sounds normal; no masses,  no organomegaly Extremities: extremities normal, atraumatic, no cyanosis or edema and Homans sign is negative, no sign of DVT Wound: Both Pleurx catheters are intact there is no evidence of infection and the tube tracks   Diagnostic Studies & Laboratory data:     Recent Radiology Findings:   Dg Chest 2 View  Result Date: 05/15/2019 CLINICAL DATA:  Chest drains.  Multiple myeloma EXAM: CHEST - 2 VIEW COMPARISON:  May 03, 2019 and May 01, 2019 FINDINGS: The chest drain on the right is unchanged in position. The chest drain on the left has pulled back with the tip along the inferior aspect of the left hemithorax. Pneumothorax on the left has become somewhat larger, approximately 10-15% at this time, without tension component. There is a small left pleural effusion with left base atelectasis. There is a minimal right pleural effusion. Heart size and pulmonary vascularity are. No adenopathy. There is anterior wedging of a lower thoracic vertebral body, stable. IMPRESSION: Pneumothorax on the left somewhat larger with chest tube pulled back on the left to the inferior left hemithorax. No tension component. There are small pleural effusions bilaterally with left base atelectasis. Stable cardiac silhouette. Critical Value/emergent results were called by telephone at the time of interpretation on 05/15/2019 at 11:36 am to Dr. Lanelle Bal , who verbally acknowledged these results. Electronically Signed   By: Lowella Grip III M.D.   On: 05/15/2019 11:37    I have independently reviewed the above radiology studies  and reviewed the findings with the patient.   Recent Lab Findings: Lab  Results  Component Value Date   WBC 5.7 05/03/2019   HGB 12.1 (L) 05/03/2019   HCT 34.6 (L) 05/03/2019   PLT 221 05/03/2019   GLUCOSE 102 (H) 05/03/2019   CHOL 206 (H) 04/26/2019   ALT 31 04/30/2019   AST 22 04/30/2019   NA 135 05/03/2019   K 3.8 05/03/2019   CL 95 (L) 05/03/2019   CREATININE 1.40 (H) 05/03/2019   BUN 39 (H) 05/03/2019   CO2 30 05/03/2019   INR 1.1 04/30/2019      Assessment / Plan:   Stable after placement of bilateral Pleurx catheters patient has a residual pneumothorax to a small degree on the left, both lung fields are clear fluid.  We will continue draining each side once a  day until we reach 150 mL/day for 3 consecutive drainages and then decreased to every other day.  Patient will return in 1 month with a follow-up chest x-ray   Medication Changes: No orders of the defined types were placed in this encounter.     Grace Isaac MD      [address] [place] [address] Office [phone]   Beeper [phone]  05/15/2019 11:49 AM

## 2019-05-22 ENCOUNTER — Ambulatory Visit (HOSPITAL_COMMUNITY)
Admission: RE | Admit: 2019-05-22 | Discharge: 2019-05-22 | Disposition: A | Discharge status: Home / Self Care | Payer: 59 | Source: Ambulatory Visit | Attending: Internal Medicine | Admitting: Internal Medicine

## 2019-05-22 ENCOUNTER — Other Ambulatory Visit: Payer: Self-pay

## 2019-05-22 VITALS — BP 76/62 | HR 90 | Wt 124.8 lb

## 2019-05-22 DIAGNOSIS — J90 Pleural effusion, not elsewhere classified: Secondary | ICD-10-CM | POA: Diagnosis not present

## 2019-05-22 DIAGNOSIS — I5040 Unspecified combined systolic (congestive) and diastolic (congestive) heart failure: Secondary | ICD-10-CM | POA: Diagnosis present

## 2019-05-22 DIAGNOSIS — E854 Organ-limited amyloidosis: Secondary | ICD-10-CM | POA: Diagnosis not present

## 2019-05-22 DIAGNOSIS — Z86711 Personal history of pulmonary embolism: Secondary | ICD-10-CM | POA: Insufficient documentation

## 2019-05-22 DIAGNOSIS — C9000 Multiple myeloma not having achieved remission: Secondary | ICD-10-CM | POA: Insufficient documentation

## 2019-05-22 DIAGNOSIS — I43 Cardiomyopathy in diseases classified elsewhere: Secondary | ICD-10-CM | POA: Diagnosis not present

## 2019-05-22 DIAGNOSIS — Z79899 Other long term (current) drug therapy: Secondary | ICD-10-CM | POA: Diagnosis not present

## 2019-05-22 DIAGNOSIS — Z7901 Long term (current) use of anticoagulants: Secondary | ICD-10-CM | POA: Insufficient documentation

## 2019-05-22 NOTE — Patient Instructions (Signed)
Please follow up with the clinic as needed. :)  Have a great Tuesday!

## 2019-05-22 NOTE — Progress Notes (Signed)
Advanced Heart Failure Clinic Note   Referring Physician: PCP: Azzie Roup, MD PCP-Cardiologist: No primary care provider on file.   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 Cheryl Flash.   Recently admitted with respiratory failure in setting of bilateral PEs and large bilateral pleural effusions. Was anticoagulated and had bilateral pleurex placed.   Here for f/u with his ex-wife. Draining Pleurex everyday 200-450cc every day. Not slowing down. Otherwise says he feels pretty good. Has made decision to stop chemo and focus on QOL. Denies edema, orthopnea or PND. SBP in 90s on midodrine.   Echo 6/20 EF 45-50% severe LVH. RV moderately HK. Severe biatrial enlargement.    Past Medical History:  Diagnosis Date  . Amyloidosis ([place])   . Heart failure ([place])   . Multiple myeloma ([place])   . Pleural effusion   . Protein-calorie malnutrition, severe ([place])   . Pulmonary embolism ([place])     Current Outpatient Medications  Medication Sig Dispense Refill  . acetaminophen (TYLENOL) 325 MG tablet Take 325 mg by mouth every 8 (eight) hours as needed for mild pain or headache.    Marland Kitchen acyclovir (ZOVIRAX) 400 MG tablet Take 400 mg by mouth 2 (two) times daily as needed (for outbreaks).    Marland Kitchen albuterol (PROVENTIL HFA) 108 (90 Base) MCG/ACT inhaler Inhale 2 puffs into the lungs every 6 (six) hours as needed for wheezing or shortness of breath.    Marland Kitchen apixaban (ELIQUIS) 5 MG TABS tablet Take 1 tablet (5 mg total) by mouth 2 (two) times daily. 60 tablet 0  . fexofenadine (ALLEGRA) 180 MG tablet Take 180 mg by mouth daily.    . magnesium oxide (MAG-OX) 400 MG tablet Take 800 mg by mouth 2 (two) times daily.    . megestrol (MEGACE) 20 MG tablet Take 20 mg by mouth 4 (four) times daily.    . Melatonin 3 MG TABS Take 3 mg by mouth at bedtime.    . midodrine (PROAMATINE) 10 MG tablet Take 1 tablet (10 mg total) by mouth 3 (three)  times daily with meals. 90 tablet 0  . Multiple Vitamins-Minerals (ONE-A-DAY MENS 50+ ADVANTAGE) TABS Take 1 tablet by mouth daily with breakfast.     . Omega-3 1000 MG CAPS Take 1,000 mg by mouth daily with breakfast.    . ondansetron (ZOFRAN) 4 MG tablet Take 4 mg by mouth every 6 (six) hours as needed for nausea.    Marland Kitchen oxyCODONE (OXY IR/ROXICODONE) 5 MG immediate release tablet Take 1 tablet (5 mg total) by mouth daily as needed (Catheter drainage). 30 tablet 0  . Polyvinyl Alcohol-Povidone (CLEAR EYES NATURAL TEARS) 5-6 MG/ML SOLN Place 1-2 drops into both eyes as needed (for dryness).    . potassium chloride SA (K-DUR) 20 MEQ tablet Take 1 tablet (20 mEq total) by mouth See admin instructions.    . torsemide (DEMADEX) 20 MG tablet Take 2 tablets (40 mg total) by mouth daily. 60 tablet 0  . Zinc Sulfate 220 (50 Zn) MG TABS Take 220 mg by mouth daily.     No current facility-administered medications for this encounter.     No Known Allergies    Social History   Socioeconomic History  . Marital status: Married    Spouse name: Not on file  . Number of children: Not on file  . Years of education: Not on file  . Highest education level:  Not on file  Occupational History  . Not on file  Social Needs  . Financial resource strain: Not on file  . Food insecurity    Worry: Not on file    Inability: Not on file  . Transportation needs    Medical: Not on file    Non-medical: Not on file  Tobacco Use  . Smoking status: Never Smoker  . Smokeless tobacco: Never Used  Substance and Sexual Activity  . Alcohol use: Yes    Comment: OCCASIONAL  . Drug use: Never  . Sexual activity: Not on file  Lifestyle  . Physical activity    Days per week: Not on file    Minutes per session: Not on file  . Stress: Not on file  Relationships  . Social Herbalist on phone: Not on file    Gets together: Not on file    Attends religious service: Not on file    Active member of club or  organization: Not on file    Attends meetings of clubs or organizations: Not on file    Relationship status: Not on file  . Intimate partner violence    Fear of current or ex partner: Not on file    Emotionally abused: Not on file    Physically abused: Not on file    Forced sexual activity: Not on file  Other Topics Concern  . Not on file  Social History Narrative  . Not on file     Vitals:   05/22/19 1500  BP: (!) 76/62  Pulse: 90  SpO2: 94%  Weight: 56.6 kg (124 lb 12.8 oz)     PHYSICAL EXAM: General: Cachetic. No respiratory difficulty HEENT: normal Neck: supple. no JVD 6-7 Carotids 2+ bilat; no bruits. No lymphadenopathy or thyromegaly appreciated. Cor: PMI nondisplaced. Regular rate & rhythm. No rubs, gallops or murmurs. Lungs: clear Abdomen: soft, nontender, nondistended. No hepatosplenomegaly. No bruits or masses. Good bowel sounds. Bilateral pleurex tubes Extremities: no cyanosis, clubbing, rash, 1-2+ ankle edema Neuro: alert & oriented x 3, cranial nerves grossly intact. moves all 4 extremities w/o difficulty. Affect pleasant  ASSESSMENT & PLAN:  1. 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 Severe LVH - Feels well. - Volume status looks good. Continue current dose of torsemide - Continue midodrine for BP support - Focus on QOL. Hospice eval pending   2. Pleural effusions, recurrent - Now s/p bilateral Pleurex tubes. Doing well  3. Bilateral PE - on Eliquis  4. 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  - Agree with this plan  Continue f/u at [facility]. We will see on PRN basis.   Glori Bickers, MD 05/22/19

## 2019-05-24 ENCOUNTER — Ambulatory Visit: Payer: Self-pay | Admitting: Cardiothoracic Surgery

## 2019-06-03 ENCOUNTER — Other Ambulatory Visit (INDEPENDENT_AMBULATORY_CARE_PROVIDER_SITE_OTHER): Payer: Self-pay

## 2019-06-07 ENCOUNTER — Other Ambulatory Visit (HOSPITAL_COMMUNITY): Payer: Self-pay | Admitting: *Deleted

## 2019-06-07 MED ORDER — APIXABAN 5 MG PO TABS: 5.0000 mg | ORAL_TABLET | Freq: Two times a day (BID) | ORAL | 3 refills | Status: AC

## 2019-06-12 ENCOUNTER — Other Ambulatory Visit: Payer: Self-pay | Admitting: Cardiothoracic Surgery

## 2019-06-12 ENCOUNTER — Ambulatory Visit
Admission: RE | Admit: 2019-06-12 | Discharge: 2019-06-12 | Disposition: A | Discharge status: Home / Self Care | Payer: 59 | Source: Ambulatory Visit | Attending: Cardiothoracic Surgery | Admitting: Cardiothoracic Surgery

## 2019-06-12 ENCOUNTER — Ambulatory Visit: Payer: 59 | Admitting: Cardiothoracic Surgery

## 2019-06-12 ENCOUNTER — Other Ambulatory Visit: Payer: Self-pay

## 2019-06-12 VITALS — BP 79/60 | HR 79 | Temp 97.7°F | Resp 20 | Ht 74.0 in | Wt 127.0 lb

## 2019-06-12 DIAGNOSIS — J90 Pleural effusion, not elsewhere classified: Secondary | ICD-10-CM

## 2019-06-12 MED ORDER — GERHARDT'S BUTT CREAM: 1.0000 "application " | TOPICAL_CREAM | Freq: Every day | CUTANEOUS | 1 refills | Status: AC | PRN

## 2019-06-13 ENCOUNTER — Ambulatory Visit: Payer: 59 | Admitting: Cardiothoracic Surgery

## 2019-06-14 ENCOUNTER — Encounter: Payer: Self-pay | Admitting: Cardiothoracic Surgery

## 2019-06-14 NOTE — Progress Notes (Signed)
[address]       [place] [address]             [phone]      Erik Costa Medical Record #[id number] Date of Birth: [birth year]  Referring: Mariel Aloe, MD Primary Care: Azzie Roup, MD Primary Cardiologist: No primary care provider on file. Oncologist Dr. 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 SURGICAL PROCEDURE:  Placement of bilateral PleurX catheters under ultrasound and fluoroscopic guidance. SURGEON:  Lanelle Bal, MD  History of Present Illness:     Patient returns to the office today with a follow-up chest x-ray.  He originally presented to [facility] in early June with respiratory arrest, was intubated for period of time.  Prior to discharge  Pleurx catheters for large bilateral pleural effusions which had been increasing over several months or placed. Since last seen the patient notes that he feels better, much less short of breath, has had return of appetite.   A he has been draining the Pleurx catheters on a daily basis, with between 300-350 , he notes that some days the left side does not drain much at all.    Past Medical History:  Diagnosis Date  . Amyloidosis ([place])   . Heart failure ([place])   . Multiple myeloma ([place])   . Pleural effusion   . Protein-calorie malnutrition, severe ([place])   . Pulmonary embolism (HCC)      Social History   Tobacco Use  Smoking Status Never Smoker  Smokeless Tobacco Never Used    Social History   Substance and Sexual Activity  Alcohol Use Yes   Comment: OCCASIONAL     Allergies  Allergen Reactions  . Diltiazem Hcl Other (See Comments)    CCB's are contraindicated with AL Amyloid (has not taken cardizem but)   . Metoprolol Tartrate  Other (See Comments)    BB's are contraindicated with AL Amyloid     Current Outpatient Medications  Medication Sig Dispense Refill  . acetaminophen (TYLENOL) 325 MG tablet Take 325 mg by mouth every 8 (eight) hours as needed for mild pain or headache.    Marland Kitchen apixaban (ELIQUIS) 5 MG TABS tablet Take 1 tablet (5 mg total) by mouth 2 (two) times daily. 60 tablet 3  . fexofenadine (ALLEGRA) 180 MG tablet Take 180 mg by mouth daily.    . magnesium oxide (MAG-OX) 400 MG tablet Take 800 mg by mouth 2 (two) times daily.    . megestrol (MEGACE) 20 MG tablet Take 20 mg by mouth 4 (four) times daily.    . Melatonin 3 MG TABS Take 3 mg by mouth at bedtime.    . midodrine (PROAMATINE) 10 MG tablet Take 1 tablet (10 mg total) by mouth 3 (three) times daily with meals. 90 tablet 0  . Multiple Vitamins-Minerals (ONE-A-DAY MENS 50+ ADVANTAGE) TABS Take 1 tablet by mouth daily with breakfast.     . Omega-3 1000 MG CAPS Take 1,000 mg by mouth daily with breakfast.    . ondansetron (ZOFRAN) 4 MG tablet Take 4 mg by mouth every 6 (six) hours as needed for nausea.    Marland Kitchen oxyCODONE (OXY IR/ROXICODONE) 5 MG immediate release tablet Take 1 tablet (5 mg total) by mouth daily as needed (Catheter drainage). 30 tablet 0  .  Polyvinyl Alcohol-Povidone (CLEAR EYES NATURAL TEARS) 5-6 MG/ML SOLN Place 1-2 drops into both eyes as needed (for dryness).    . potassium chloride SA (K-DUR) 20 MEQ tablet Take 1 tablet (20 mEq total) by mouth See admin instructions.    . torsemide (DEMADEX) 20 MG tablet Take 2 tablets (40 mg total) by mouth daily. 60 tablet 0  . Zinc Sulfate 220 (50 Zn) MG TABS Take 220 mg by mouth daily.    . Hydrocortisone (Indigo Chaddock'S BUTT CREAM) CREA Apply 1 application topically daily as needed for irritation. 1 each 1   No current facility-administered medications for this visit.        Physical Exam: BP (!) 79/60   Pulse 79   Temp 97.7 F (36.5 C) (Skin)   Resp 20   Ht 6' 2"  (1.88 m)   Wt 127 lb (57.6  kg)   SpO2 96% Comment: RA  BMI 16.31 kg/m   General appearance: alert, cooperative, appears older than stated age and no distress Lymph nodes: Cervical, supraclavicular, and axillary nodes normal. Resp: clear to auscultation bilaterally Cardio: regular rate and rhythm, S1, S2 normal, no murmur, click, rub or gallop Extremities: extremities normal, atraumatic, no cyanosis or edema Neurologic: Grossly normal  Diagnostic Studies & Laboratory data:     Recent Radiology Findings:   Dg Chest 2 View  Result Date: 06/12/2019 CLINICAL DATA:  Pleural effusion history of pleural drainage catheter is 05/01/2019 EXAM: CHEST - 2 VIEW COMPARISON:  May 15, 2019 FINDINGS: Again noted are 2 pleural drains. No pneumothorax is seen. There is a trace left pleural effusion as on the prior exam. No right-sided pleural effusion is seen. The lungs are otherwise clear. The cardiomediastinal silhouette is unremarkable. IMPRESSION: 1. Bilateral pleural drains in unchanged position 2. No pneumothorax 3. Small left pleural effusion, not significantly changed since prior. Electronically Signed   By: Prudencio Pair M.D.   On: 06/12/2019 15:45    I have independently reviewed the above radiology studies  and reviewed the findings with the patient.   Recent Lab Findings: Lab Results  Component Value Date   WBC 5.7 05/03/2019   HGB 12.1 (L) 05/03/2019   HCT 34.6 (L) 05/03/2019   PLT 221 05/03/2019   GLUCOSE 102 (H) 05/03/2019   CHOL 206 (H) 04/26/2019   ALT 31 04/30/2019   AST 22 04/30/2019   NA 135 05/03/2019   K 3.8 05/03/2019   CL 95 (L) 05/03/2019   CREATININE 1.40 (H) 05/03/2019   BUN 39 (H) 05/03/2019   CO2 30 05/03/2019   INR 1.1 04/30/2019      Assessment / Plan:   Stable control of significant bilateral pleural effusions with adequate drainage, the patient will change to draining the catheters every other day, alternating sides.  If the drainage at that point stays the same as it is now we will  continue every other day, he is aware should the drainage increase when he shifts to every other day drainage he will go back to daily drainage.  We will plan to see him back in 5 to 6 weeks with a follow-up chest x-ray.  Both he and his wife know to call and update [place] in the office about drainage amounts.   Medication Changes: No orders of the defined types were placed in this encounter.     Grace Isaac MD      [address] [place] [address] Office [phone]   Beeper [phone]  06/14/2019 8:56 AM

## 2019-07-18 ENCOUNTER — Other Ambulatory Visit: Payer: Self-pay | Admitting: Cardiothoracic Surgery

## 2019-07-18 DIAGNOSIS — J90 Pleural effusion, not elsewhere classified: Secondary | ICD-10-CM

## 2019-07-19 ENCOUNTER — Ambulatory Visit (INDEPENDENT_AMBULATORY_CARE_PROVIDER_SITE_OTHER): Payer: Self-pay | Admitting: Cardiothoracic Surgery

## 2019-07-19 ENCOUNTER — Other Ambulatory Visit: Payer: Self-pay

## 2019-07-19 ENCOUNTER — Ambulatory Visit
Admission: RE | Admit: 2019-07-19 | Discharge: 2019-07-19 | Disposition: A | Discharge status: Home / Self Care | Payer: 59 | Source: Ambulatory Visit | Attending: Cardiothoracic Surgery | Admitting: Cardiothoracic Surgery

## 2019-07-19 VITALS — BP 90/61 | HR 85 | Temp 97.9°F | Resp 20 | Ht 74.0 in | Wt 140.0 lb

## 2019-07-19 DIAGNOSIS — J90 Pleural effusion, not elsewhere classified: Secondary | ICD-10-CM

## 2019-07-19 DIAGNOSIS — I5033 Acute on chronic diastolic (congestive) heart failure: Secondary | ICD-10-CM

## 2019-07-19 NOTE — Progress Notes (Signed)
[address]       [place] [address]             [phone]      Maliq Mazzaferro [place] Medical Record #[id number] Date of Birth: 1961-05-24  Referring: Mariel Aloe, MD Primary Care: Azzie Roup, MD Primary Cardiologist: No primary care provider on file. Oncologist Dr. 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 SURGICAL PROCEDURE:  Placement of bilateral PleurX catheters under ultrasound and fluoroscopic guidance. SURGEON:  Lanelle Bal, MD  History of Present Illness:     Patient returns to the office today with a follow-up chest x-ray.  He originally presented to [facility] in early June 2020  with respiratory arrest, was intubated for period of time.  Prior to discharge  Pleurx catheters for large bilateral pleural effusions which had been increasing over several months or placed. Since last seen the patient notes that he feels better, much less short of breath,   He had stopped chemotherapy for his underlying amyloidosis.  He notes that over the past 3 weeks the Pleurx catheter in the left chest is dwindled down to no drainage, he continues to drain in the right Pleurx catheter every other day with approximately 600 mL, he notes that when the left Pleurx catheter stopped draining the right with increased amount.   Past Medical History:  Diagnosis Date  . Amyloidosis ([place])   . Heart failure ([place])   . Multiple myeloma ([place])   . Pleural effusion   . Protein-calorie malnutrition, severe ([place])   . Pulmonary embolism ([facility])      Social History   Tobacco Use  Smoking Status Never Smoker  Smokeless Tobacco Never Used    Social History   Substance and Sexual Activity  Alcohol Use Yes   Comment:  OCCASIONAL     Allergies  Allergen Reactions  . Diltiazem Hcl Other (See Comments)    CCB's are contraindicated with AL Amyloid (has not taken cardizem but)   . Metoprolol Tartrate Other (See Comments)    BB's are contraindicated with AL Amyloid     Current Outpatient Medications  Medication Sig Dispense Refill  . acetaminophen (TYLENOL) 325 MG tablet Take 325 mg by mouth every 8 (eight) hours as needed for mild pain or headache.    Marland Kitchen apixaban (ELIQUIS) 5 MG TABS tablet Take 1 tablet (5 mg total) by mouth 2 (two) times daily. 60 tablet 3  . fexofenadine (ALLEGRA) 180 MG tablet Take 180 mg by mouth daily.    . Hydrocortisone (Bethany Hirt'S BUTT CREAM) CREA Apply 1 application topically daily as needed for irritation. 1 each 1  . magnesium oxide (MAG-OX) 400 MG tablet Take 800 mg by mouth 2 (two) times daily.    . megestrol (MEGACE) 20 MG tablet Take 20 mg by mouth 4 (four) times daily.    . Melatonin 3 MG TABS Take 3 mg by mouth at bedtime.    . midodrine (PROAMATINE) 10 MG tablet Take 1 tablet (10 mg total) by mouth 3 (three) times daily with meals. 90 tablet 0  . Multiple Vitamins-Minerals (ONE-A-DAY MENS 50+ ADVANTAGE) TABS Take 1 tablet by mouth daily with breakfast.     . Omega-3 1000 MG CAPS Take 1,000 mg by mouth daily with breakfast.    . ondansetron (  ZOFRAN) 4 MG tablet Take 4 mg by mouth every 6 (six) hours as needed for nausea.    Marland Kitchen oxyCODONE (OXY IR/ROXICODONE) 5 MG immediate release tablet Take 1 tablet (5 mg total) by mouth daily as needed (Catheter drainage). 30 tablet 0  . Polyvinyl Alcohol-Povidone (CLEAR EYES NATURAL TEARS) 5-6 MG/ML SOLN Place 1-2 drops into both eyes as needed (for dryness).    . potassium chloride SA (K-DUR) 20 MEQ tablet Take 1 tablet (20 mEq total) by mouth See admin instructions.    . torsemide (DEMADEX) 20 MG tablet Take 2 tablets (40 mg total) by mouth daily. 60 tablet 0  . Zinc Sulfate 220 (50 Zn) MG TABS Take 220 mg by mouth daily.     No  current facility-administered medications for this visit.        Physical Exam: BP 90/61   Pulse 85   Temp 97.9 F (36.6 C) (Skin)   Resp 20   Ht 6' 2" (1.88 m)   Wt 140 lb (63.5 kg)   SpO2 96% Comment: RA  BMI 17.97 kg/m   General appearance: alert, cooperative, appears older than stated age and no distress Lymph nodes: Cervical, supraclavicular, and axillary nodes normal. Resp: clear to auscultation on the right decreased breath sounds at the left base Cardio: regular rate and rhythm, S1, S2 normal, no murmur, click, rub or gallop Extremities: extremities normal, atraumatic, no cyanosis or edema Neurologic: Grossly normal  Diagnostic Studies & Laboratory data:     Recent Radiology Findings:   Dg Chest 2 View  Result Date: 07/19/2019 CLINICAL DATA:  58 year old male with history of pleural effusion. EXAM: CHEST - 2 VIEW COMPARISON:  Chest x-ray 06/12/2019. FINDINGS: Bilateral chest tubes are stable in position. Moderate left and small right pleural effusions have significantly increased compared to the prior study. No appreciable pneumothorax. Bibasilar opacities are favored to reflect areas of passive subsegmental atelectasis. No evidence of pulmonary edema. Heart size is normal. Upper mediastinal contours are within normal limits. IMPRESSION: 1. Bilateral chest tubes are stable in position, however, there has been interval reaccumulation of bilateral pleural effusions, small on the right and moderate on the left, as above. Electronically Signed   By: Vinnie Langton M.D.   On: 07/19/2019 14:24    I have independently reviewed the above radiology studies  and reviewed the findings with the patient.   Recent Lab Findings: Lab Results  Component Value Date   WBC 5.7 05/03/2019   HGB 12.1 (L) 05/03/2019   HCT 34.6 (L) 05/03/2019   PLT 221 05/03/2019   GLUCOSE 102 (H) 05/03/2019   CHOL 206 (H) 04/26/2019   ALT 31 04/30/2019   AST 22 04/30/2019   NA 135 05/03/2019   K 3.8  05/03/2019   CL 95 (L) 05/03/2019   CREATININE 1.40 (H) 05/03/2019   BUN 39 (H) 05/03/2019   CO2 30 05/03/2019   INR 1.1 04/30/2019      Assessment / Plan:   Stable control of significant bilateral pleural effusions with adequate drainage, With no drainage from the left chest tube for 3 weeks I discussed with the patient removal of the tube and then follow-up chest x-ray to assure that the left pleural effusion does not increase any further in size he was agreeable with this approach  We will plan to see him back in 2 weeks with a follow-up chest x-ray.  Both he and his wife know to call and update [place] in the office about drainage amounts.  Procedure note : After confirming the patient's consent and reviewing his pertinent films the left chest area around the Pleurx catheter was cleaned with Betadine 10 mL of 1% lidocaine was infiltrated locally with a sharp scalpel a slight incision was made right at the insertion site in the tissue invading the cloth cuff was peeled away Pleurx catheter was then removed without difficulty there was old clot and fibrous material clogging the tube to nylon sutures were placed at the site to prevent leakage sterile dressing was applied the patient was instructed to leave it in place for at least 2 days.  medication Changes: No orders of the defined types were placed in this encounter.     Grace Isaac MD      [address] [place] [address] Office [phone]   Beeper [phone]  07/19/2019 3:05 PM

## 2019-07-30 ENCOUNTER — Other Ambulatory Visit: Payer: Self-pay | Admitting: Cardiothoracic Surgery

## 2019-07-30 DIAGNOSIS — J90 Pleural effusion, not elsewhere classified: Secondary | ICD-10-CM

## 2019-07-31 ENCOUNTER — Encounter: Payer: Self-pay | Admitting: Cardiothoracic Surgery

## 2019-07-31 ENCOUNTER — Ambulatory Visit
Admission: RE | Admit: 2019-07-31 | Discharge: 2019-07-31 | Disposition: A | Discharge status: Home / Self Care | Payer: 59 | Source: Ambulatory Visit | Attending: Cardiothoracic Surgery | Admitting: Cardiothoracic Surgery

## 2019-07-31 ENCOUNTER — Ambulatory Visit: Payer: 59 | Admitting: Cardiothoracic Surgery

## 2019-07-31 ENCOUNTER — Other Ambulatory Visit: Payer: Self-pay

## 2019-07-31 VITALS — BP 89/61 | HR 81 | Temp 97.7°F | Resp 16 | Ht 74.0 in | Wt 135.0 lb

## 2019-07-31 DIAGNOSIS — I5033 Acute on chronic diastolic (congestive) heart failure: Secondary | ICD-10-CM | POA: Diagnosis not present

## 2019-07-31 DIAGNOSIS — J90 Pleural effusion, not elsewhere classified: Secondary | ICD-10-CM

## 2019-07-31 NOTE — Progress Notes (Signed)
[address]       [place] [address]             [phone]      Vitaly Kingsbury [place] Medical Record #[id number] Date of Birth: [birth year]  Referring: Mariel Aloe, MD Primary Care: Azzie Roup, MD Primary Cardiologist: No primary care provider on file. Oncologist Dr. 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 SURGICAL PROCEDURE:  Placement of bilateral PleurX catheters under ultrasound and fluoroscopic guidance. SURGEON:  Lanelle Bal, MD  History of Present Illness:     Patient returns to the office today with a follow-up chest x-ray.  He originally presented to [facility] in early June 2020  with respiratory arrest, was intubated for period of time.  Prior to discharge  Pleurx catheters for large bilateral pleural effusions which had been increasing over several months or placed. Since last seen the patient notes that he feels better, much less short of breath,   He had stopped chemotherapy for his underlying amyloidosis.  He notes that over the past 3 weeks the Pleurx catheter in the left chest is dwindled down to no drainage, he continues to drain in the right Pleurx catheter every other day with approximately 600 mL, he notes that when the left Pleurx catheter stopped draining the right with increased amount.   Two weeks ago he had the left pleurx removed as the drainage had decreased to almost zero.  Past Medical History:  Diagnosis Date  . Amyloidosis ([place])   . Heart failure ([place])   . Multiple myeloma ([place])   . Pleural effusion   . Protein-calorie malnutrition, severe ([place])   . Pulmonary embolism (H[facility])      Social History   Tobacco Use  Smoking Status Never Smoker  Smokeless Tobacco Never  Used    Social History   Substance and Sexual Activity  Alcohol Use Yes   Comment: OCCASIONAL     Allergies  Allergen Reactions  . Diltiazem Hcl Other (See Comments)    CCB's are contraindicated with AL Amyloid (has not taken cardizem but)   . Metoprolol Tartrate Other (See Comments)    BB's are contraindicated with AL Amyloid     Current Outpatient Medications  Medication Sig Dispense Refill  . acetaminophen (TYLENOL) 325 MG tablet Take 325 mg by mouth every 8 (eight) hours as needed for mild pain or headache.    Marland Kitchen apixaban (ELIQUIS) 5 MG TABS tablet Take 1 tablet (5 mg total) by mouth 2 (two) times daily. 60 tablet 3  . fexofenadine (ALLEGRA) 180 MG tablet Take 180 mg by mouth daily.    . Hydrocortisone (Pawan Knechtel'S BUTT CREAM) CREA Apply 1 application topically daily as needed for irritation. 1 each 1  . magnesium oxide (MAG-OX) 400 MG tablet Take 800 mg by mouth 2 (two) times daily.    . megestrol (MEGACE) 20 MG tablet Take 20 mg by mouth 4 (four) times daily.    . Melatonin 3 MG TABS Take 3 mg by mouth at bedtime.    . midodrine (PROAMATINE) 10 MG tablet Take 1 tablet (10 mg total) by mouth 3 (three) times daily with meals. 90 tablet 0  . Multiple Vitamins-Minerals (ONE-A-DAY MENS 50+ ADVANTAGE) TABS Take 1 tablet by mouth daily with breakfast.     .  Omega-3 1000 MG CAPS Take 1,000 mg by mouth daily with breakfast.    . ondansetron (ZOFRAN) 4 MG tablet Take 4 mg by mouth every 6 (six) hours as needed for nausea.    Marland Kitchen oxyCODONE (OXY IR/ROXICODONE) 5 MG immediate release tablet Take 1 tablet (5 mg total) by mouth daily as needed (Catheter drainage). 30 tablet 0  . Polyvinyl Alcohol-Povidone (CLEAR EYES NATURAL TEARS) 5-6 MG/ML SOLN Place 1-2 drops into both eyes as needed (for dryness).    . potassium chloride SA (K-DUR) 20 MEQ tablet Take 1 tablet (20 mEq total) by mouth See admin instructions.    . torsemide (DEMADEX) 20 MG tablet Take 2 tablets (40 mg total) by mouth daily. 60  tablet 0  . Zinc Sulfate 220 (50 Zn) MG TABS Take 220 mg by mouth daily.     No current facility-administered medications for this visit.        Physical Exam: BP (!) 89/61 (BP Location: Right Arm, Patient Position: Sitting, Cuff Size: Normal)   Pulse 81   Temp 97.7 F (36.5 C)   Resp 16   Ht _0  (1.88 m)   Wt 135 lb (61.2 kg)   SpO2 98% Comment: RA  BMI 17.33 kg/m   General appearance: alert and cooperative Resp: diminished breath sounds LLL Back: symmetric, no curvature. ROM normal. No CVA tenderness. Cardio: regular rate and rhythm, S1, S2 normal, no murmur, click, rub or gallop GI: soft, non-tender; bowel sounds normal; no masses,  no organomegaly Extremities: extremities normal, atraumatic, no cyanosis or edema and Homans sign is negative, no sign of DVT Neurologic: Grossly normal Diagnostic Studies & Laboratory data:     Recent Radiology Findings:   Dg Chest 2 View  Result Date: 07/31/2019 CLINICAL DATA:  Follow-up pleural effusion EXAM: CHEST - 2 VIEW COMPARISON:  07/19/2019, 06/12/2019 FINDINGS: Right-sided pleural drainage catheter a since over the right medial chest with similar positioning of the tip over the right upper paramediastinal area. Removal of left lower chest drainage tube. No change in small right pleural effusion. No change in moderate left pleural effusion. Stable cardiomediastinal silhouette. Consolidation at the lingula and left base as before. IMPRESSION: 1. Similar positioning of right-sided chest tube without significant change in small right pleural effusion 2. Removal of left sided chest tube. Moderate left pleural effusion without significant change. Continued consolidation at the lingula and left base. Electronically Signed   By: Donavan Foil M.D.   On: 07/31/2019 16:25    I have independently reviewed the above radiology studies  and reviewed the findings with the patient.   Recent Lab Findings: Lab Results  Component Value Date   WBC 5.7  05/03/2019   HGB 12.1 (L) 05/03/2019   HCT 34.6 (L) 05/03/2019   PLT 221 05/03/2019   GLUCOSE 102 (H) 05/03/2019   CHOL 206 (H) 04/26/2019   ALT 31 04/30/2019   AST 22 04/30/2019   NA 135 05/03/2019   K 3.8 05/03/2019   CL 95 (L) 05/03/2019   CREATININE 1.40 (H) 05/03/2019   BUN 39 (H) 05/03/2019   CO2 30 05/03/2019   INR 1.1 04/30/2019      Assessment / Plan:   Stable left effusion after removal of left pleurex, clinically stable , continue to monitor, fu chest xray one month      medication Changes: No orders of the defined types were placed in this encounter.     Grace Isaac MD      [id number]  [address] [place] [address] Office [phone]   Beeper [phone]  07/31/2019 4:53 PM

## 2019-08-27 ENCOUNTER — Other Ambulatory Visit: Payer: Self-pay

## 2019-08-27 ENCOUNTER — Encounter: Payer: Self-pay | Admitting: Cardiothoracic Surgery

## 2019-08-27 DIAGNOSIS — J90 Pleural effusion, not elsewhere classified: Secondary | ICD-10-CM

## 2019-08-30 ENCOUNTER — Encounter: Payer: Self-pay | Admitting: Cardiothoracic Surgery

## 2019-08-30 ENCOUNTER — Other Ambulatory Visit: Payer: Self-pay

## 2019-08-30 ENCOUNTER — Ambulatory Visit
Admission: RE | Admit: 2019-08-30 | Discharge: 2019-08-30 | Disposition: A | Discharge status: Home / Self Care | Payer: 59 | Source: Ambulatory Visit | Attending: Cardiothoracic Surgery | Admitting: Cardiothoracic Surgery

## 2019-08-30 ENCOUNTER — Ambulatory Visit: Payer: 59 | Admitting: Cardiothoracic Surgery

## 2019-08-30 VITALS — BP 107/79 | HR 74 | Temp 97.5°F | Resp 16 | Ht 74.0 in | Wt 150.2 lb

## 2019-08-30 DIAGNOSIS — J90 Pleural effusion, not elsewhere classified: Secondary | ICD-10-CM | POA: Diagnosis not present

## 2019-08-30 DIAGNOSIS — I5033 Acute on chronic diastolic (congestive) heart failure: Secondary | ICD-10-CM | POA: Diagnosis not present

## 2019-08-30 NOTE — Progress Notes (Signed)
[address]       [place] [address]             [phone]      Koston Wescott  Medical Record #[id number] Date of Birth: 1961-10-23  Referring: Mariel Aloe, MD Primary Care: Azzie Roup, MD Primary Cardiologist: No primary care provider on file. Oncologist Dr. 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 SURGICAL PROCEDURE:  Placement of bilateral PleurX catheters under ultrasound and fluoroscopic guidance. SURGEON:  Lanelle Bal, MD  History of Present Illness:     Patient returns to the office today with a follow-up chest x-ray.  He originally presented to [facility] in early June 2020  with respiratory arrest, was intubated for period of time.  Prior to discharge  Pleurx catheters for large bilateral pleural effusions which had been increasing over several months or placed. Since last seen the patient notes that he feels better, much less short of breath,   He had stopped chemotherapy for his underlying amyloidosis.  He notes that over the past 3 weeks the Pleurx catheter in the left chest is dwindled down to no drainage, he continues to drain in the right Pleurx catheter every other day with approximately 600 mL, he notes that when the left Pleurx catheter stopped draining the right with increased amount.   4 weeks ago he had the left pleurx removed as the drainage had decreased to almost zero.  The patient has noted some increased output from the right tube some days greater than 1000 mL.  His [place] mild dose has been increased he noticed some improvement with this  Past Medical History:  Diagnosis Date  . Amyloidosis ([place])   . Heart failure ([place])   . Multiple myeloma ([place])   . Pleural effusion    . Protein-calorie malnutrition, severe ([place])   . Pulmonary embolism ([facility])      Social History   Tobacco Use  Smoking Status Never Smoker  Smokeless Tobacco Never Used    Social History   Substance and Sexual Activity  Alcohol Use Yes   Comment: OCCASIONAL     Allergies  Allergen Reactions  . Diltiazem Hcl Other (See Comments)    CCB's are contraindicated with AL Amyloid (has not taken cardizem but)   . Metoprolol Tartrate Other (See Comments)    BB's are contraindicated with AL Amyloid     Current Outpatient Medications  Medication Sig Dispense Refill  . acetaminophen (TYLENOL) 325 MG tablet Take 325 mg by mouth every 8 (eight) hours as needed for mild pain or headache.    Marland Kitchen apixaban (ELIQUIS) 5 MG TABS tablet Take 1 tablet (5 mg total) by mouth 2 (two) times daily. 60 tablet 3  . fexofenadine (ALLEGRA) 180 MG tablet Take 180 mg by mouth daily.    . Hydrocortisone (Jerald Villalona'S BUTT CREAM) CREA Apply 1 application topically daily as needed for irritation. 1 each 1  . magnesium oxide (MAG-OX) 400 MG tablet Take 800 mg by mouth 2 (two) times daily.    . megestrol (MEGACE) 20 MG tablet Take 20 mg by mouth 4 (four) times daily.    . Melatonin 3 MG TABS Take 3 mg by mouth at bedtime.    . midodrine (PROAMATINE) 10 MG tablet Take 1 tablet (10 mg total)  by mouth 3 (three) times daily with meals. 90 tablet 0  . Multiple Vitamins-Minerals (ONE-A-DAY MENS 50+ ADVANTAGE) TABS Take 1 tablet by mouth daily with breakfast.     . Omega-3 1000 MG CAPS Take 1,000 mg by mouth daily with breakfast.    . ondansetron (ZOFRAN) 4 MG tablet Take 4 mg by mouth every 6 (six) hours as needed for nausea.    Marland Kitchen oxyCODONE (OXY IR/ROXICODONE) 5 MG immediate release tablet Take 1 tablet (5 mg total) by mouth daily as needed (Catheter drainage). 30 tablet 0  . Polyvinyl Alcohol-Povidone (CLEAR EYES NATURAL TEARS) 5-6 MG/ML SOLN Place 1-2 drops into both eyes as needed (for dryness).    . potassium chloride  SA (K-DUR) 20 MEQ tablet Take 1 tablet (20 mEq total) by mouth See admin instructions.    . torsemide (DEMADEX) 100 MG tablet Take 100 mg by mouth daily.    . Zinc Sulfate 220 (50 Zn) MG TABS Take 220 mg by mouth daily.     No current facility-administered medications for this visit.        Physical Exam: BP 107/79 (BP Location: Right Arm, Patient Position: Sitting, Cuff Size: Normal)   Pulse 74   Temp (!) 97.5 F (36.4 C)   Resp 16   Ht _0  (1.88 m)   Wt 150 lb 3.2 oz (68.1 kg)   SpO2 96% Comment: RA  BMI 19.28 kg/m   General appearance: alert, cooperative and no distress Head: Normocephalic, without obvious abnormality, atraumatic Neck: no adenopathy, no carotid bruit, no JVD, supple, symmetrical, trachea midline and thyroid not enlarged, symmetric, no tenderness/mass/nodules Lymph nodes: Cervical, supraclavicular, and axillary nodes normal. Resp: diminished breath sounds LLL Cardio: regular rate and rhythm, S1, S2 normal, no murmur, click, rub or gallop Neurologic: Grossly normal   Diagnostic Studies & Laboratory data:     Recent Radiology Findings:   Dg Chest 2 View  Result Date: 08/30/2019 CLINICAL DATA:  Pleural effusion with shortness of breath EXAM: CHEST - 2 VIEW COMPARISON:  One month ago FINDINGS: Moderate left and small right pleural effusion. Tunneled pleural catheter on the right with tip in stable position. Mild bilateral interstitial thickening is stable. Normal heart size where not obscured. No visible pneumothorax. IMPRESSION: Moderate left and small right pleural effusion that is unchanged from 1 month ago. Electronically Signed   By: Monte Fantasia M.D.   On: 08/30/2019 09:26    I have independently reviewed the above radiology studies  and reviewed the findings with the patient.   Recent Lab Findings: Lab Results  Component Value Date   WBC 5.7 05/03/2019   HGB 12.1 (L) 05/03/2019   HCT 34.6 (L) 05/03/2019   PLT 221 05/03/2019   GLUCOSE 102 (H)  05/03/2019   CHOL 206 (H) 04/26/2019   ALT 31 04/30/2019   AST 22 04/30/2019   NA 135 05/03/2019   K 3.8 05/03/2019   CL 95 (L) 05/03/2019   CREATININE 1.40 (H) 05/03/2019   BUN 39 (H) 05/03/2019   CO2 30 05/03/2019   INR 1.1 04/30/2019      Assessment / Plan:   Stable left effusion after removal of left pleurex, clinically stable , continue to monitor, fu chest xray one month  Pleurx drained in the office today and 150 mL removed, will continue daily drainage    medication Changes: No orders of the defined types were placed in this encounter.     Grace Isaac MD  [address] [place] [address] Office [phone]   Beeper [phone]  08/30/2019 9:41 AM

## 2019-09-13 ENCOUNTER — Ambulatory Visit: Payer: 59 | Admitting: Cardiothoracic Surgery

## 2019-09-26 ENCOUNTER — Other Ambulatory Visit: Payer: Self-pay | Admitting: Cardiothoracic Surgery

## 2019-09-26 DIAGNOSIS — J90 Pleural effusion, not elsewhere classified: Secondary | ICD-10-CM

## 2019-09-27 ENCOUNTER — Other Ambulatory Visit: Payer: Self-pay

## 2019-09-27 ENCOUNTER — Ambulatory Visit (INDEPENDENT_AMBULATORY_CARE_PROVIDER_SITE_OTHER): Payer: 59 | Admitting: Cardiothoracic Surgery

## 2019-09-27 ENCOUNTER — Ambulatory Visit
Admission: RE | Admit: 2019-09-27 | Discharge: 2019-09-27 | Disposition: A | Discharge status: Home / Self Care | Payer: 59 | Source: Ambulatory Visit | Attending: Cardiothoracic Surgery | Admitting: Cardiothoracic Surgery

## 2019-09-27 VITALS — BP 84/62 | HR 69 | Temp 97.7°F | Resp 20 | Ht 74.0 in | Wt 150.0 lb

## 2019-09-27 DIAGNOSIS — J90 Pleural effusion, not elsewhere classified: Secondary | ICD-10-CM

## 2019-09-27 DIAGNOSIS — I5033 Acute on chronic diastolic (congestive) heart failure: Secondary | ICD-10-CM

## 2019-09-27 NOTE — Progress Notes (Signed)
[address]       [place] [address]             [phone]      Jerame Gindlesperger [facility] Medical Record #[id number] Date of Birth: [birth year]  Referring: Mariel Aloe, MD Primary Care: Azzie Roup, MD Primary Cardiologist: No primary care provider on file. Oncologist Dr. 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 SURGICAL PROCEDURE:  Placement of bilateral PleurX catheters under ultrasound and fluoroscopic guidance. SURGEON:  Lanelle Bal, MD  History of Present Illness:     Patient returns to the office today with a follow-up chest x-ray.  Currently he is draining 850 mL a day from his right Pleurx catheter the left Pleurx catheter became nonfunctional and was removed moderate left pleural effusion appears unchanged.    He originally presented to [facility] in early June 2020  with respiratory arrest, was intubated for period of time.  Prior to discharge  Pleurx catheters for large bilateral pleural effusions which had been increasing over several months or placed. Since last seen the patient notes that he feels better, much less short of breath,   He had stopped chemotherapy for his underlying amyloidosis.  He notes that over the past 3 weeks the Pleurx catheter in the left chest is dwindled down to no drainage, he continues to drain in the right Pleurx catheter every other day with approximately 600 mL, he notes that when the left Pleurx catheter stopped draining the right with increased amount.     Past Medical History:  Diagnosis Date  . Amyloidosis ([place])   . Heart failure ([place])   . Multiple myeloma ([place])   . Pleural effusion   . Protein-calorie malnutrition, severe ([place])   . Pulmonary embolism ([facility])       Social History   Tobacco Use  Smoking Status Never Smoker  Smokeless Tobacco Never Used    Social History   Substance and Sexual Activity  Alcohol Use Yes   Comment: OCCASIONAL     Allergies  Allergen Reactions  . Diltiazem Hcl Other (See Comments)    CCB's are contraindicated with AL Amyloid (has not taken cardizem but)   . Metoprolol Tartrate Other (See Comments)    BB's are contraindicated with AL Amyloid     Current Outpatient Medications  Medication Sig Dispense Refill  . acetaminophen (TYLENOL) 325 MG tablet Take 325 mg by mouth every 8 (eight) hours as needed for mild pain or headache.    Marland Kitchen apixaban (ELIQUIS) 5 MG TABS tablet Take 1 tablet (5 mg total) by mouth 2 (two) times daily. 60 tablet 3  . fexofenadine (ALLEGRA) 180 MG tablet Take 180 mg by mouth daily.    . Hydrocortisone (Shaquira Moroz'S BUTT CREAM) CREA Apply 1 application topically daily as needed for irritation. 1 each 1  . magnesium oxide (MAG-OX) 400 MG tablet Take 800 mg by mouth 2 (two) times daily.    . megestrol (MEGACE) 20 MG tablet Take 20 mg by mouth 4 (four) times daily.    . Melatonin 3 MG TABS Take 3 mg by mouth at bedtime.    . midodrine (PROAMATINE) 10 MG tablet Take 1 tablet (10 mg total) by mouth 3 (three) times daily with meals. 90 tablet 0  . Multiple Vitamins-Minerals (ONE-A-DAY MENS  50+ ADVANTAGE) TABS Take 1 tablet by mouth daily with breakfast.     . Omega-3 1000 MG CAPS Take 1,000 mg by mouth daily with breakfast.    . ondansetron (ZOFRAN) 4 MG tablet Take 4 mg by mouth every 6 (six) hours as needed for nausea.    Marland Kitchen oxyCODONE (OXY IR/ROXICODONE) 5 MG immediate release tablet Take 1 tablet (5 mg total) by mouth daily as needed (Catheter drainage). 30 tablet 0  . Polyvinyl Alcohol-Povidone (CLEAR EYES NATURAL TEARS) 5-6 MG/ML SOLN Place 1-2 drops into both eyes as needed (for dryness).    . potassium chloride SA (K-DUR) 20 MEQ tablet Take 1 tablet (20 mEq total) by mouth See admin  instructions.    . torsemide (DEMADEX) 100 MG tablet Take 100 mg by mouth daily.    . Zinc Sulfate 220 (50 Zn) MG TABS Take 220 mg by mouth daily.     No current facility-administered medications for this visit.        Physical Exam: BP (!) 84/62   Pulse 69   Temp 97.7 F (36.5 C) (Skin)   Resp 20   Ht 6' 2"  (1.88 m)   Wt 68 kg   SpO2 96% Comment: RA  BMI 19.26 kg/m   General appearance: alert, cooperative and no distress Lymph nodes: Cervical, supraclavicular, and axillary nodes normal. Resp: diminished breath sounds LLL Cardio: regular rate and rhythm, S1, S2 normal, no murmur, click, rub or gallop Extremities: extremities normal, atraumatic, no cyanosis or edema and Homans sign is negative, no sign of DVT Neurologic: Grossly normal   Diagnostic Studies & Laboratory data:     Recent Radiology Findings:   Dg Chest 2 View  Result Date: 09/27/2019 CLINICAL DATA:  Bilateral pleural effusions. Right chest tube. EXAM: CHEST - 2 VIEW COMPARISON:  Chest x-ray dated 08/30/2019 FINDINGS: There has been a slight increase in the moderate left pleural effusion. There has been a appreciable decrease in the small right pleural effusion. Right chest tube is unchanged. No pneumothorax. Heart size and vascularity are normal. No bone abnormality. IMPRESSION: 1. Slight increase in the moderate left pleural effusion. 2. No pneumothorax. 3. Decreased small right pleural effusion. Electronically Signed   By: Lorriane Shire M.D.   On: 09/27/2019 14:49    I have independently reviewed the above radiology studies  and reviewed the findings with the patient.   Recent Lab Findings: Lab Results  Component Value Date   WBC 5.7 05/03/2019   HGB 12.1 (L) 05/03/2019   HCT 34.6 (L) 05/03/2019   PLT 221 05/03/2019   GLUCOSE 102 (H) 05/03/2019   CHOL 206 (H) 04/26/2019   ALT 31 04/30/2019   AST 22 04/30/2019   NA 135 05/03/2019   K 3.8 05/03/2019   CL 95 (L) 05/03/2019   CREATININE 1.40 (H)  05/03/2019   BUN 39 (H) 05/03/2019   CO2 30 05/03/2019   INR 1.1 04/30/2019      Assessment / Plan:   Patient appears stable the right Pleurx catheter is functioning and keeping him with decreased symptoms of dyspnea on exertion Chest x-ray done today shows adequate control of the effusion We will plan follow-up chest x-ray in 1 month, patient and his wife are aware to call should he have changes in his breathing or amount of drainage from the right suddenly changes.   medication Changes: No orders of the defined types were placed in this encounter.     Grace Isaac MD  [address] [place] [address] Office [phone]   Beeper [phone]  09/27/2019 5:47 PM

## 2019-10-04 ENCOUNTER — Ambulatory Visit: Payer: 59 | Admitting: Cardiothoracic Surgery

## 2019-10-24 ENCOUNTER — Other Ambulatory Visit: Payer: Self-pay | Admitting: Cardiothoracic Surgery

## 2019-10-24 DIAGNOSIS — J90 Pleural effusion, not elsewhere classified: Secondary | ICD-10-CM

## 2019-10-25 ENCOUNTER — Ambulatory Visit: Payer: 59 | Admitting: Cardiothoracic Surgery

## 2019-10-25 ENCOUNTER — Ambulatory Visit
Admission: RE | Admit: 2019-10-25 | Discharge: 2019-10-25 | Disposition: A | Discharge status: Home / Self Care | Payer: 59 | Source: Ambulatory Visit | Attending: Cardiothoracic Surgery | Admitting: Cardiothoracic Surgery

## 2019-10-25 ENCOUNTER — Other Ambulatory Visit: Payer: Self-pay

## 2019-10-25 VITALS — BP 92/65 | HR 72 | Temp 98.1°F | Resp 20 | Ht 74.0 in | Wt 151.0 lb

## 2019-10-25 DIAGNOSIS — J90 Pleural effusion, not elsewhere classified: Secondary | ICD-10-CM

## 2019-10-25 NOTE — Progress Notes (Signed)
[address]       [place] [address]             [phone]      Olvin Strong [place] Medical Record #[id number] Date of Birth: June 03, 1961  Referring: Mariel Aloe, MD Primary Care: Azzie Roup, MD Primary Cardiologist: No primary care provider on file. Oncologist Dr. 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 SURGICAL PROCEDURE:  Placement of bilateral PleurX catheters under ultrasound and fluoroscopic guidance. SURGEON:  Lanelle Bal, MD  History of Present Illness:     Patient returns to the office today with a follow-up chest x-ray.  Currently he is draining 800- 900 mL a day from his right Pleurx catheter the left Pleurx catheter became nonfunctional and was removed moderate left pleural effusion appears unchanged.  He recently was seen by cardiology this torsemide was increased to try to help with the lower extremity edema, but there was not much difference and he went back to his usual dose.     He originally presented to [facility] in early June 2020  with respiratory arrest, was intubated for period of time.  Prior to discharge  Pleurx catheters for large bilateral pleural effusions which had been increasing over several months or placed. Since last seen the patient notes that he feels better, much less short of breath,   He had stopped chemotherapy for his underlying amyloidosis.   Past Medical History:  Diagnosis Date  . Amyloidosis ([place])   . Heart failure ([place])   . Multiple myeloma ([place])   . Pleural effusion   . Protein-calorie malnutrition, severe ([place])   . Pulmonary embolism ([facility])      Social History   Tobacco Use  Smoking Status Never Smoker  Smokeless Tobacco Never Used    Social  History   Substance and Sexual Activity  Alcohol Use Yes   Comment: OCCASIONAL     Allergies  Allergen Reactions  . Diltiazem Other (See Comments)    CCB's contraindicated w/ AL Amyloid  . Metoprolol Tartrate Other (See Comments)    BB's are contraindicated with AL Amyloid     Current Outpatient Medications  Medication Sig Dispense Refill  . acetaminophen (TYLENOL) 325 MG tablet Take 325 mg by mouth every 8 (eight) hours as needed for mild pain or headache.    Marland Kitchen apixaban (ELIQUIS) 5 MG TABS tablet Take 1 tablet (5 mg total) by mouth 2 (two) times daily. 60 tablet 3  . fexofenadine (ALLEGRA) 180 MG tablet Take 180 mg by mouth daily.    . Hydrocortisone (Taquisha Phung'S BUTT CREAM) CREA Apply 1 application topically daily as needed for irritation. 1 each 1  . magnesium oxide (MAG-OX) 400 MG tablet Take 800 mg by mouth 2 (two) times daily.    . megestrol (MEGACE) 20 MG tablet Take 20 mg by mouth 4 (four) times daily.    . Melatonin 3 MG TABS Take 3 mg by mouth at bedtime.    . midodrine (PROAMATINE) 10 MG tablet Take 1 tablet (10 mg total) by mouth 3 (three) times daily with meals. 90 tablet 0  . Multiple Vitamins-Minerals (ONE-A-DAY MENS 50+ ADVANTAGE) TABS Take 1 tablet by mouth daily with breakfast.     . Omega-3 1000 MG CAPS Take 1,000 mg by mouth daily with  breakfast.    . ondansetron (ZOFRAN) 4 MG tablet Take 4 mg by mouth every 6 (six) hours as needed for nausea.    Marland Kitchen oxyCODONE (OXY IR/ROXICODONE) 5 MG immediate release tablet Take 1 tablet (5 mg total) by mouth daily as needed (Catheter drainage). 30 tablet 0  . Polyvinyl Alcohol-Povidone (CLEAR EYES NATURAL TEARS) 5-6 MG/ML SOLN Place 1-2 drops into both eyes as needed (for dryness).    . potassium chloride SA (K-DUR) 20 MEQ tablet Take 1 tablet (20 mEq total) by mouth See admin instructions.    . torsemide (DEMADEX) 100 MG tablet Take 100 mg by mouth daily.    . Zinc Sulfate 220 (50 Zn) MG TABS Take 220 mg by mouth daily.     No  current facility-administered medications for this visit.       Physical Exam: BP 92/65 (BP Location: Right Arm)   Pulse 72   Temp 98.1 F (36.7 C) (Skin)   Resp 20   Ht 6' 2" (1.88 m)   Wt 68.5 kg   SpO2 96% Comment: RA  BMI 19.39 kg/m   General appearance: alert, cooperative and no distress Lymph nodes: Cervical, supraclavicular, and axillary nodes normal. Resp: diminished breath sounds LLL Cardio: regular rate and rhythm, S1, S2 normal, no murmur, click, rub or gallop Extremities: extremities normal, atraumatic, no cyanosis or edema and Homans sign is negative, no sign of DVT Neurologic: Grossly normal   Diagnostic Studies & Laboratory data:     Recent Radiology Findings:   DG Chest 2 View  Result Date: 10/25/2019 CLINICAL DATA:  Pleural effusion. EXAM: CHEST - 2 VIEW COMPARISON:  September 27, 2019. FINDINGS: Stable cardiomediastinal silhouette. No pneumothorax is noted. Stable position of right-sided pleural drainage catheter. Minimal right pleural effusion is noted. Stable moderate size left pleural effusion is noted with probable underlying atelectasis. Bony thorax is unremarkable. IMPRESSION: 1. Stable moderate size left pleural effusion with probable underlying atelectasis. Stable position of right-sided pleural drainage catheter. No pneumothorax is noted. 2. Stable minimal right pleural effusion. Electronically Signed   By: Marijo Conception M.D.   On: 10/25/2019 14:20    I have independently reviewed the above radiology studies  and reviewed the findings with the patient.   Recent Lab Findings: Lab Results  Component Value Date   WBC 5.7 05/03/2019   HGB 12.1 (L) 05/03/2019   HCT 34.6 (L) 05/03/2019   PLT 221 05/03/2019   GLUCOSE 102 (H) 05/03/2019   CHOL 206 (H) 04/26/2019   ALT 31 04/30/2019   AST 22 04/30/2019   NA 135 05/03/2019   K 3.8 05/03/2019   CL 95 (L) 05/03/2019   CREATININE 1.40 (H) 05/03/2019   BUN 39 (H) 05/03/2019   CO2 30 05/03/2019   INR  1.1 04/30/2019      Assessment / Plan:   The patient's right Pleurx catheter continues to function keep his effusions controlled especially on the right.  Still has a left pleural effusion but has not changed over the last month.  Symptomatically the patient remains the same. We will continue with current therapy draining catheter, will plan a follow-up chest x-ray in 6 weeks or sooner as necessary.  His wife is aware to call at any time should she have questions about drainage  medication Changes: No orders of the defined types were placed in this encounter.     Grace Isaac MD      [address] [place] [address] Office [phone]  Beeper [phone]  10/25/2019 3:24 PM

## 2019-11-16 DEATH — deceased

## 2019-12-05 ENCOUNTER — Other Ambulatory Visit: Payer: Self-pay | Admitting: Cardiothoracic Surgery

## 2019-12-05 DIAGNOSIS — J90 Pleural effusion, not elsewhere classified: Secondary | ICD-10-CM

## 2019-12-06 ENCOUNTER — Ambulatory Visit: Payer: 59 | Admitting: Cardiothoracic Surgery

## 2019-12-07 ENCOUNTER — Ambulatory Visit: Payer: Self-pay | Admitting: Cardiothoracic Surgery

## 2021-03-19 IMAGING — CT CT HEAD WITHOUT CONTRAST
4 series · 16 of 47 positions shown, 18 images · non-contrast
Comparison: None.

CLINICAL DATA: Shortness of breath, became unresponsive on the way
home from heart failure clinic.

EXAM:
CT HEAD WITHOUT CONTRAST
TECHNIQUE: Contiguous axial images were obtained from the base of the skull
through the vertex without intravenous contrast.

[Series 3: head without · axial · non-contrast · 0.48mm/px · z∈[-609,-484]mm · 6 of 36 slices shown, 8 images]
[im 6/36  brain]
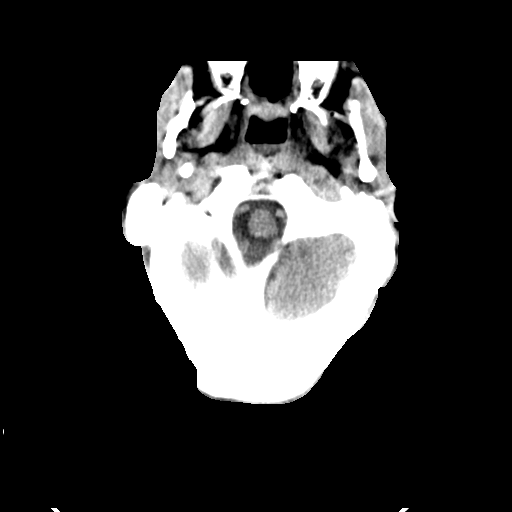
[im 6/36  bone]
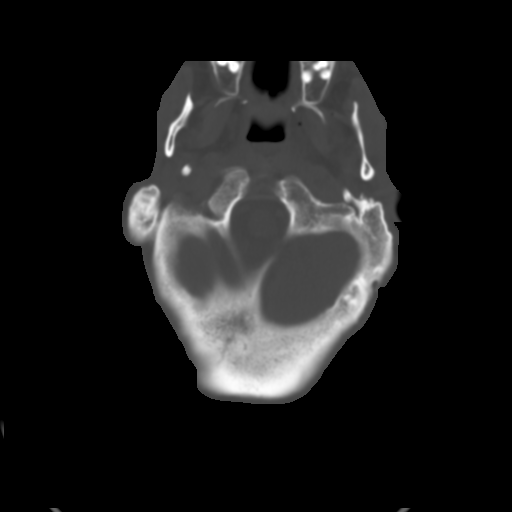
[im 11/36  brain]
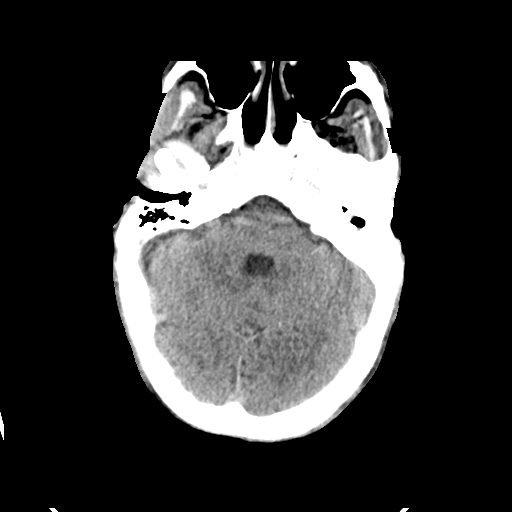
[im 16/36  brain]
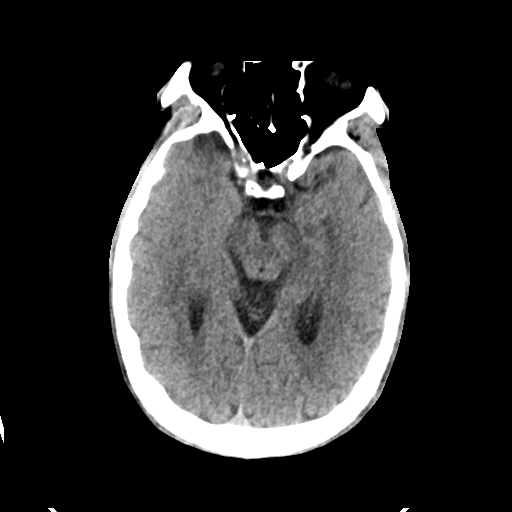
[im 21/36  brain]
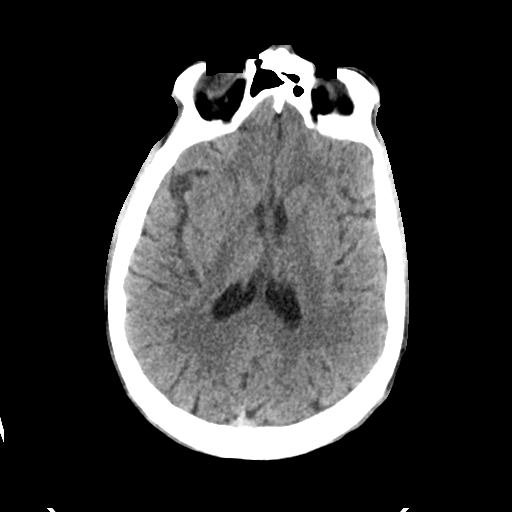
[im 26/36  brain]
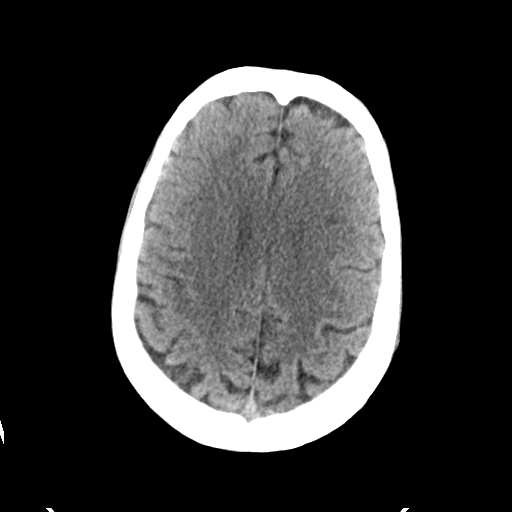
[im 26/36  bone]
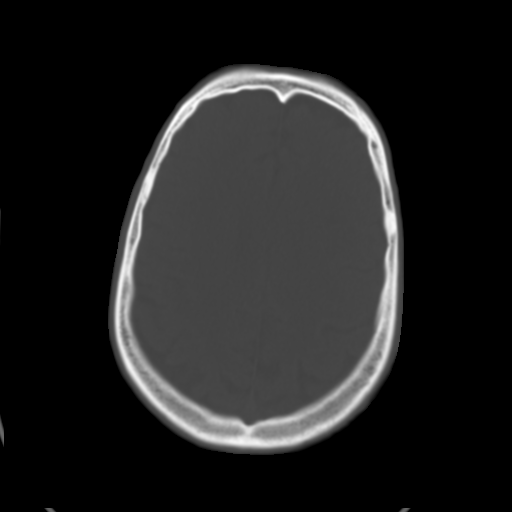
[im 31/36  brain]
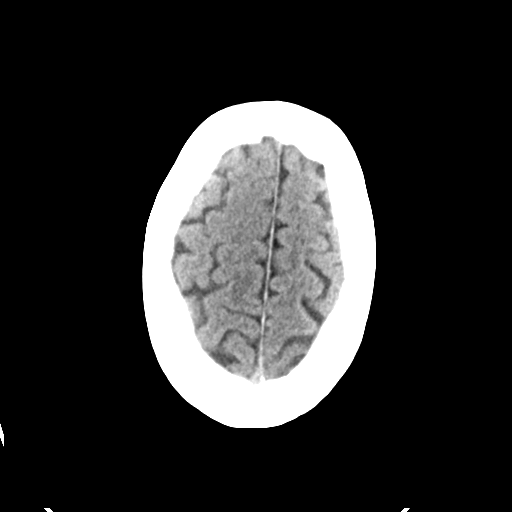

[Series 4: head bone · axial · 0.48mm/px · z∈[-618,-556]mm · 4 of 94 slices shown]
[im 9/94  bone]
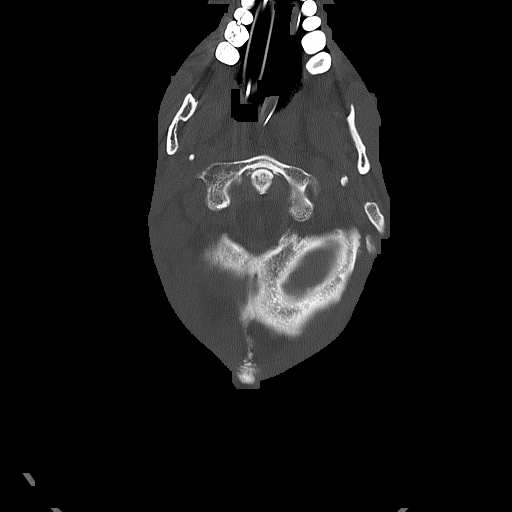
[im 18/94  bone]
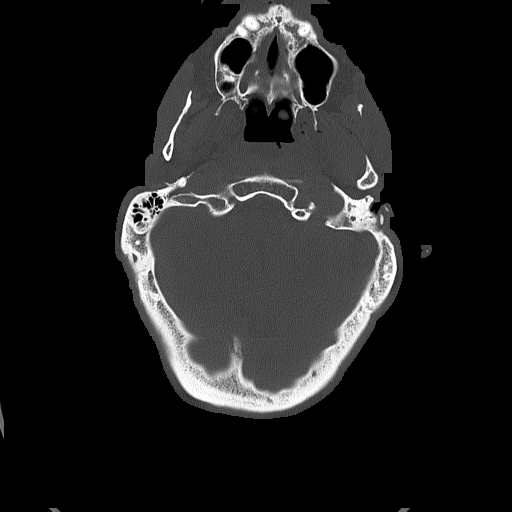
[im 32/94  bone]
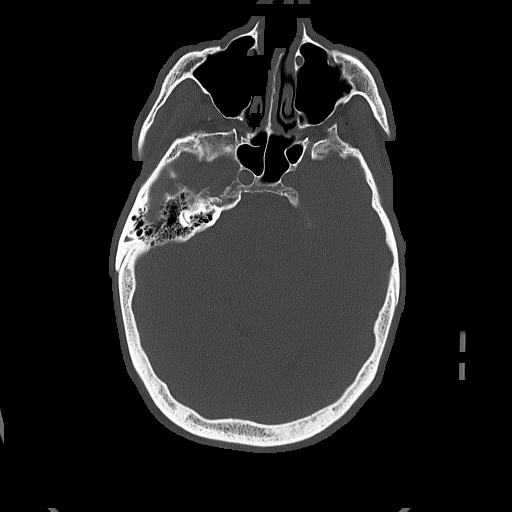
[im 40/94  bone]
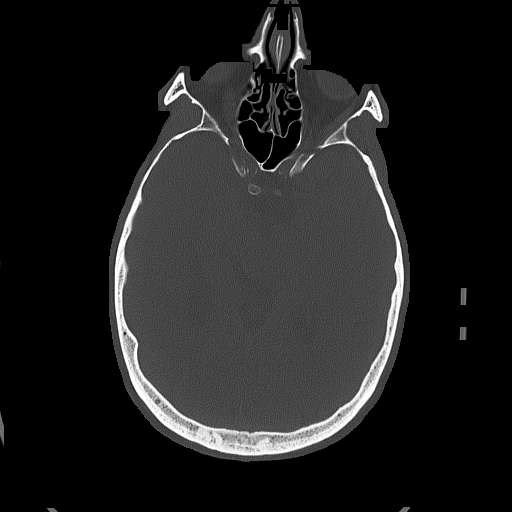

[Series 5: head without cor · coronal · non-contrast · 0.32mm/px · 3 of 72 slices shown]
[im 24/72  brain]
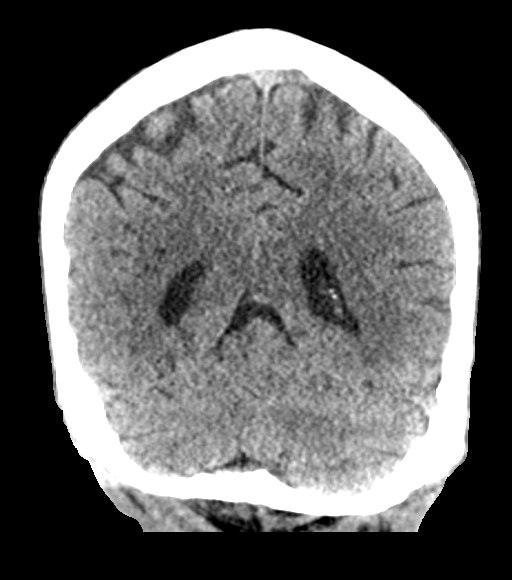
[im 32/72  brain]
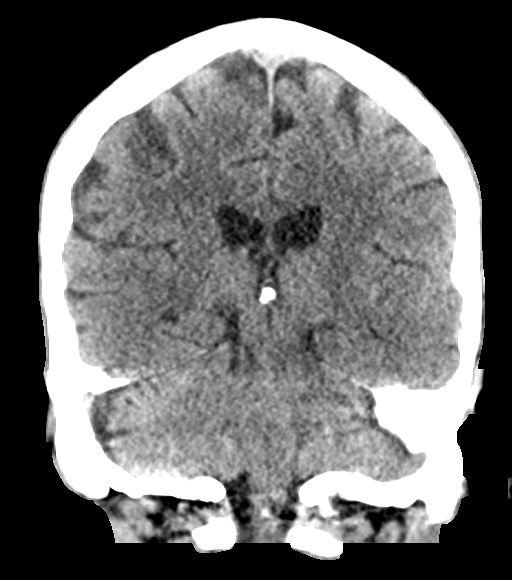
[im 40/72  brain]
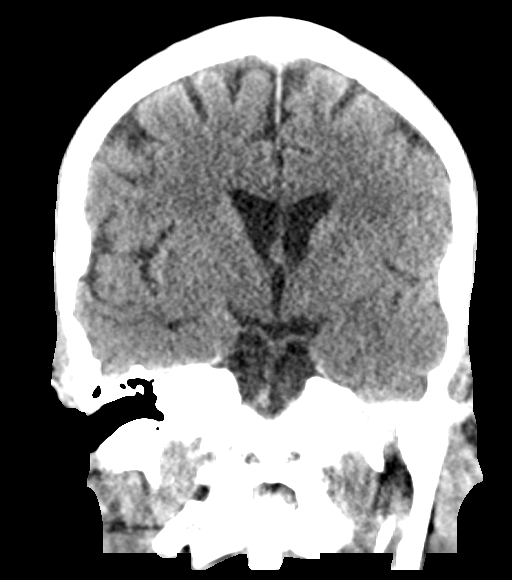

[Series 6: head without sag · sagittal · non-contrast · 0.38mm/px · 3 of 63 slices shown]
[im 21/63  brain]
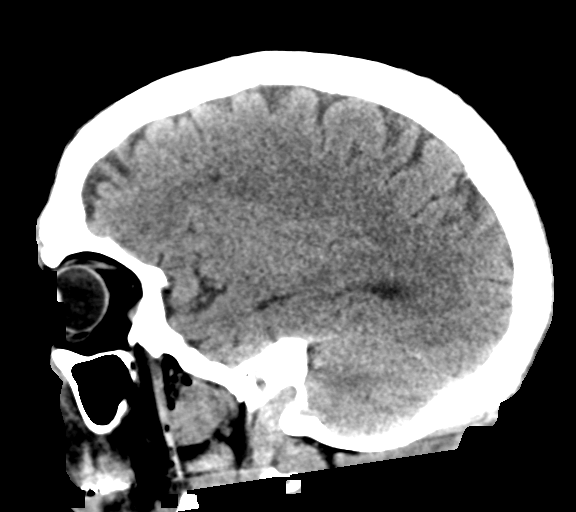
[im 32/63  brain]
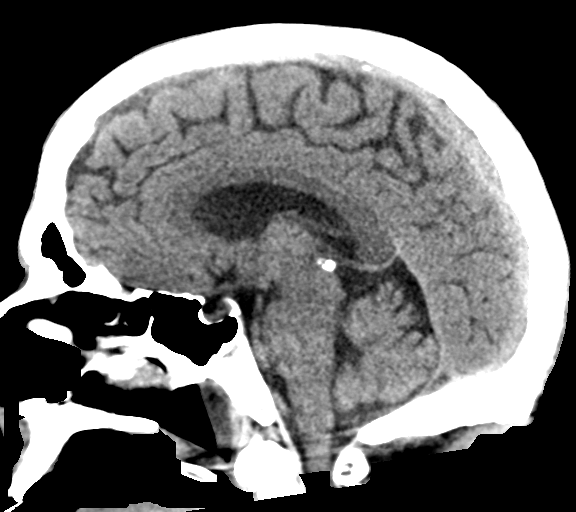
[im 42/63  brain]
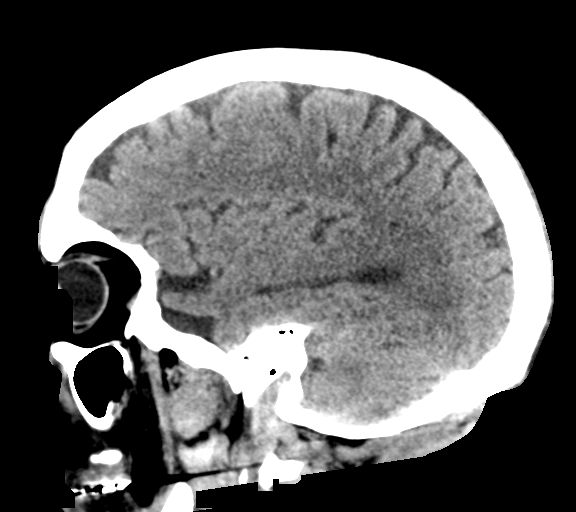

[16 of 47 positions shown; findings below may reference images not displayed]

FINDINGS: Brain: The brainstem, cerebellum, cerebral peduncles, thalami, basal
ganglia, basilar cisterns, and ventricular system appear within
normal limits. No intracranial hemorrhage, mass lesion, or acute
CVA.

Vascular: Unremarkable

Skull: Left mastoidectomy.  Gas tracks along the left facial

Sinuses/Orbits: Left mastoidectomy. The remaining paranasal sinuses
appear clear.

Other: There is gas tracking along the left and to a lesser extent
pterygoid musculature along the left masseter muscle and lateral to
the left mandibular condyle. Air fluid level in the nasopharynx.
Patient is orally intubated.
IMPRESSION: 1. No acute intracranial findings.
2. There is gas tracking along the margins the left pterygoid muscle
and along the posterior margin of the left masseter muscle, cause
uncertain, but conceivably related to intubation. I do not see
adjacent facial fractures. Patient does have a left mastoidectomy.

## 2021-03-19 IMAGING — CT CT CHEST WITH CONTRAST
2 of 3 series · 12 of 36 positions shown, 15 images · IV contrast (omnipaque)
Comparison: 04/23/2019 chest radiograph
COMPARISON: 04/23/2019 chest radiograph

Addendum:
CLINICAL DATA: Shortness of breath, patient became unresponsive
after leaving heart failure clinic.

EXAM:
CT CHEST WITH CONTRAST
TECHNIQUE: Multidetector CT imaging of the chest was performed during
intravenous contrast administration.
CONTRAST:  75mL OMNIPAQUE IOHEXOL 300 MG/ML  SOLN

[Series 3: chest with 2mm st · axial · 0.77mm/px · z∈[-1068,-760]mm · 9 of 182 slices shown, 12 images]
[im 14/182  mediastinal]
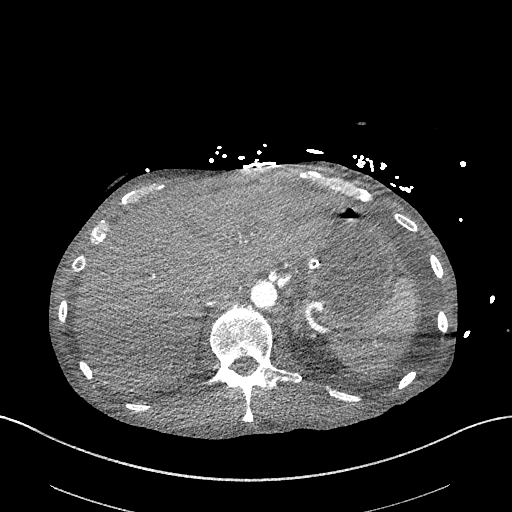
[im 14/182  lung]
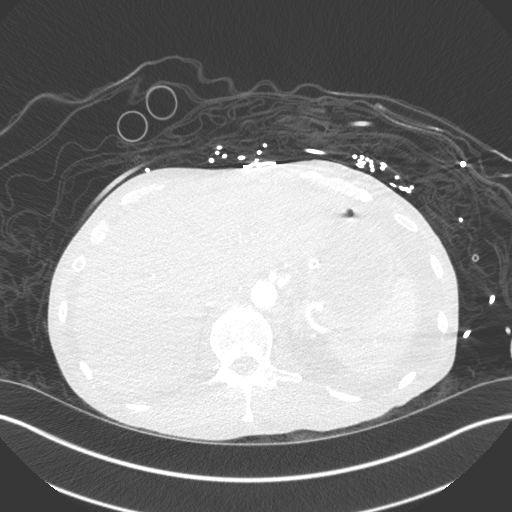
[im 34/182  lung]
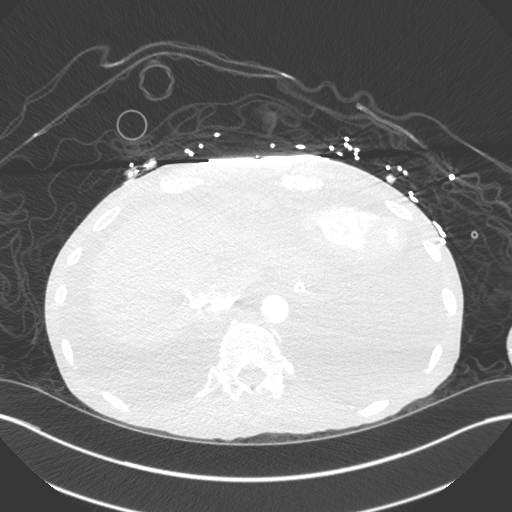
[im 54/182  lung]
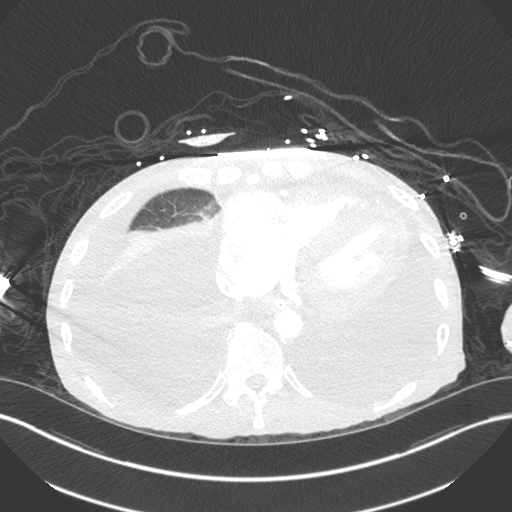
[im 74/182  lung]
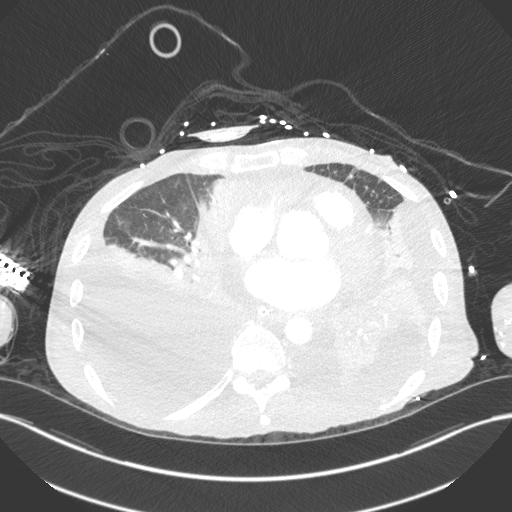
[im 94/182  mediastinal]
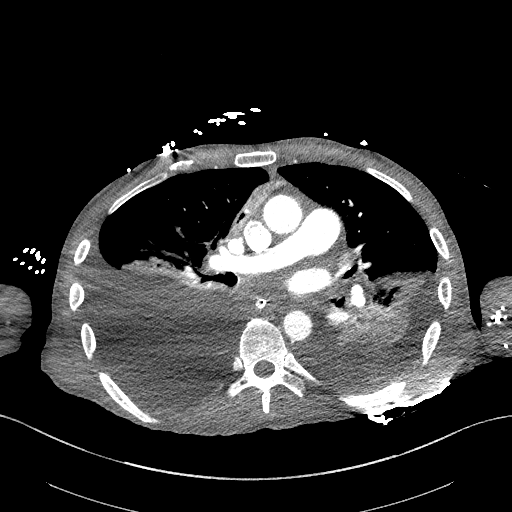
[im 94/182  lung]
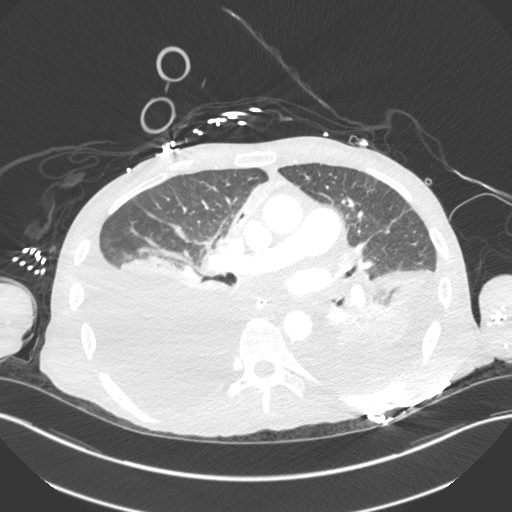
[im 108/182  lung]
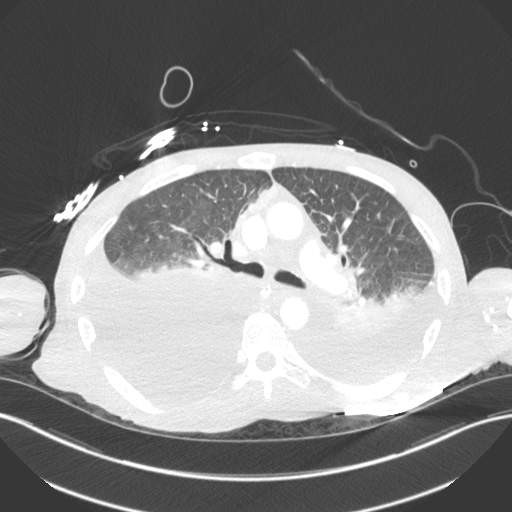
[im 128/182  lung]
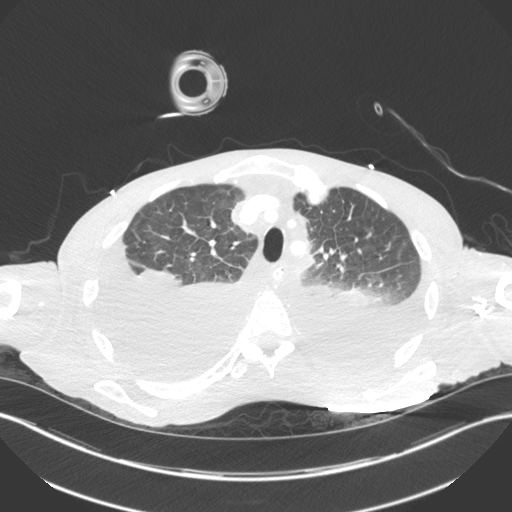
[im 148/182  lung]
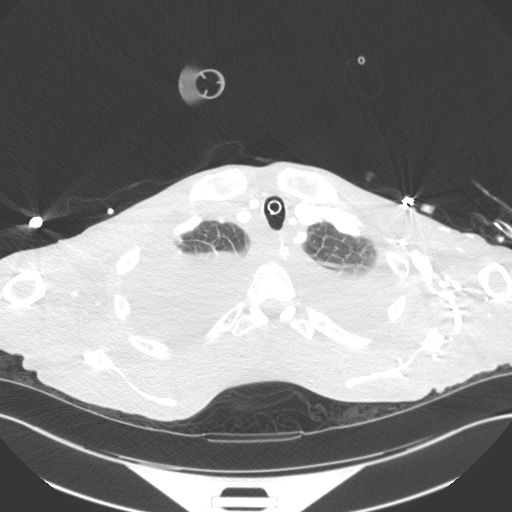
[im 168/182  mediastinal]
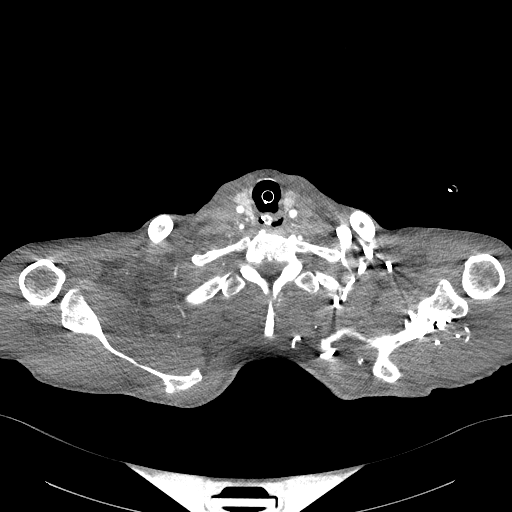
[im 168/182  lung]
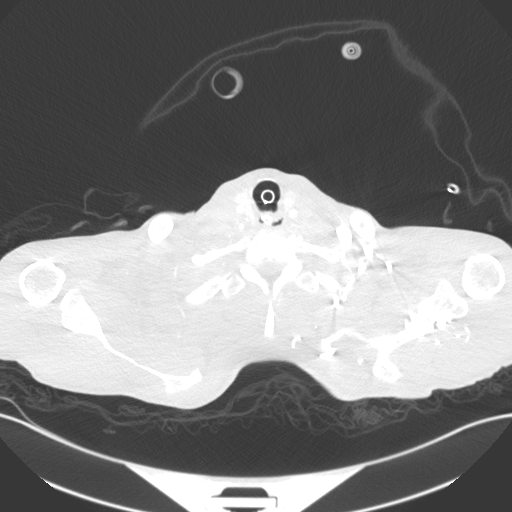

[Series 5: chest with 3mm st cor · coronal · 0.63mm/px · 3 of 99 slices shown]
[im 20/99  lung]
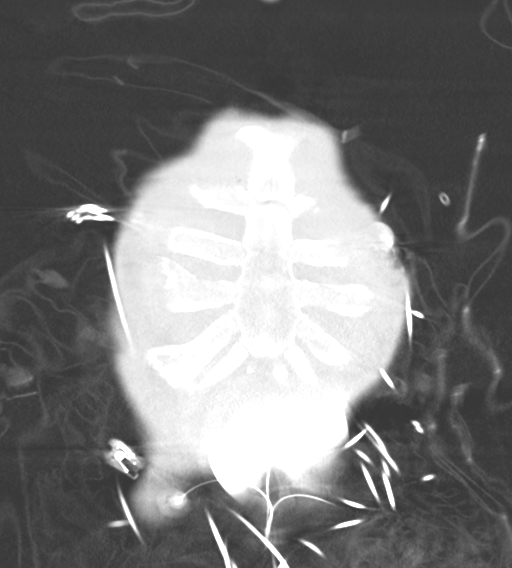
[im 40/99  lung]
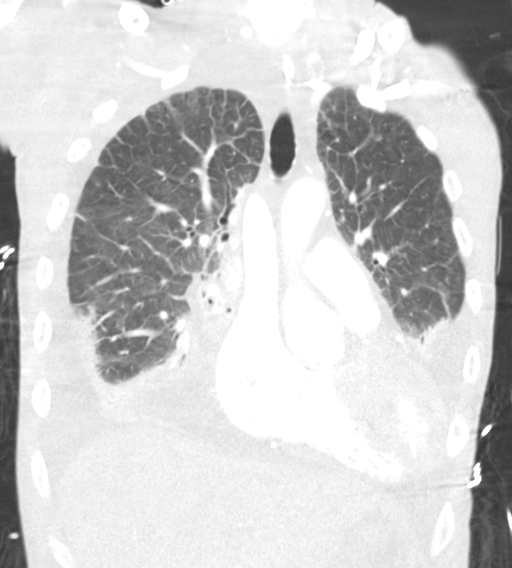
[im 59/99  lung]
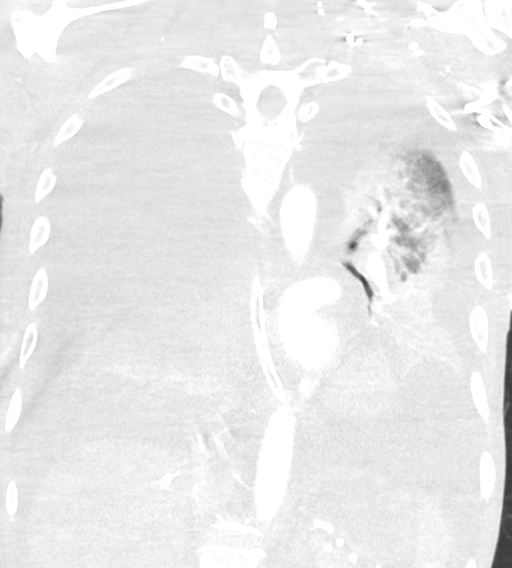

[12 of 36 positions shown; findings below may reference images not displayed]

FINDINGS: Cardiovascular: Mild cardiomegaly. Left ventricular hypertrophy with
the interventricular septal thickness of 2.2 cm.

Today's exam was not performed as a CT angiogram. Nevertheless,
there is discernible filling defect compatible with thrombus in the
left upper lobe pulmonary artery into a lesser extent in the right
lower lobe pulmonary artery as shown for example on images 79
through 87 of series 3. Clot burden is small to moderate.

Mediastinum/Nodes: Diffuse edema in the mediastinal adipose tissues.
Endotracheal tube satisfactorily positioned. Nasogastric tube enters
the stomach.

Lungs/Pleura: Large bilateral pleural effusions, over 50% of
hemithoracic volume bilaterally. Secondary pulmonary lobular
interstitial accentuation especially in the lung apices compatible
with pulmonary edema. Extensive passive atelectasis.

Upper Abdomen: Edema in the upper abdominal adipose tissue
compatible with widespread third spacing.

Musculoskeletal: Diffuse subcutaneous edema compatible with
widespread third spacing of fluid.

Age indeterminate superior endplate wedging at T10. Vacuum disc
phenomenon at T9-10.
IMPRESSION: 1. Acute pulmonary embolus involving the right upper lobe and right
lower lobe. Clot burden is small to moderate. Positive for acute PE
with CT evidence of right heart strain (RV/LV Ratio = 1.1)
consistent with at least submassive (intermediate risk) PE. The
presence of right heart strain has been associated with an increased
risk of morbidity and mortality. Please activate Code PE by paging
[phone].
2. Left ventricular hypertrophy and mild cardiomegaly.
3. Large bilateral pleural effusions with passive atelectasis. Only
a minority of the lung is aerated. This may reduce overall
respiratory reserve.
4. Third spacing of fluid with edema in mediastinal and subcutaneous
tissues. Secondary pulmonary lobular interstitial accentuation in
the lungs compatible with interstitial pulmonary edema.
5. Age-indeterminate mild superior endplate compression at T10.

Radiology assistant personnel have been notified to put me in
telephone contact with the referring physician or the referring
physician's clinical representative in order to discuss these
findings. Once this communication is established I will issue an
addendum to this report for documentation purposes.

ADDENDUM:
The original report was by Dr. Mimoh Rijo. The following
addendum is by Dr. Mimoh Rijo:

Critical Value/emergent results were called by telephone at the time
of interpretation on 04/23/2019 at [DATE] to Dr. BJ, who verbally
acknowledged these results.

*** End of Addendum ***
FINDINGS: Cardiovascular: Mild cardiomegaly. Left ventricular hypertrophy with
the interventricular septal thickness of 2.2 cm.

Today's exam was not performed as a CT angiogram. Nevertheless,
there is discernible filling defect compatible with thrombus in the
left upper lobe pulmonary artery into a lesser extent in the right
lower lobe pulmonary artery as shown for example on images 79
through 87 of series 3. Clot burden is small to moderate.

Mediastinum/Nodes: Diffuse edema in the mediastinal adipose tissues.
Endotracheal tube satisfactorily positioned. Nasogastric tube enters
the stomach.

Lungs/Pleura: Large bilateral pleural effusions, over 50% of
hemithoracic volume bilaterally. Secondary pulmonary lobular
interstitial accentuation especially in the lung apices compatible
with pulmonary edema. Extensive passive atelectasis.

Upper Abdomen: Edema in the upper abdominal adipose tissue
compatible with widespread third spacing.

Musculoskeletal: Diffuse subcutaneous edema compatible with
widespread third spacing of fluid.

Age indeterminate superior endplate wedging at T10. Vacuum disc
phenomenon at T9-10.
IMPRESSION: 1. Acute pulmonary embolus involving the right upper lobe and right
lower lobe. Clot burden is small to moderate. Positive for acute PE
with CT evidence of right heart strain (RV/LV Ratio = 1.1)
consistent with at least submassive (intermediate risk) PE. The
presence of right heart strain has been associated with an increased
risk of morbidity and mortality. Please activate Code PE by paging
[phone].
2. Left ventricular hypertrophy and mild cardiomegaly.
3. Large bilateral pleural effusions with passive atelectasis. Only
a minority of the lung is aerated. This may reduce overall
respiratory reserve.
4. Third spacing of fluid with edema in mediastinal and subcutaneous
tissues. Secondary pulmonary lobular interstitial accentuation in
the lungs compatible with interstitial pulmonary edema.
5. Age-indeterminate mild superior endplate compression at T10.

Radiology assistant personnel have been notified to put me in
telephone contact with the referring physician or the referring
physician's clinical representative in order to discuss these
findings. Once this communication is established I will issue an
addendum to this report for documentation purposes.

## 2021-03-20 IMAGING — DX PORTABLE CHEST - 1 VIEW
1 series · 1 of 1 positions shown · non-contrast
Comparison: 04/23/2019

CLINICAL DATA: Respiratory failure

EXAM:
PORTABLE CHEST 1 VIEW

[chest]
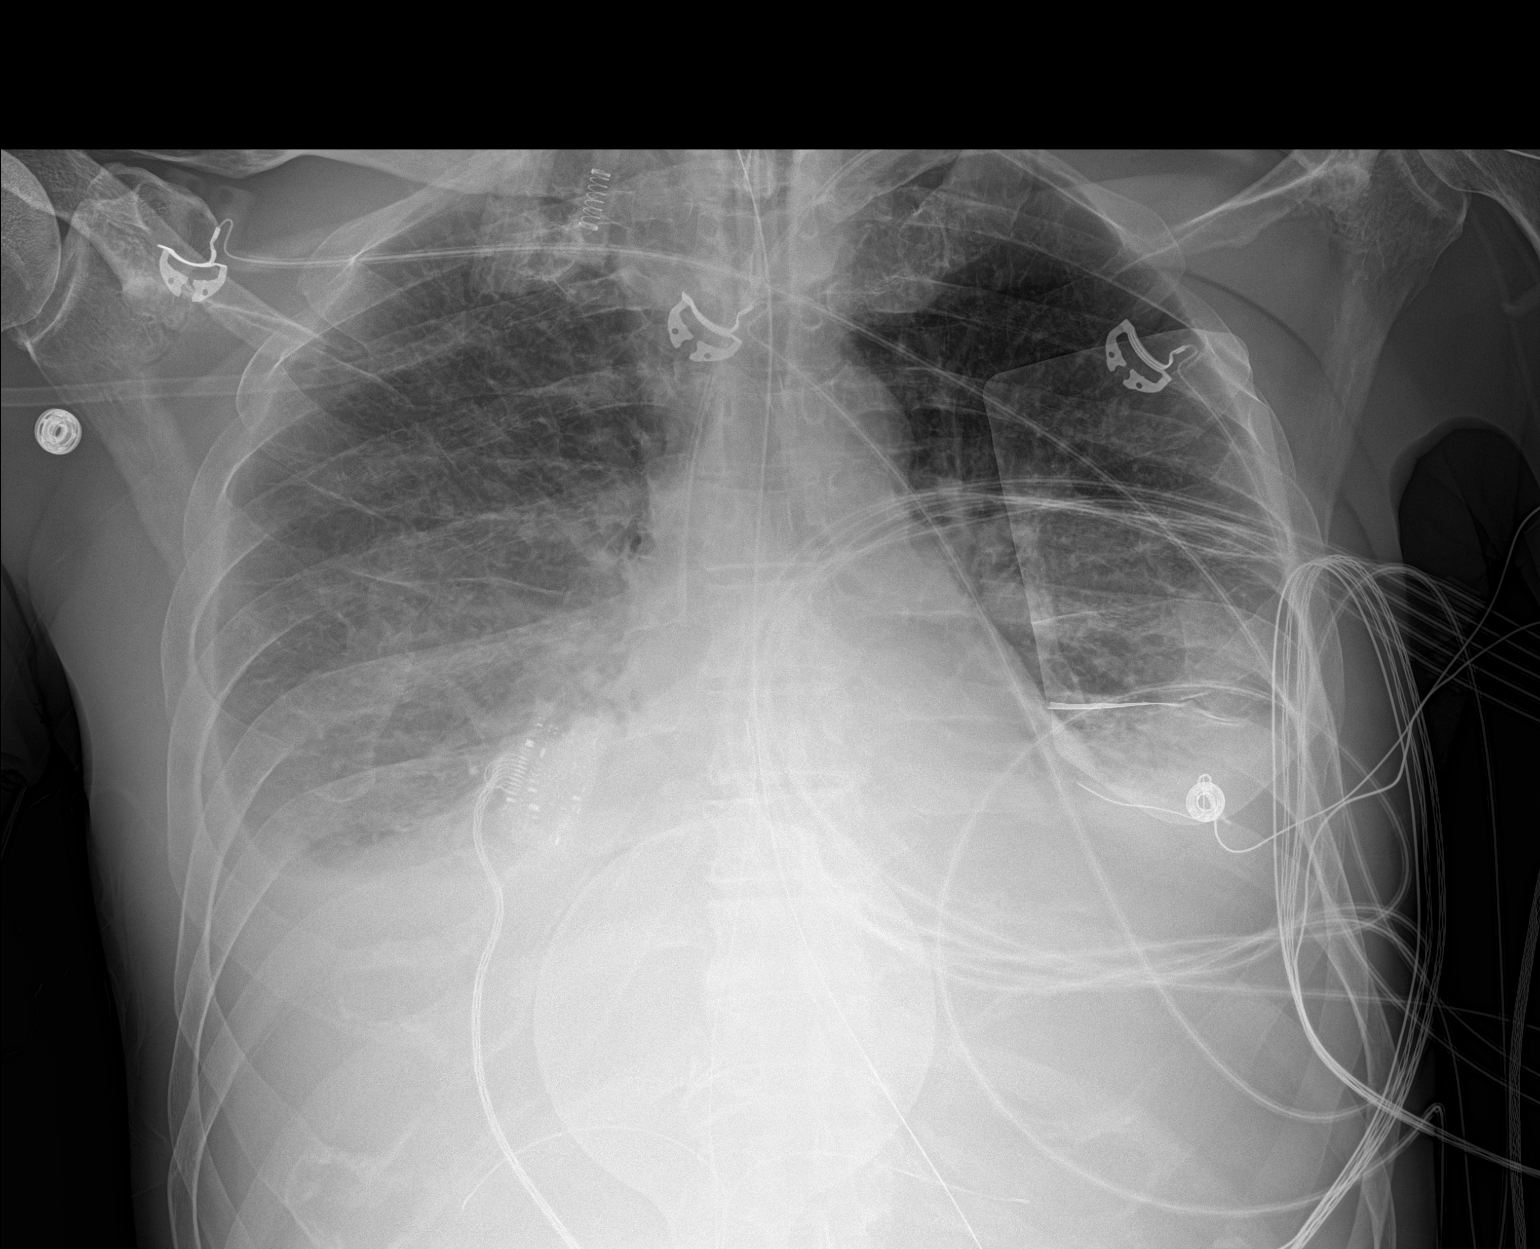

[1 of 1 positions shown; findings below may reference images not displayed]

FINDINGS: Cardiac shadow is stable. Endotracheal tube, gastric catheter and
left subclavian central line are again noted and stable. Bilateral
pleural effusions are noted stable in appearance when compare with
the prior exam. Mild vascular congestion remains.
IMPRESSION: Stable bilateral pleural effusions with vascular congestion.

## 2021-03-22 IMAGING — DX PORTABLE CHEST - 1 VIEW
1 series · 1 of 1 positions shown · non-contrast
Comparison: Chest x-ray from same day at [DATE] a.m.

CLINICAL DATA: Status post right thoracentesis.

EXAM:
PORTABLE CHEST 1 VIEW

[chest ap]
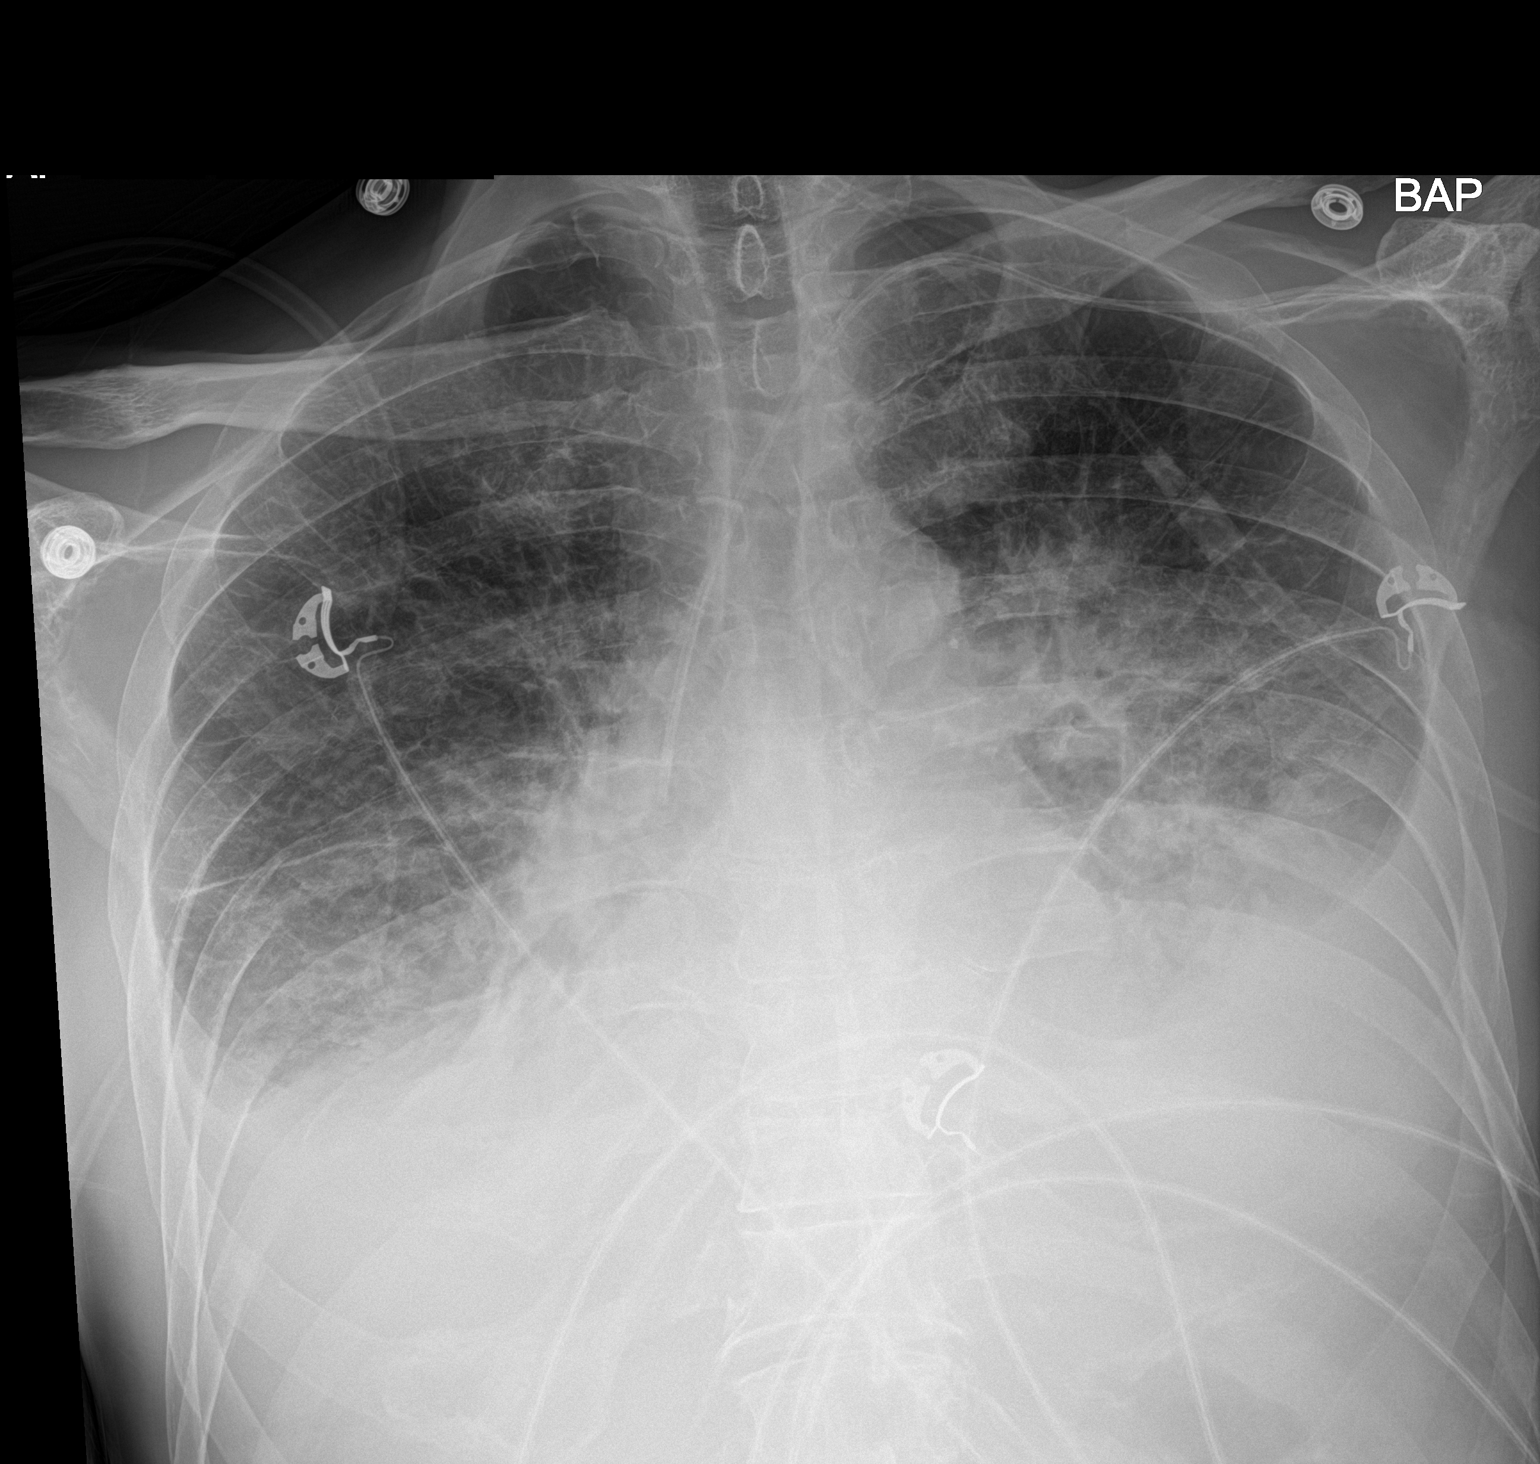

[1 of 1 positions shown; findings below may reference images not displayed]

FINDINGS: Unchanged left subclavian central venous catheter. Stable
cardiomediastinal silhouette. Unchanged pulmonary edema. Interval
decrease in size of the now small right pleural effusion. Unchanged
moderate left pleural effusion. Persistent bibasilar atelectasis,
slightly improved on the right. No pneumothorax.
IMPRESSION: 1. Decreased now small right pleural effusion status post
thoracentesis. No pneumothorax.
2. Unchanged pulmonary edema and moderate left pleural effusion.
3. Bibasilar atelectasis, mildly improved on the right.

## 2021-03-22 IMAGING — DX PORTABLE CHEST - 1 VIEW
1 series · 1 of 1 positions shown · non-contrast
Comparison: 04/25/2019

CLINICAL DATA: Respiratory failure.  Extubated.

EXAM:
PORTABLE CHEST 1 VIEW

[chest ap]
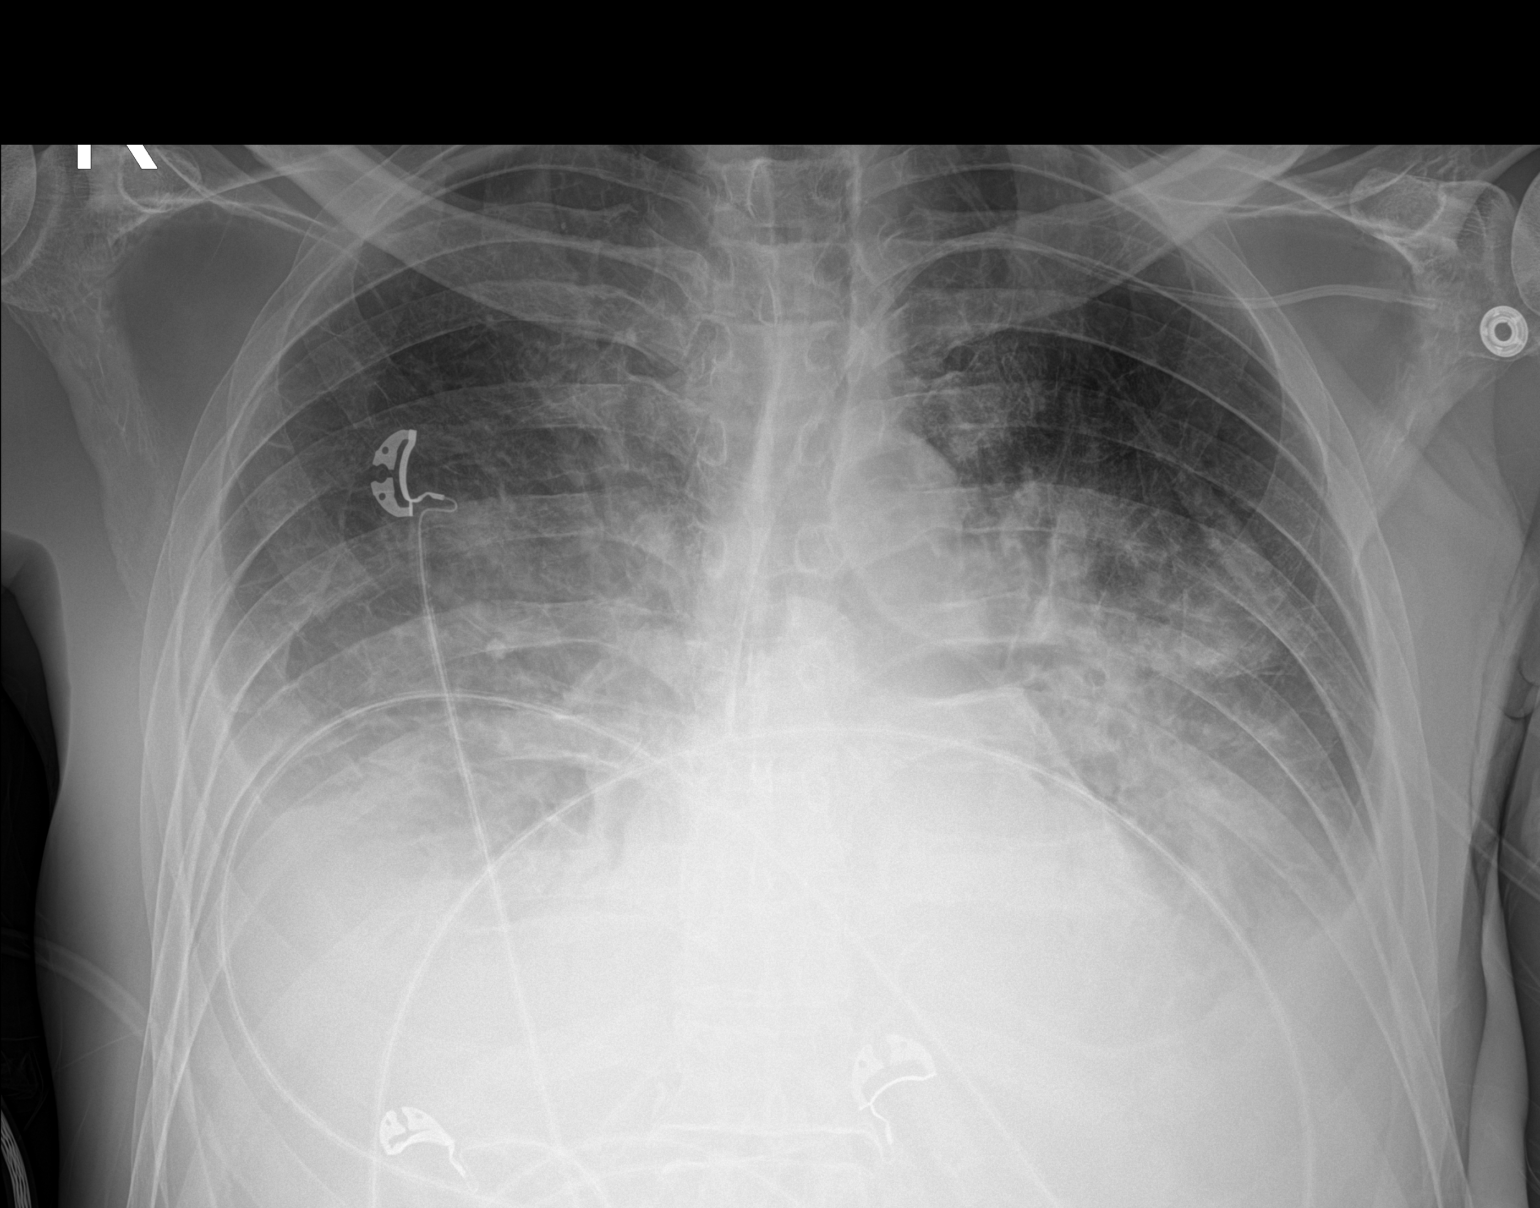

[1 of 1 positions shown; findings below may reference images not displayed]

FINDINGS: Endotracheal tube and orogastric tube have been removed. Left
subclavian central line remains in place with tip at the SVC RA
junction. Persistent bilateral effusions with bilateral lower lobe
atelectasis and or pneumonia. Similar appearance to yesterday other
than extubation.
IMPRESSION: Endotracheal tube and orogastric tube removed. Persistent bilateral
effusions with lower lobe atelectasis and or pneumonia.

## 2021-03-23 IMAGING — DX PORTABLE CHEST - 1 VIEW
1 series · 1 of 1 positions shown · non-contrast
Comparison: 04/27/2019

CLINICAL DATA: Status post thoracentesis

EXAM:
PORTABLE CHEST 1 VIEW

[chest ap]
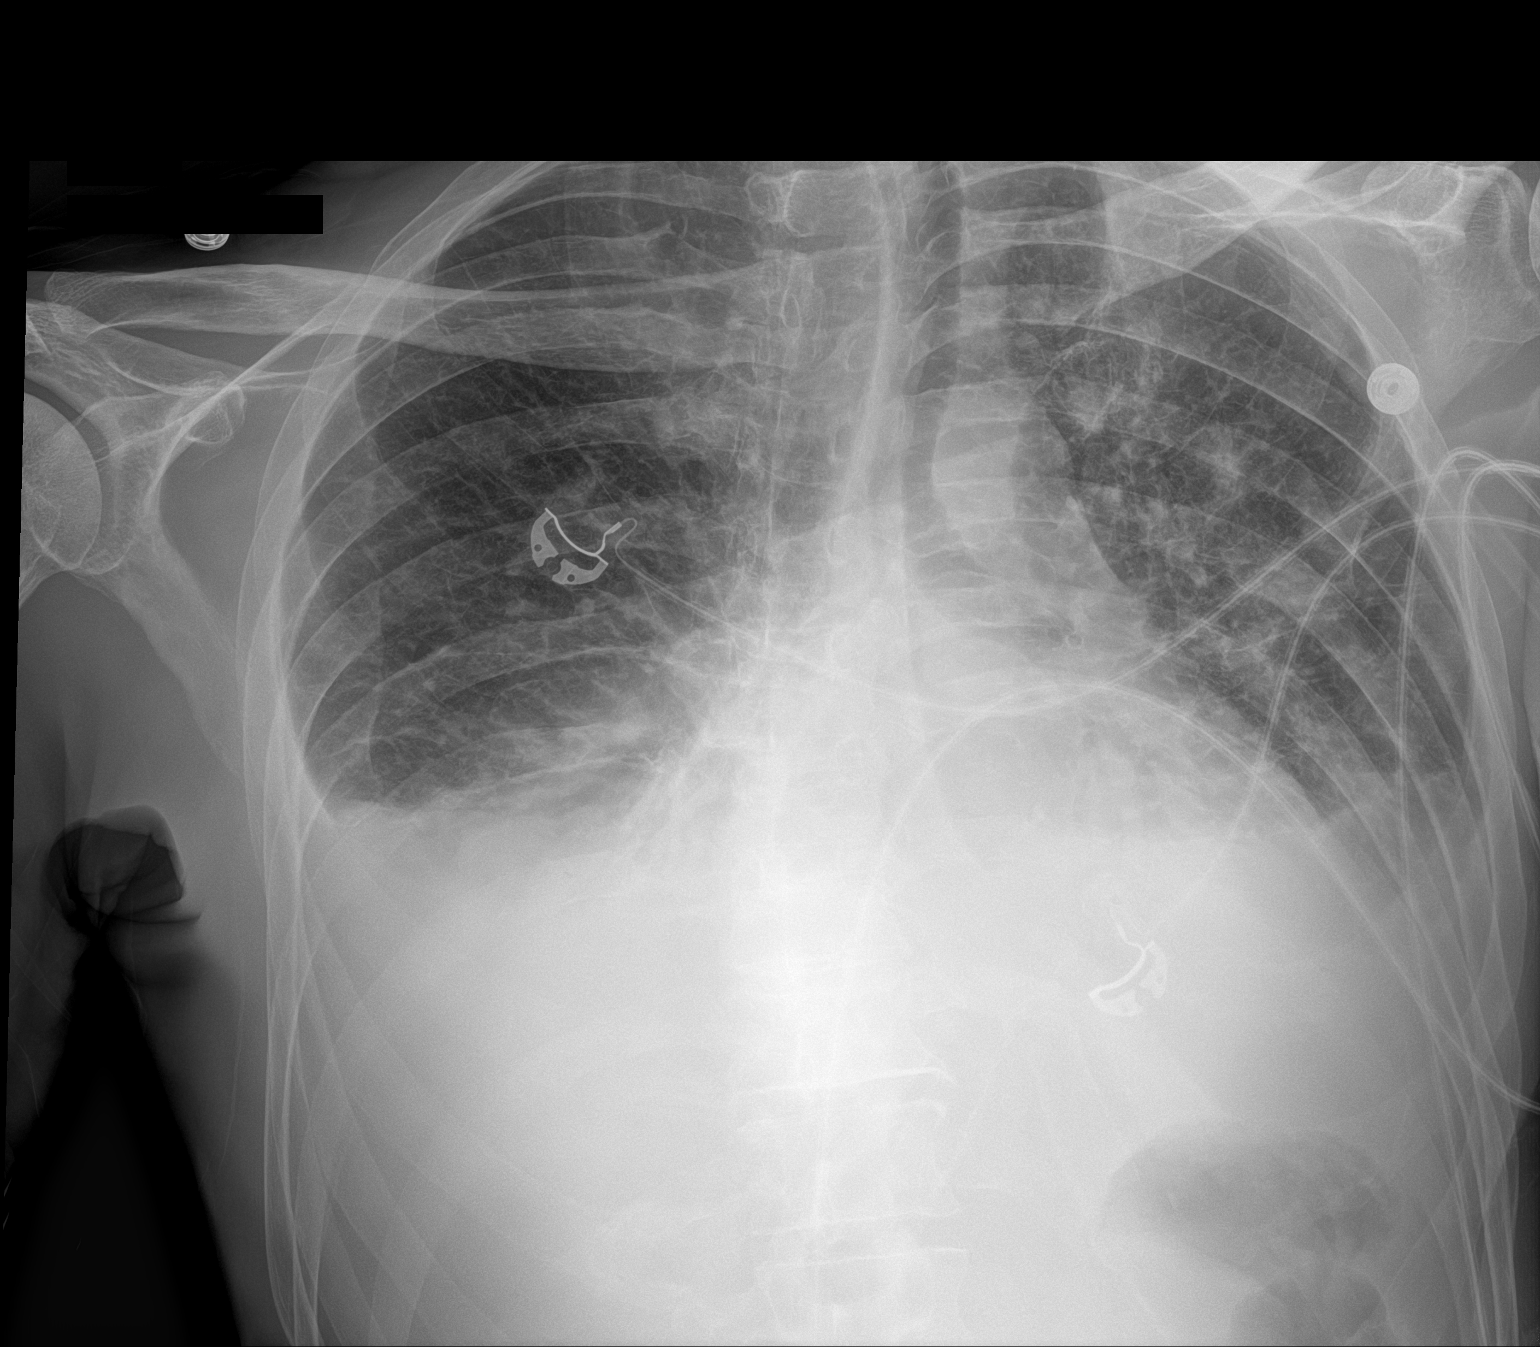

[1 of 1 positions shown; findings below may reference images not displayed]

FINDINGS: Unchanged size of small pleural effusions with associated basilar
atelectasis. No pneumothorax. Mild pulmonary edema, unchanged.
Normal cardiomediastinal contours.
IMPRESSION: Unchanged small pleural effusions.  No pneumothorax.

## 2021-03-23 IMAGING — DX PORTABLE CHEST - 1 VIEW
1 series · 1 of 1 positions shown · non-contrast
Comparison: 04/26/2019

CLINICAL DATA: Respiratory failure

EXAM:
PORTABLE CHEST 1 VIEW

[chest ap]
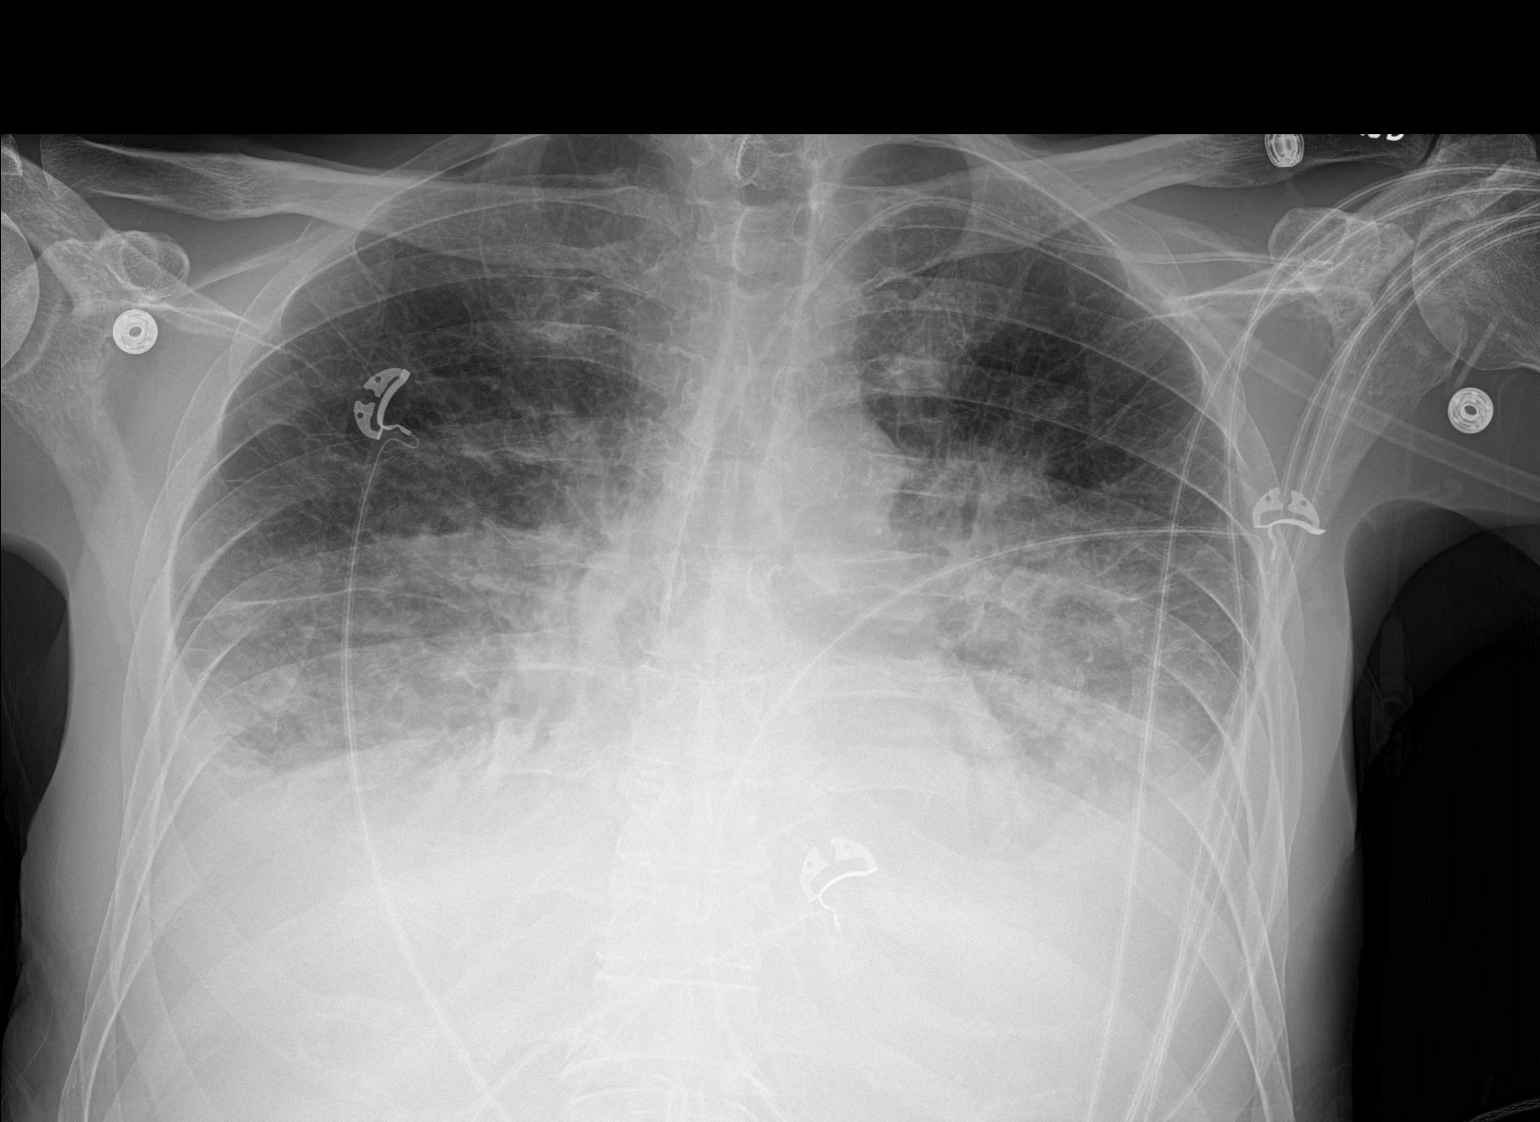

[1 of 1 positions shown; findings below may reference images not displayed]

FINDINGS: Cardiac shadow is stable. Left subclavian central venous line is
again noted and stable. Bilateral pleural effusions and bibasilar
infiltrates are again noted and stable. No pneumothorax is seen.
Mild pulmonary edema is noted as well.
IMPRESSION: Stable pulmonary edema with small bilateral pleural effusions. No
pneumothorax is noted.

## 2021-03-25 IMAGING — DX PORTABLE CHEST - 1 VIEW
1 series · 1 of 1 positions shown · non-contrast
Comparison: April 27, 2019

CLINICAL DATA: Shortness of breath.

EXAM:
PORTABLE CHEST 1 VIEW

[chest ap]
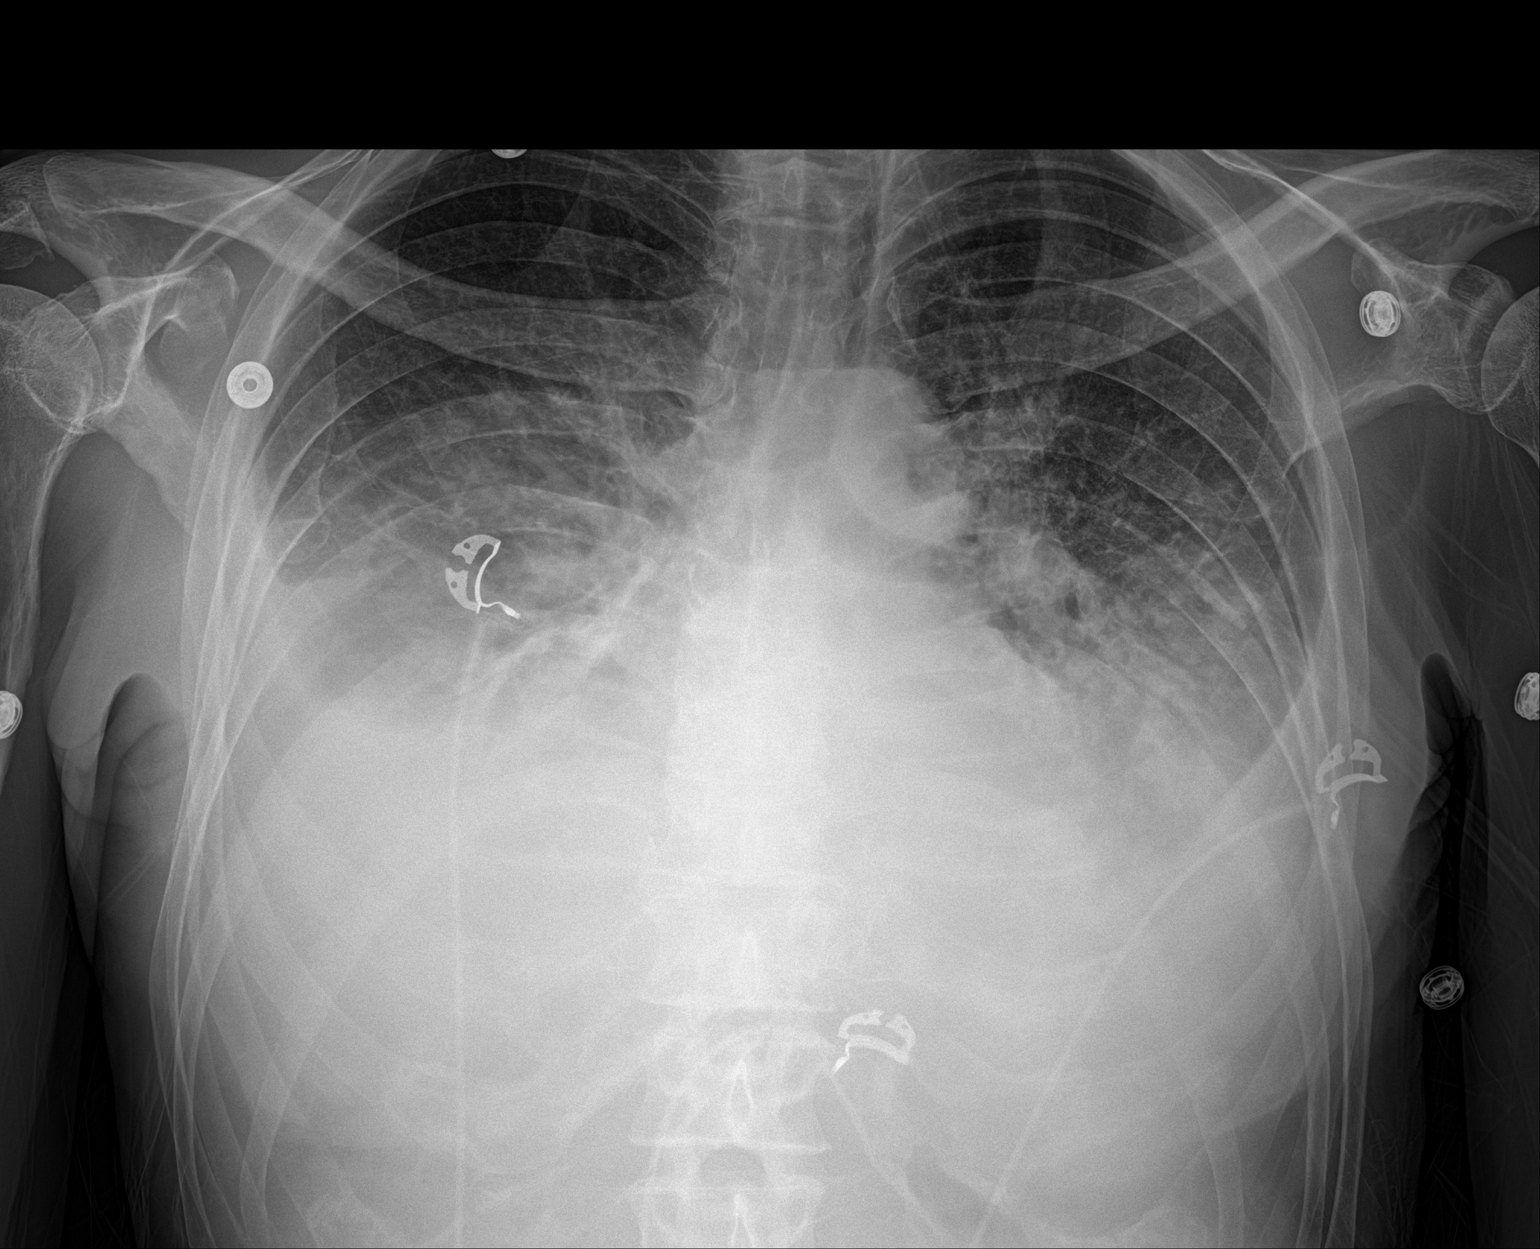

[1 of 1 positions shown; findings below may reference images not displayed]

FINDINGS: Bilateral pleural effusions with underlying opacities are more
prominent the interval. No other interval changes.
IMPRESSION: Increasing bilateral pleural effusions with underlying opacities.

## 2021-03-27 IMAGING — DX CHEST  1 VIEW
1 series · 1 of 1 positions shown · non-contrast
Comparison: 05/01/2019

CLINICAL DATA: Pleural effusion

EXAM:
CHEST  1 VIEW

[chest ap]
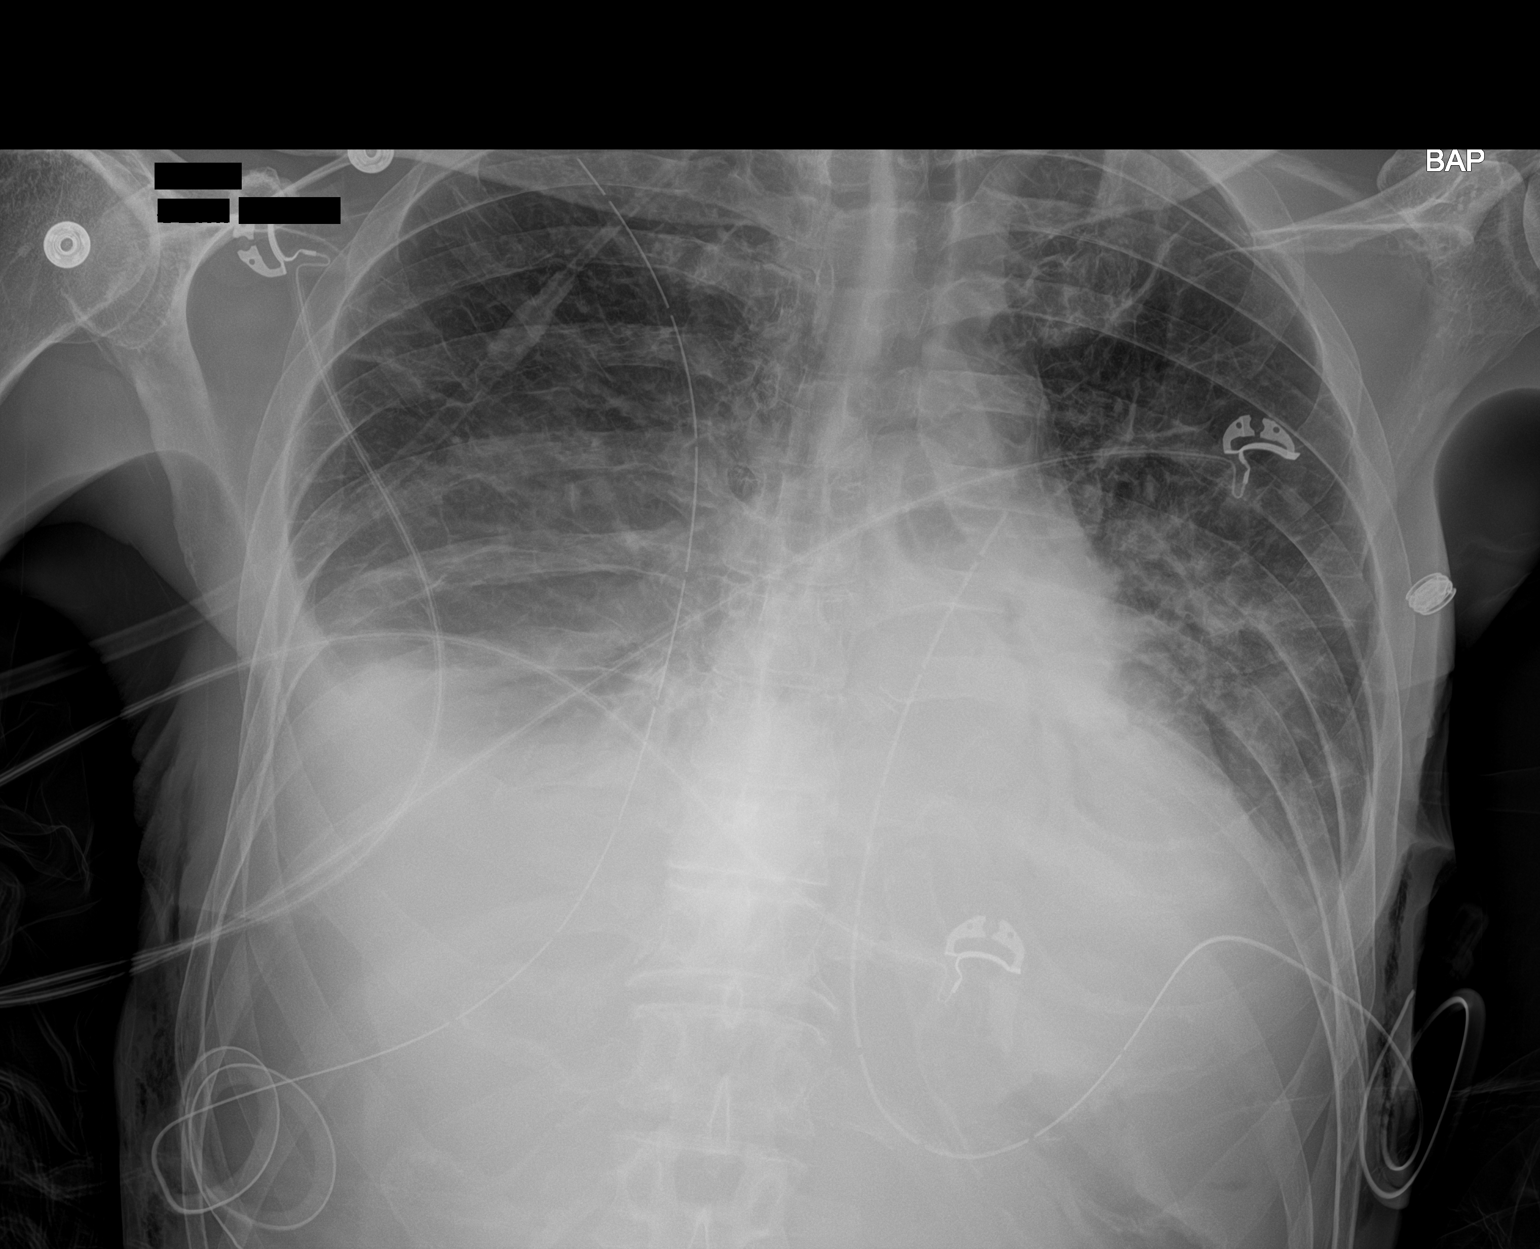

[1 of 1 positions shown; findings below may reference images not displayed]

FINDINGS: The patient has undergone placement of bilateral PleurX drainage
catheters. There is subcutaneous gas along the flanks. There is a
small left apical pneumothorax. No definite evidence of a
right-sided pneumothorax. There are persistent small to
moderate-sized bilateral pleural effusions. The heart size is stable
but enlarged. There is a dense retrocardiac opacity which is favored
to represent atelectasis. There is generalized volume overload
bilaterally.
IMPRESSION: 1. Status post placement of bilateral PleurX drainage catheters.
There is a small left-sided pneumothorax. No definite right-sided
pneumothorax.
2. Persistent small to moderate-sized bilateral pleural effusions.
3. Retrocardiac opacity favored to represent compressive
atelectasis.

## 2021-03-27 IMAGING — CR CHEST - 2 VIEW
2 series · 2 of 2 positions shown · non-contrast
Comparison: One-view chest x-ray 04/29/2019. CT of the chest
04/23/2019.

CLINICAL DATA: Preop for pleural drain insertion.

EXAM:
CHEST - 2 VIEW

[chest lat]
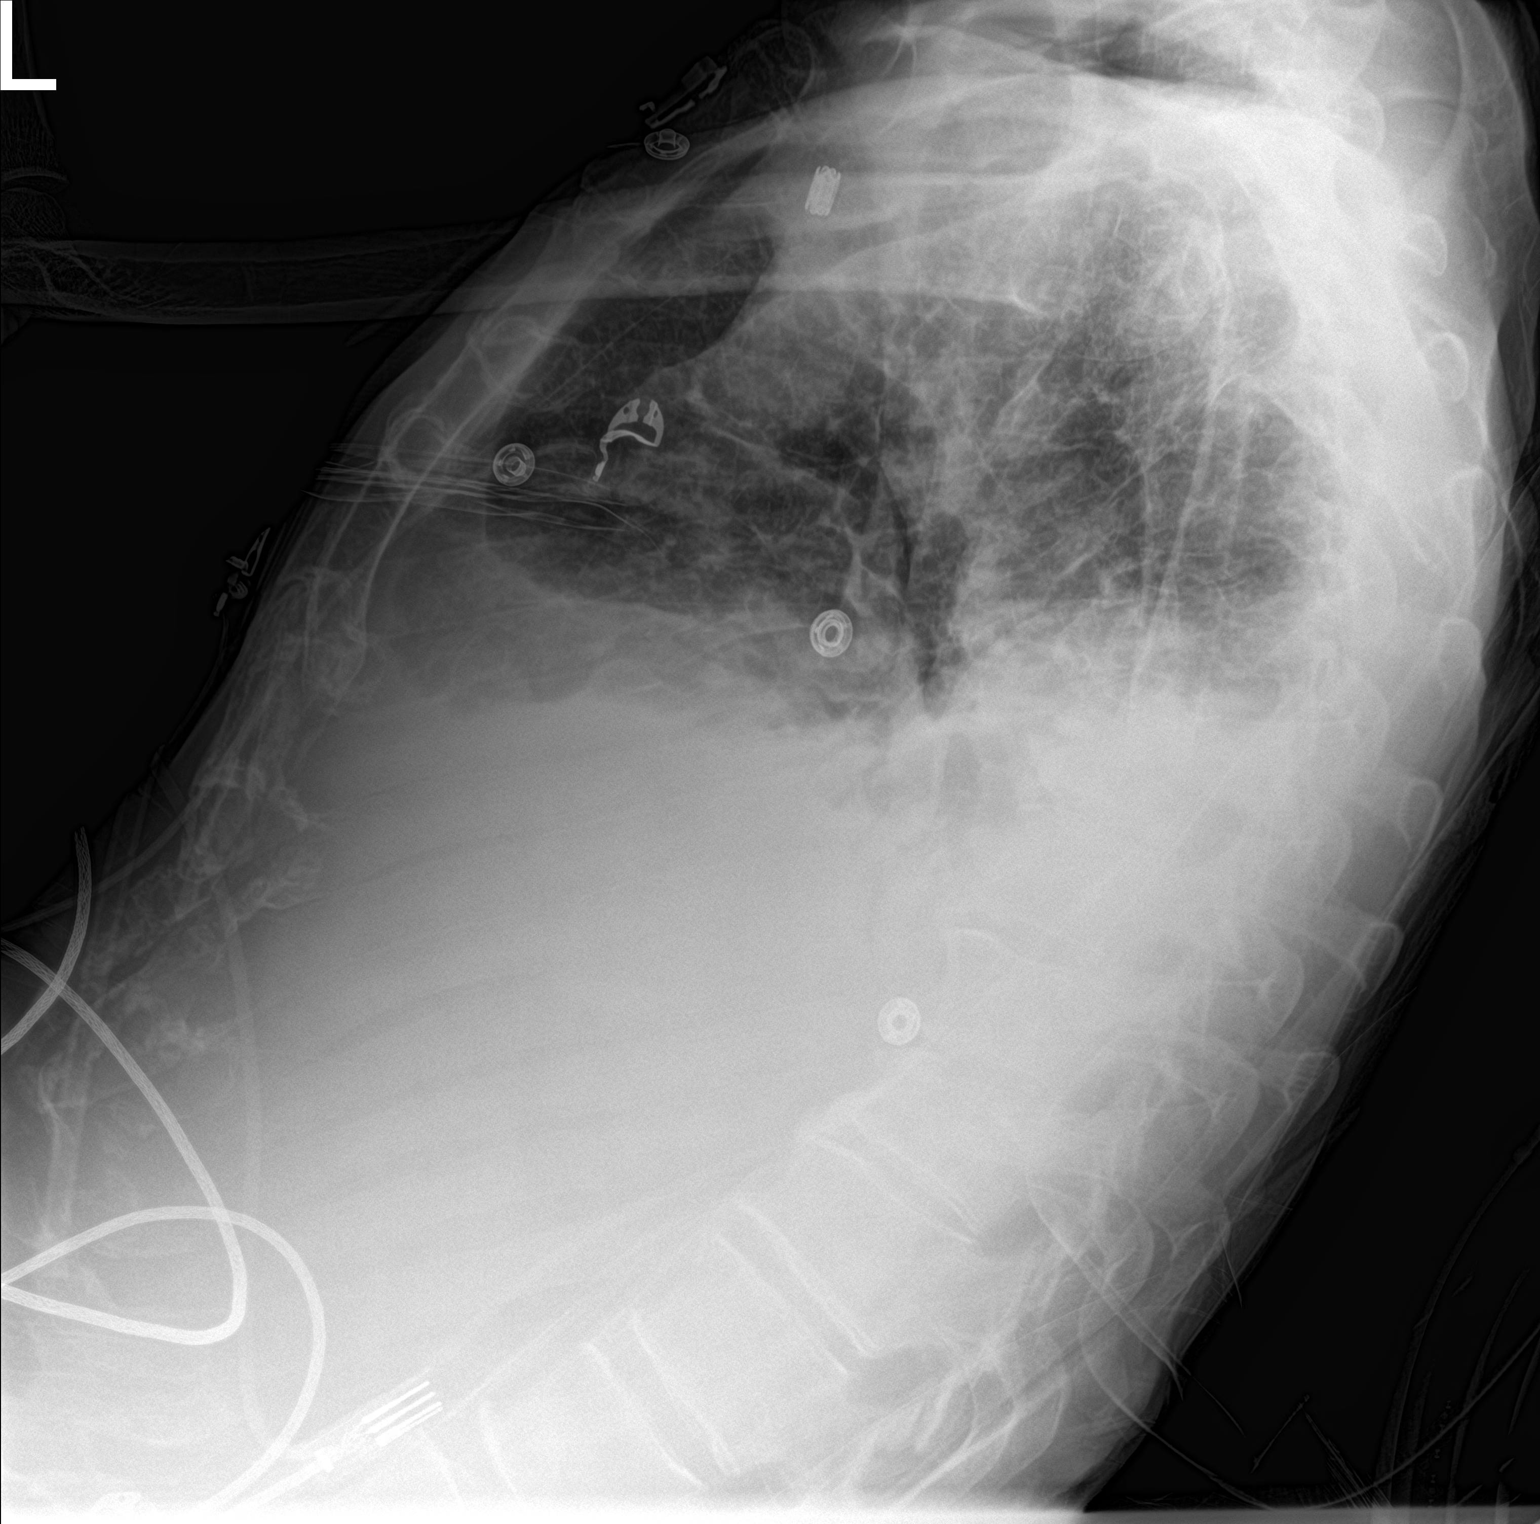

[chest ap]
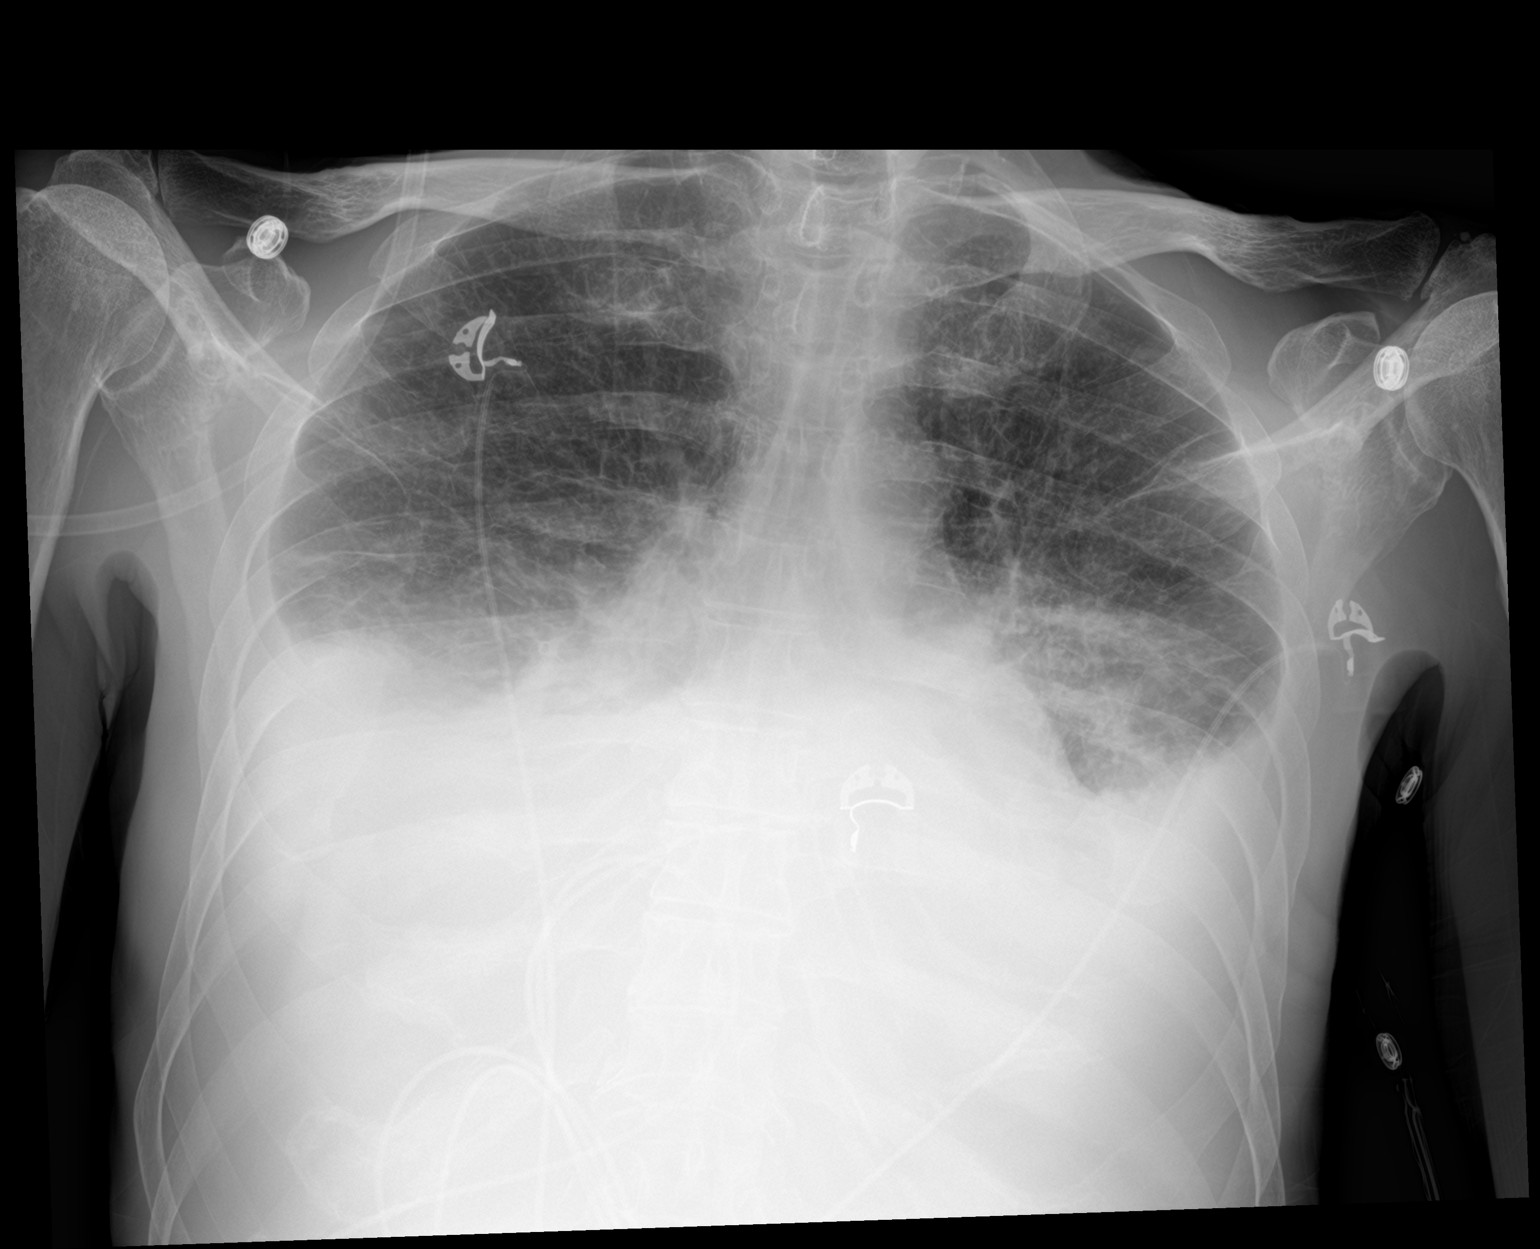

[2 of 2 positions shown; findings below may reference images not displayed]

FINDINGS: Heart size is upper limits of normal. Bilateral pleural effusions
and basilar airspace disease is similar the prior study. There is
moderate diffuse edema. Visualized soft tissues and bony thorax are
unremarkable.
IMPRESSION: 1. Similar appearance of bilateral pleural effusions and basilar
airspace disease. This likely reflects atelectasis, infection is not
excluded.
2. Borderline cardiomegaly with stable moderate edema.

## 2021-03-29 IMAGING — DX PORTABLE CHEST - 1 VIEW
1 series · 1 of 1 positions shown · non-contrast
Comparison: 04/21/2019 and prior radiographs

CLINICAL DATA: PleurX drainage catheter and LEFT pneumothorax
follow-up

EXAM:
PORTABLE CHEST 1 VIEW

[chest ap]
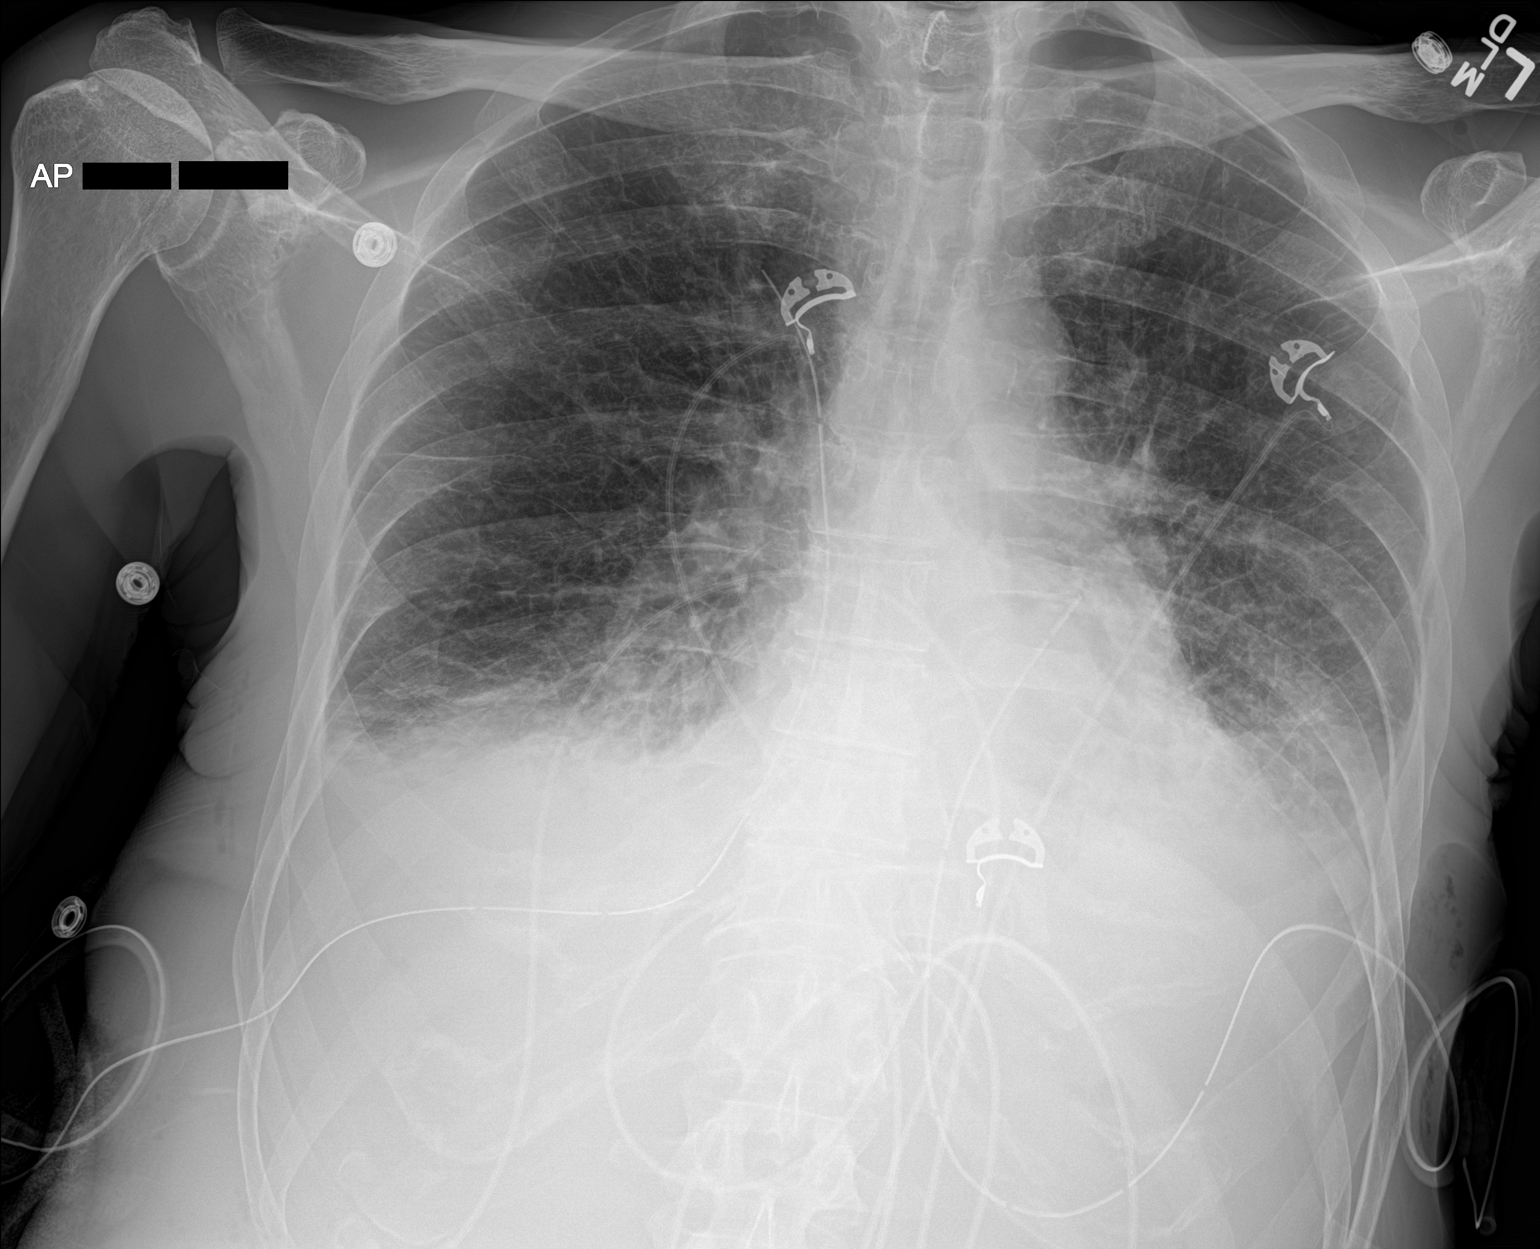

[1 of 1 positions shown; findings below may reference images not displayed]

FINDINGS: Cardiomediastinal silhouette is obscured but unchanged.

Bilateral thoracostomy tube/catheter is again noted.

A very small LEFT apical pneumothorax has not significantly changed.

Bilateral pleural effusions and bibasilar atelectasis again noted,
slightly improved on the RIGHT.

Interstitial edema appears slightly decreased.
IMPRESSION: 1. Bilateral pleural effusions and bibasilar atelectasis, increased
on the RIGHT.
2. Unchanged very small LEFT apical pneumothorax
3. Question interstitial edema which appears improved.

## 2021-04-10 IMAGING — CR CHEST - 2 VIEW
2 series · 2 of 2 positions shown · non-contrast
Comparison: May 03, 2019 and May 01, 2019

CLINICAL DATA: Chest drains.  Multiple myeloma

EXAM:
CHEST - 2 VIEW

[w chest lat]
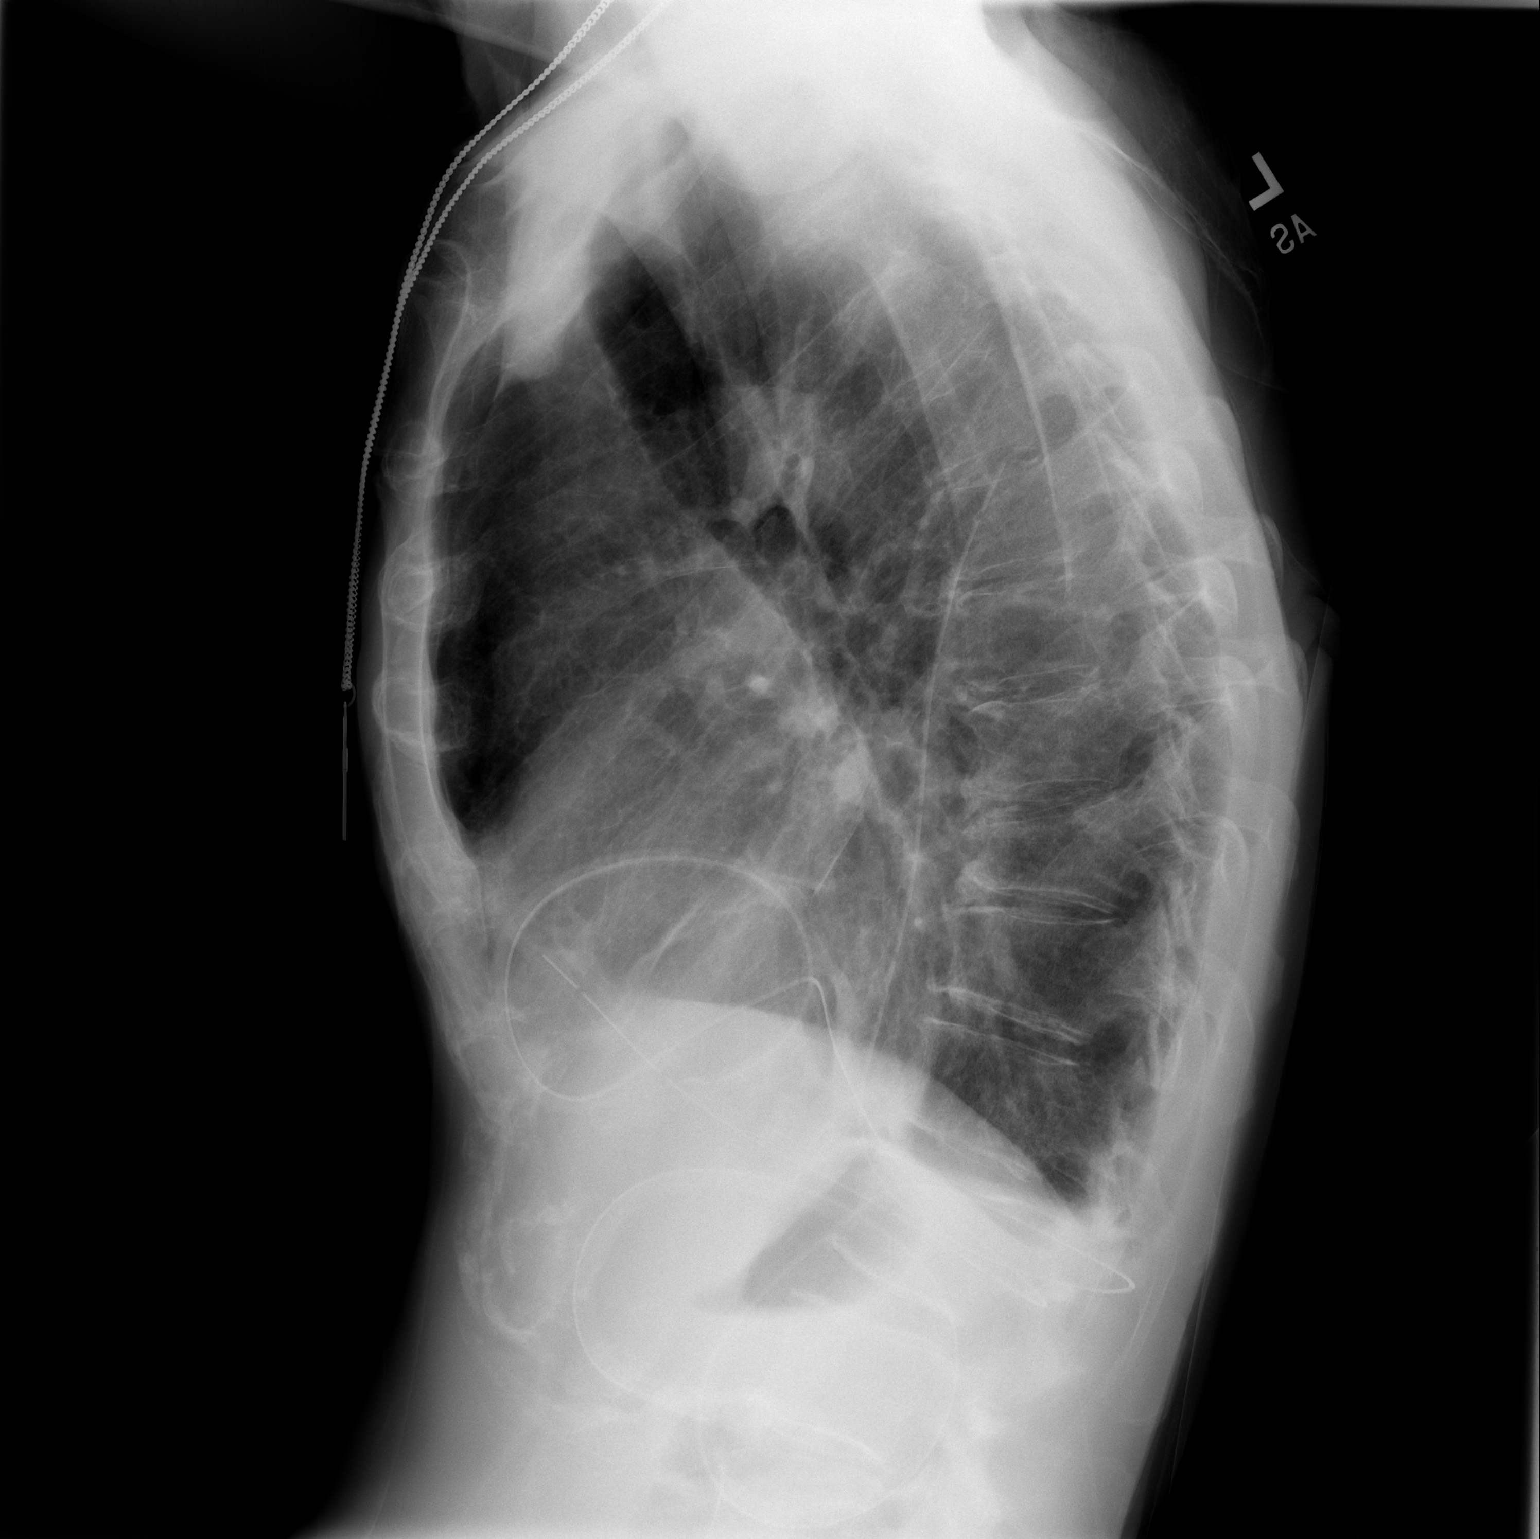

[w chest ap]
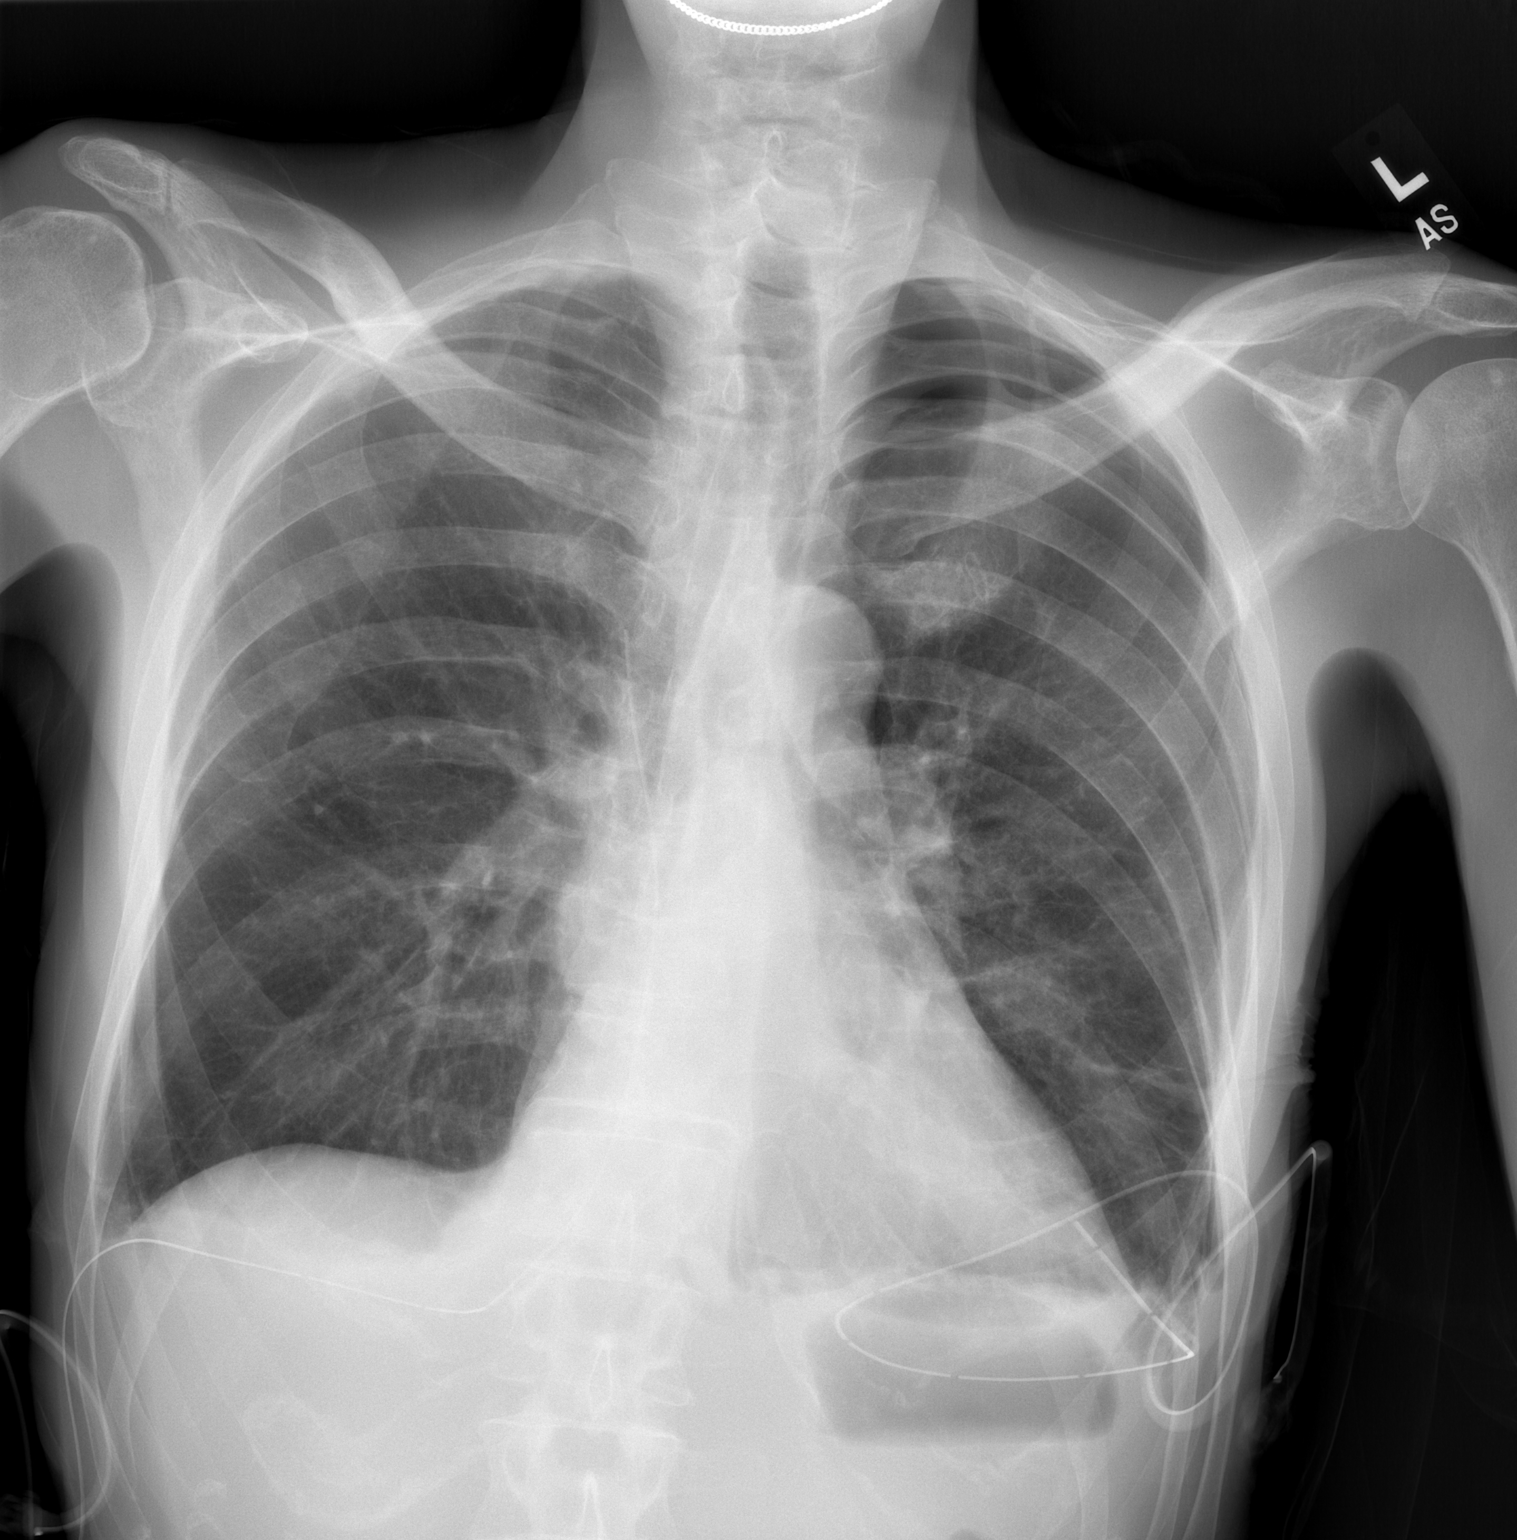

[2 of 2 positions shown; findings below may reference images not displayed]

FINDINGS: The chest drain on the right is unchanged in position. The chest
drain on the left has pulled back with the tip along the inferior
aspect of the left hemithorax. Pneumothorax on the left has become
somewhat larger, approximately 10-15% at this time, without tension
component.

There is a small left pleural effusion with left base atelectasis.
There is a minimal right pleural effusion. Heart size and pulmonary
vascularity are. No adenopathy. There is anterior wedging of a lower
thoracic vertebral body, stable.
IMPRESSION: Pneumothorax on the left somewhat larger with chest tube pulled back
on the left to the inferior left hemithorax. No tension component.
There are small pleural effusions bilaterally with left base
atelectasis. Stable cardiac silhouette.

Critical Value/emergent results were called by telephone at the time
of interpretation on 05/15/2019 at [DATE] to Dr. DREY JIM ,
who verbally acknowledged these results.

## 2021-05-08 IMAGING — CR CHEST - 2 VIEW
2 series · 2 of 2 positions shown · non-contrast
Comparison: May 15, 2019

CLINICAL DATA: Pleural effusion history of pleural drainage
catheter is 05/01/2019

EXAM:
CHEST - 2 VIEW

[w chest pa]
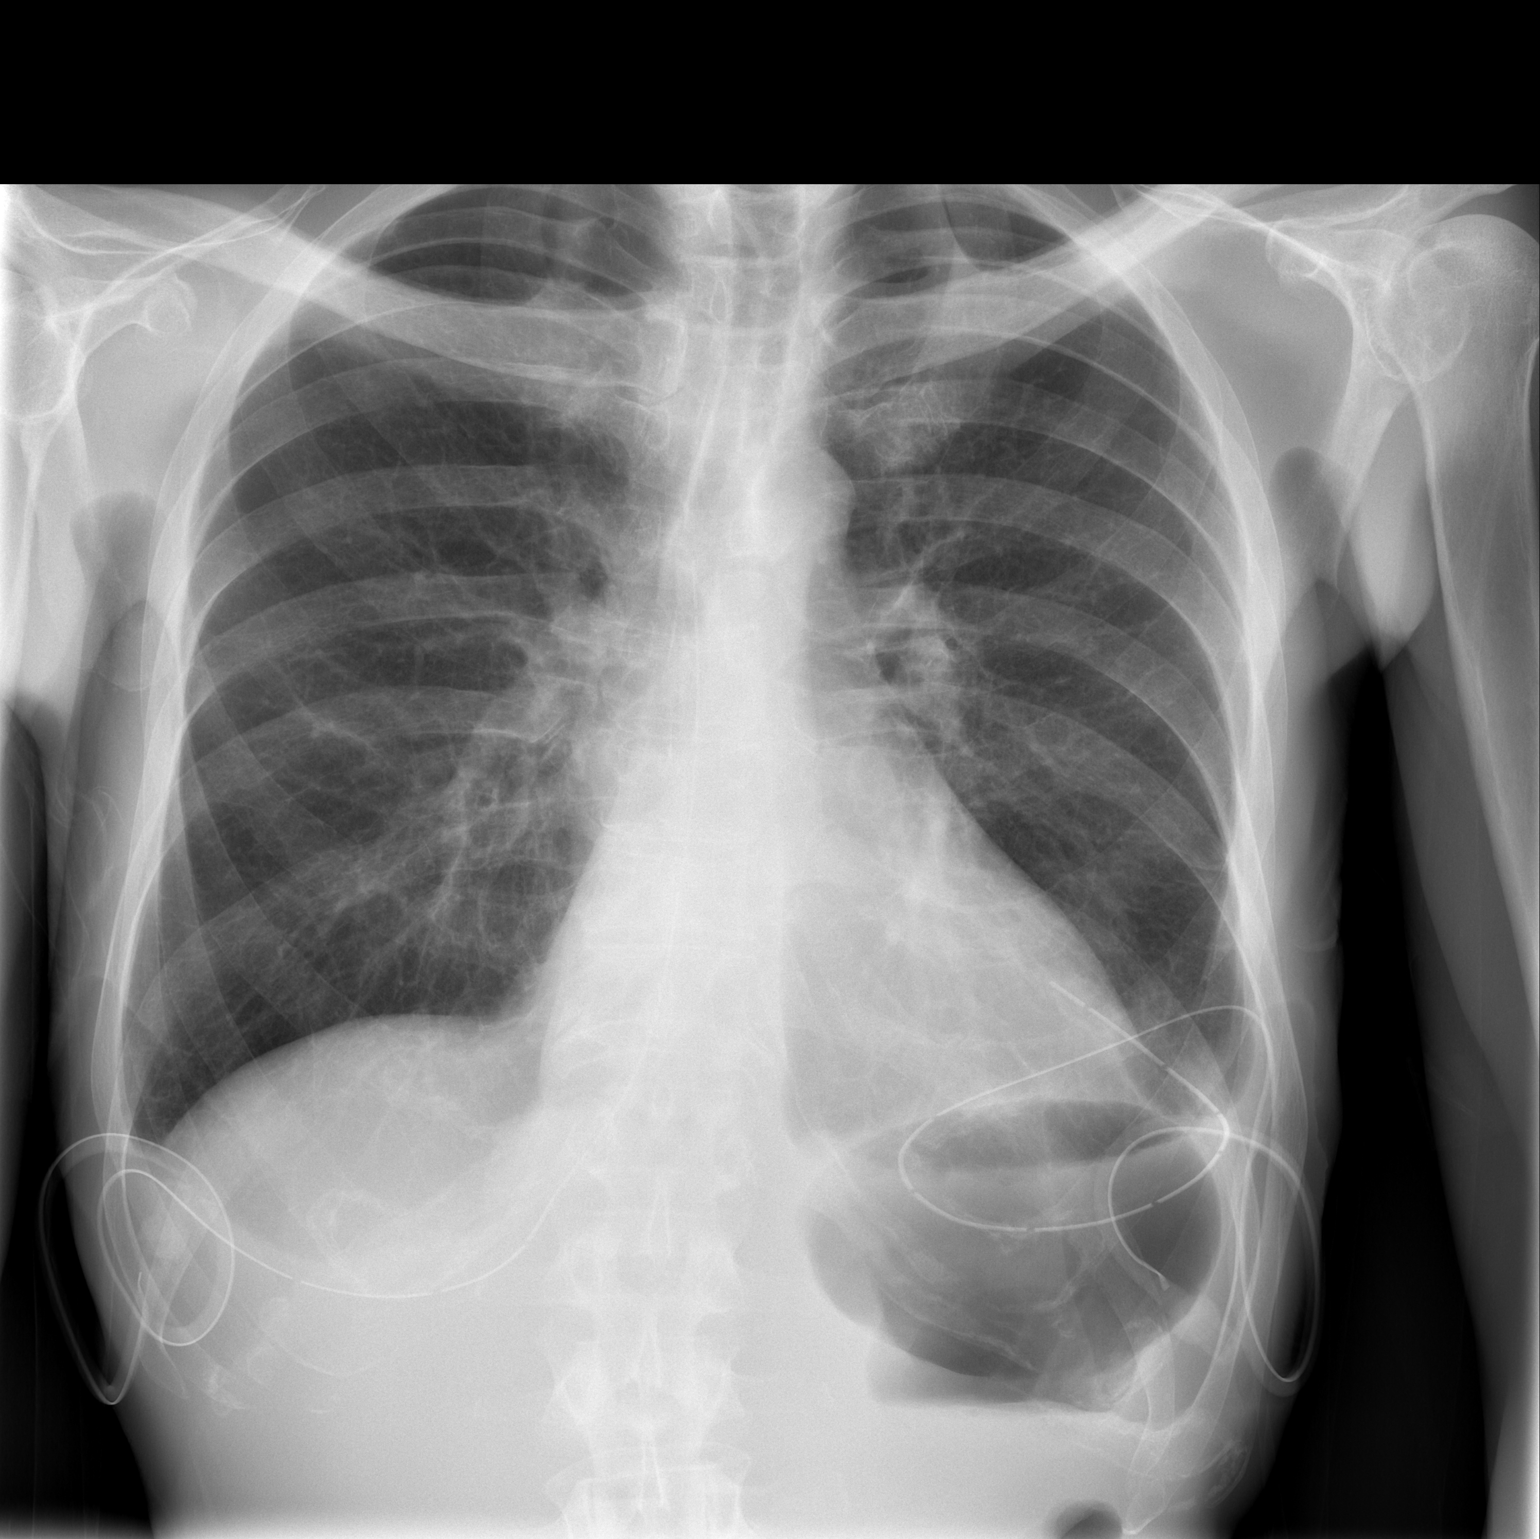

[w chest lat]
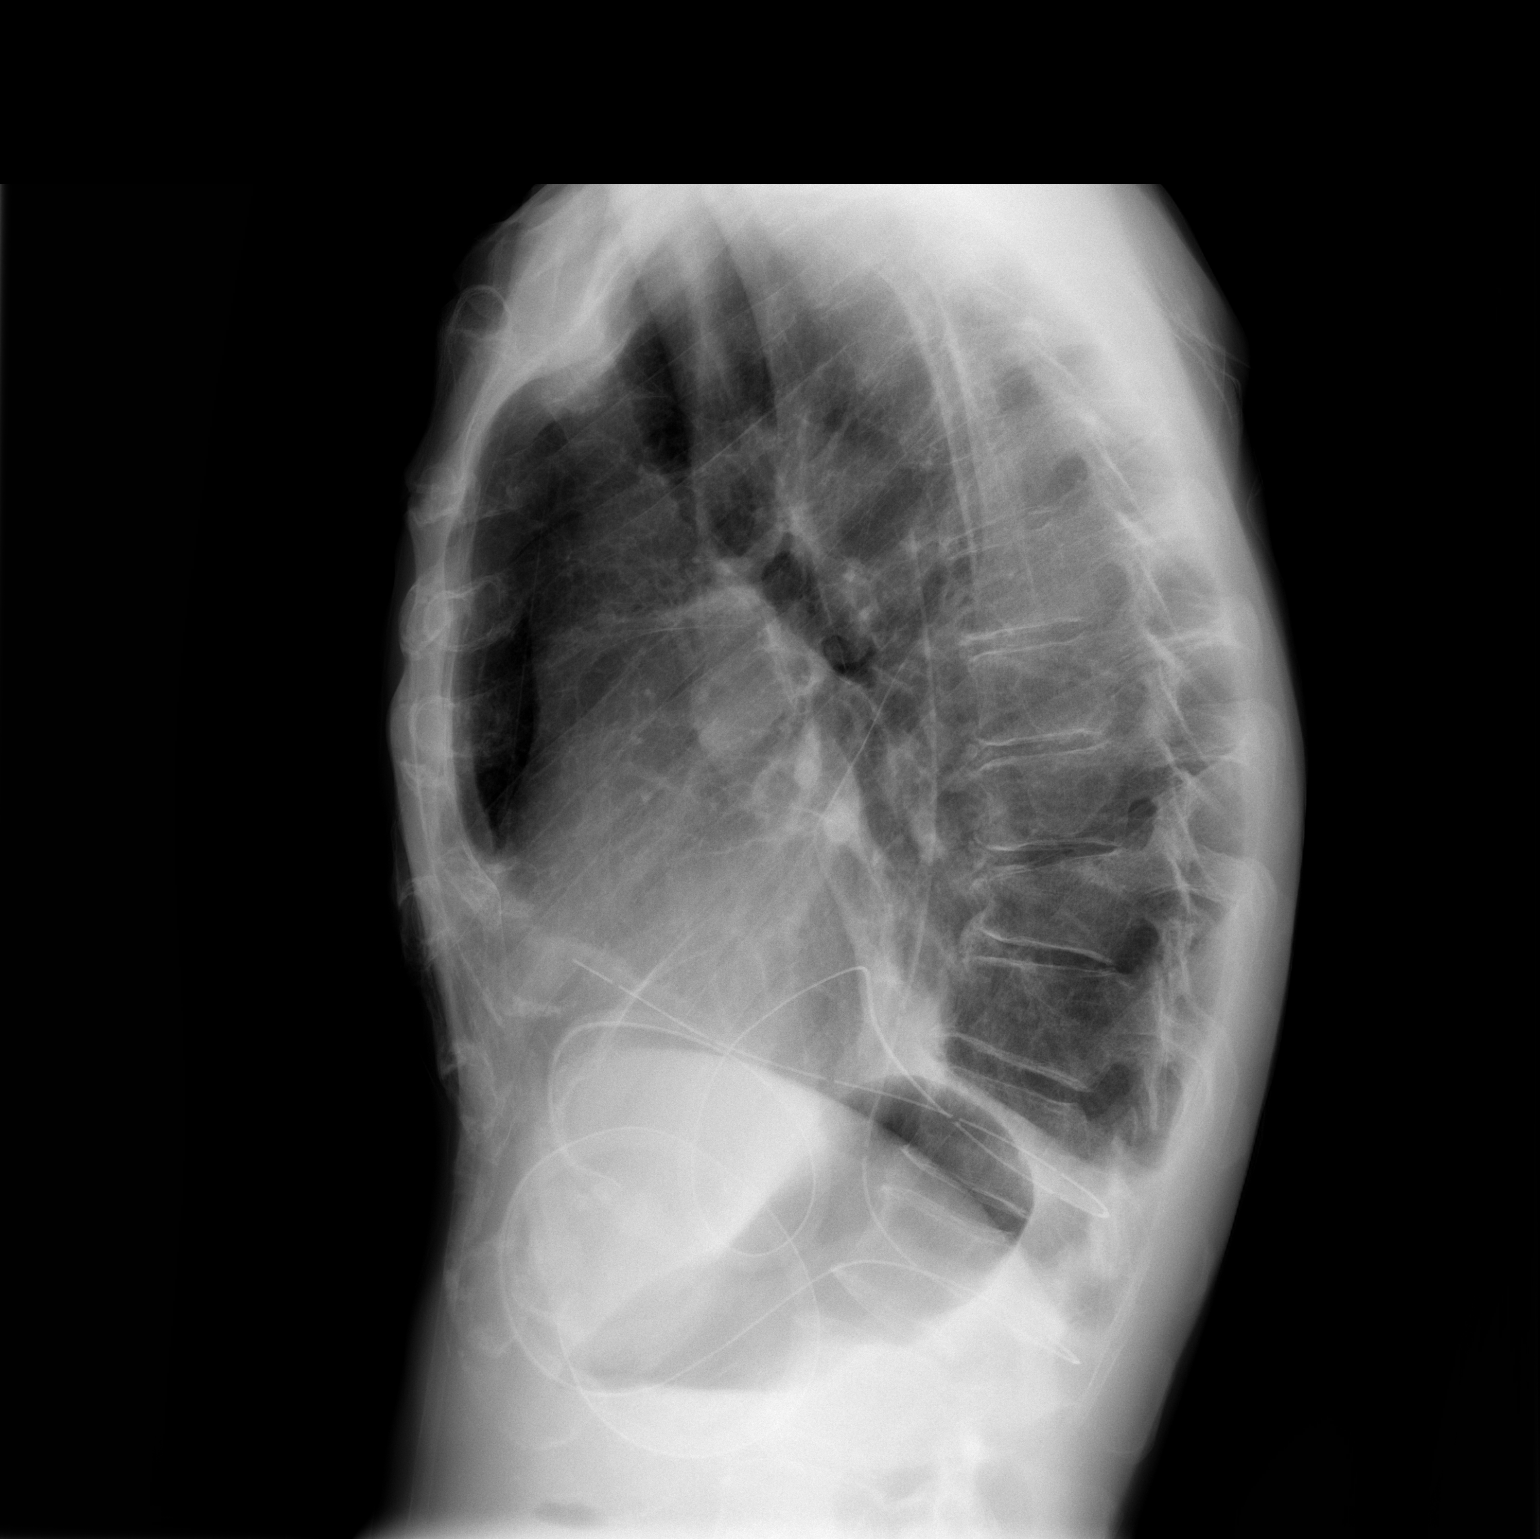

[2 of 2 positions shown; findings below may reference images not displayed]

FINDINGS: Again noted are 2 pleural drains. No pneumothorax is seen. There is
a trace left pleural effusion as on the prior exam. No right-sided
pleural effusion is seen. The lungs are otherwise clear. The
cardiomediastinal silhouette is unremarkable.
IMPRESSION: 1. Bilateral pleural drains in unchanged position
2. No pneumothorax
3. Small left pleural effusion, not significantly changed since
prior.

## 2021-06-14 IMAGING — CR DG CHEST 2V
2 series · 2 of 2 positions shown · non-contrast
Comparison: Chest x-ray 06/12/2019.

CLINICAL DATA: 58-year-old male with history of pleural effusion.

EXAM:
CHEST - 2 VIEW

[w chest pa]
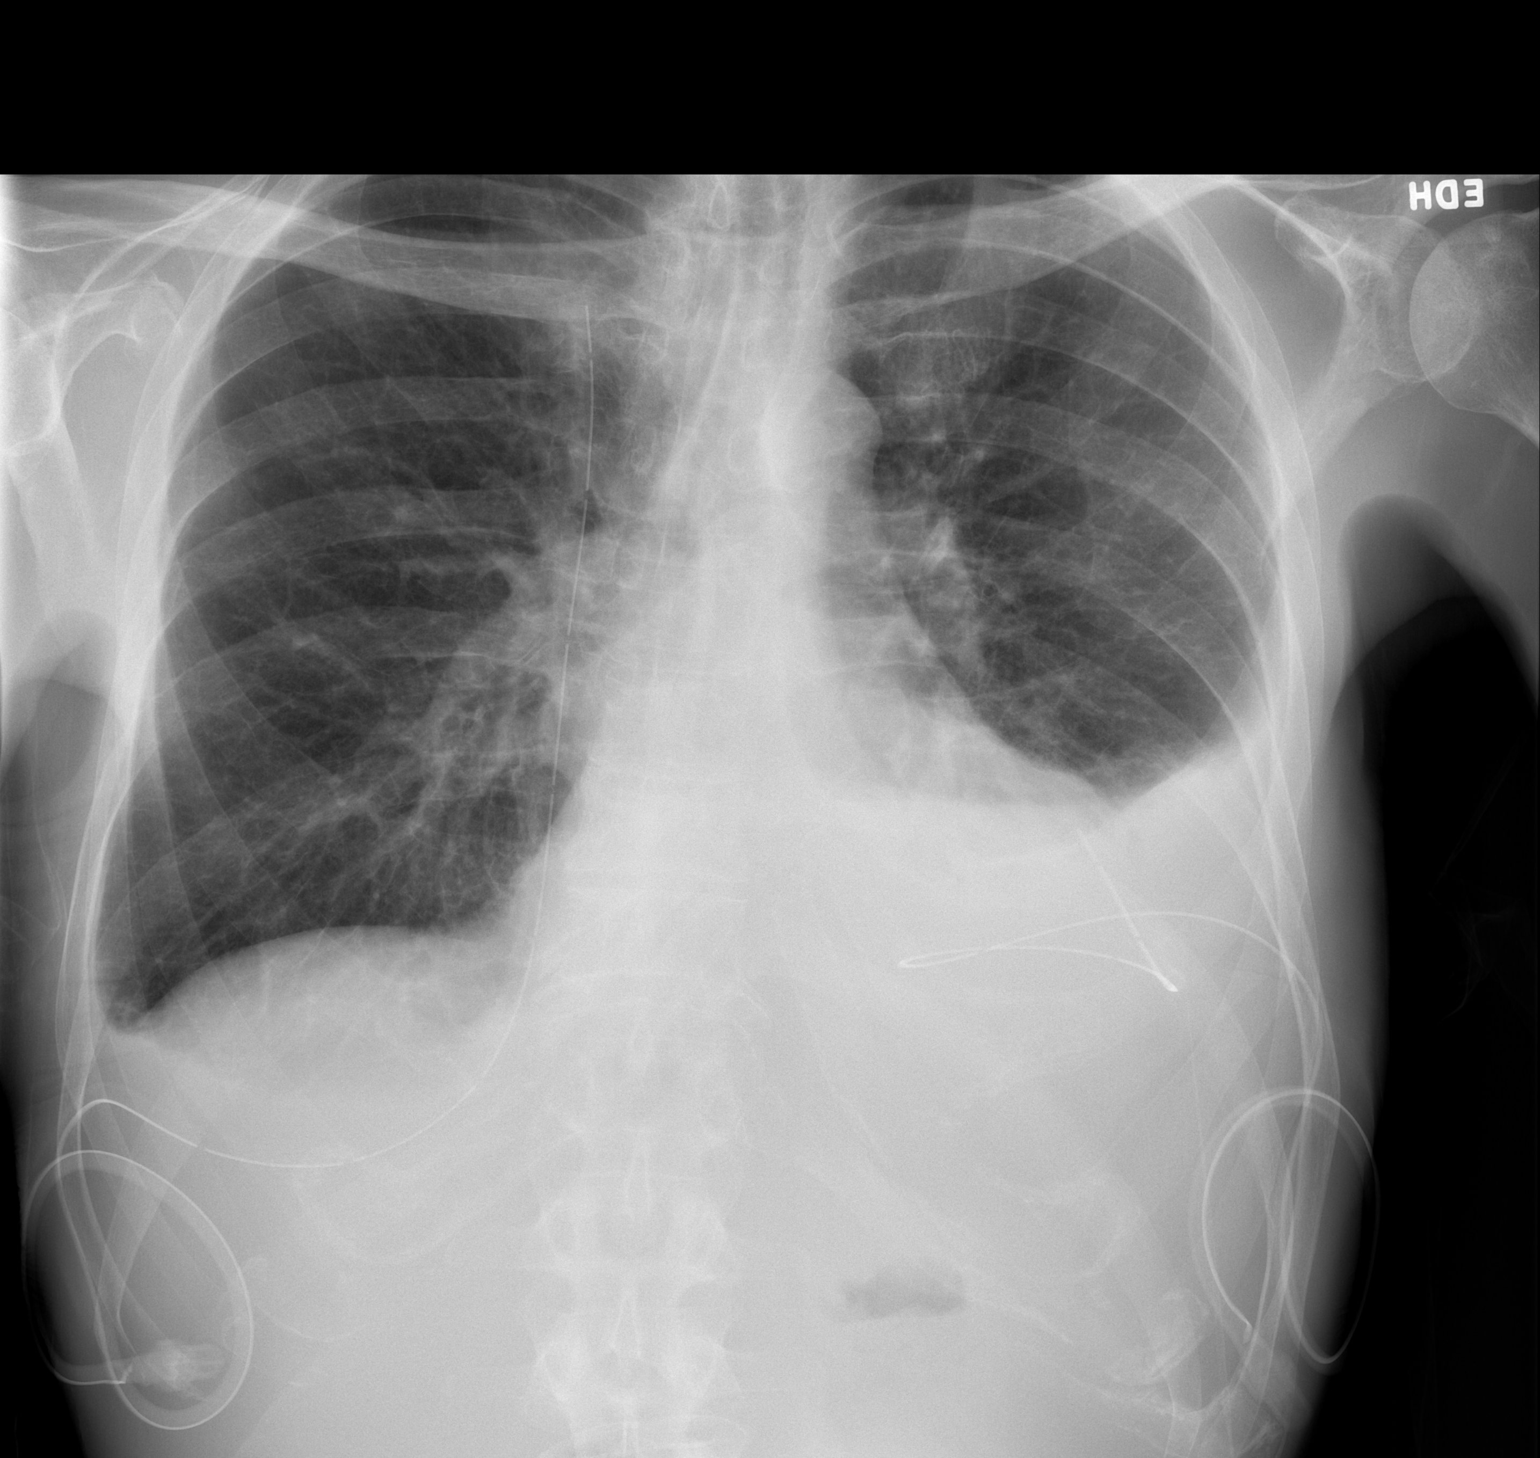

[w chest lat]
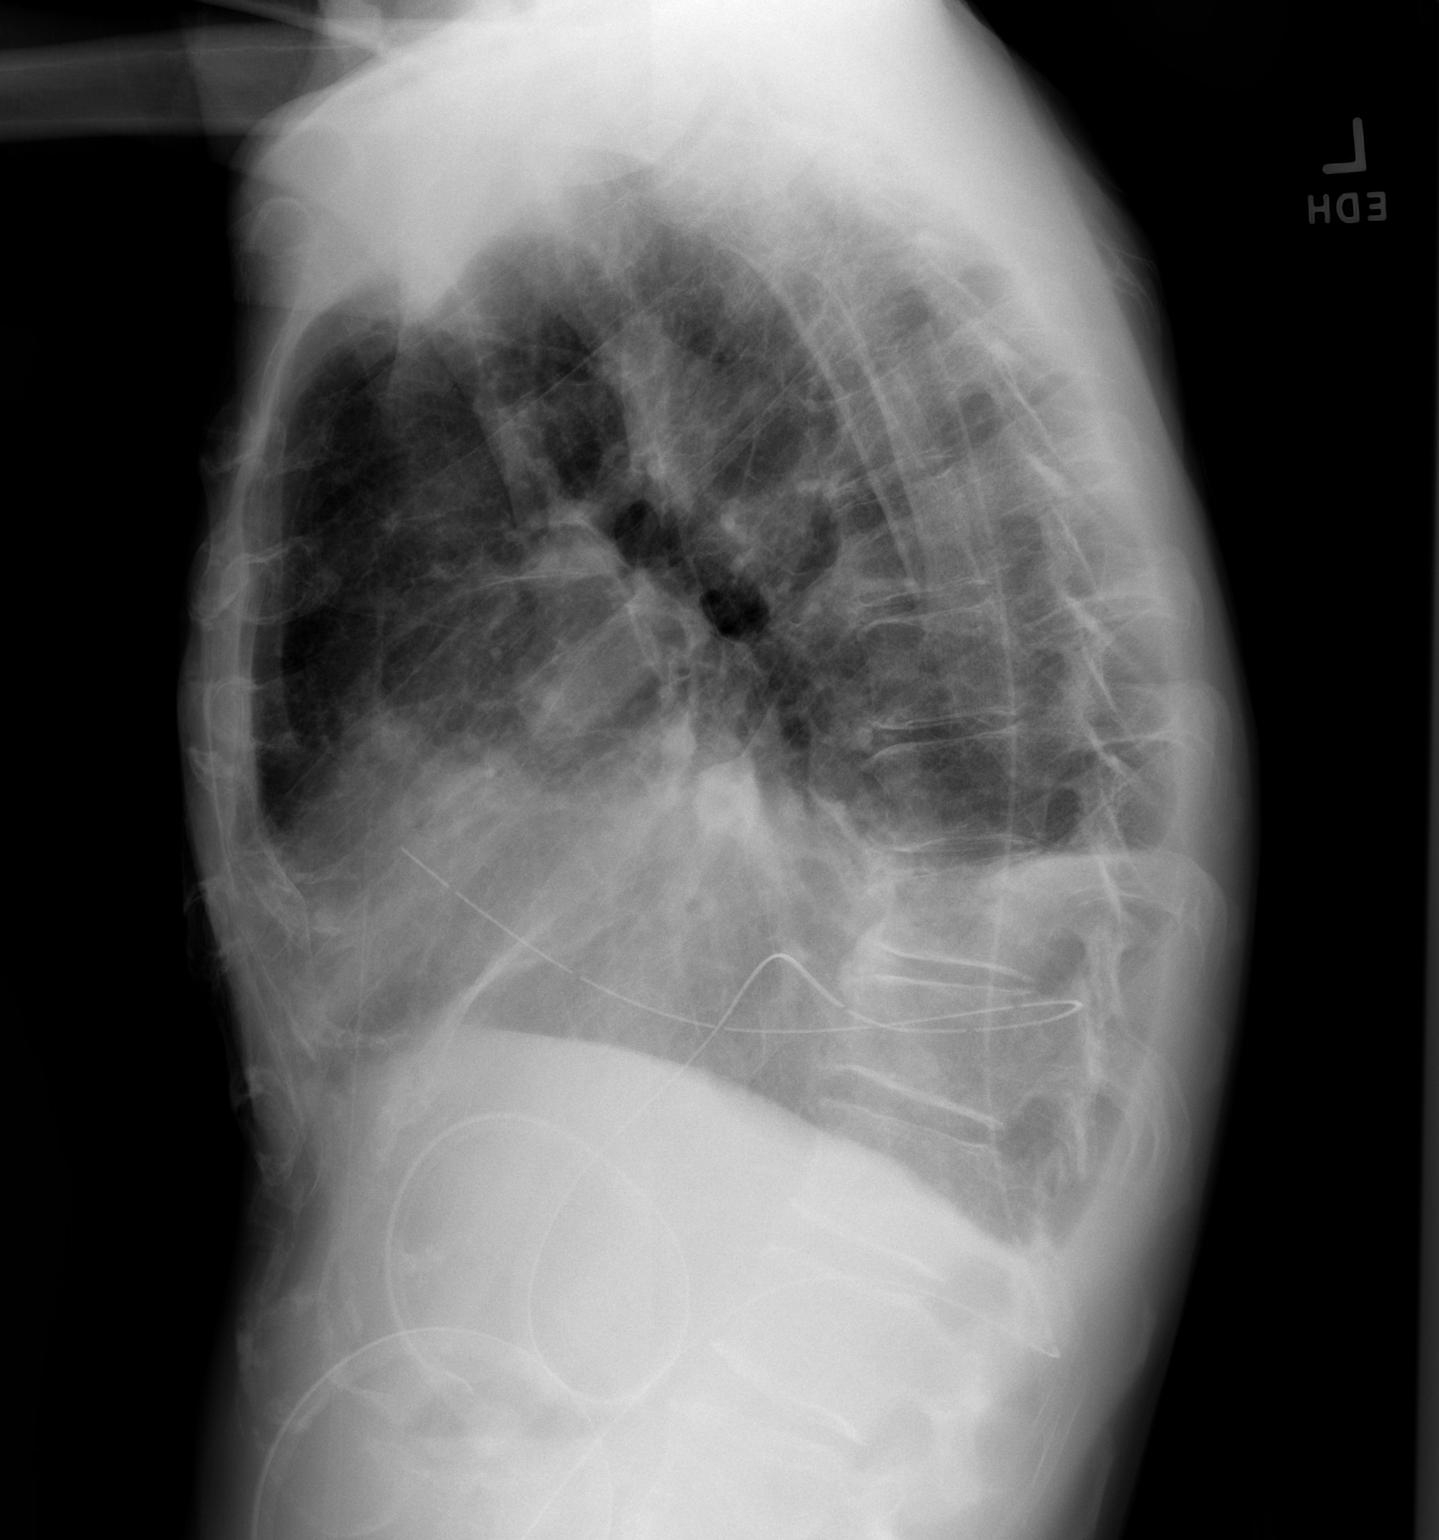

[2 of 2 positions shown; findings below may reference images not displayed]

FINDINGS: Bilateral chest tubes are stable in position. Moderate left and
small right pleural effusions have significantly increased compared
to the prior study. No appreciable pneumothorax. Bibasilar opacities
are favored to reflect areas of passive subsegmental atelectasis. No
evidence of pulmonary edema. Heart size is normal. Upper mediastinal
contours are within normal limits.
IMPRESSION: 1. Bilateral chest tubes are stable in position, however, there has
been interval reaccumulation of bilateral pleural effusions, small
on the right and moderate on the left, as above.

## 2021-07-26 IMAGING — DX DG CHEST 2V
2 series · 2 of 2 positions shown · non-contrast
Comparison: One month ago

CLINICAL DATA: Pleural effusion with shortness of breath

EXAM:
CHEST - 2 VIEW

[dg chest 2 view (1 of 2)]
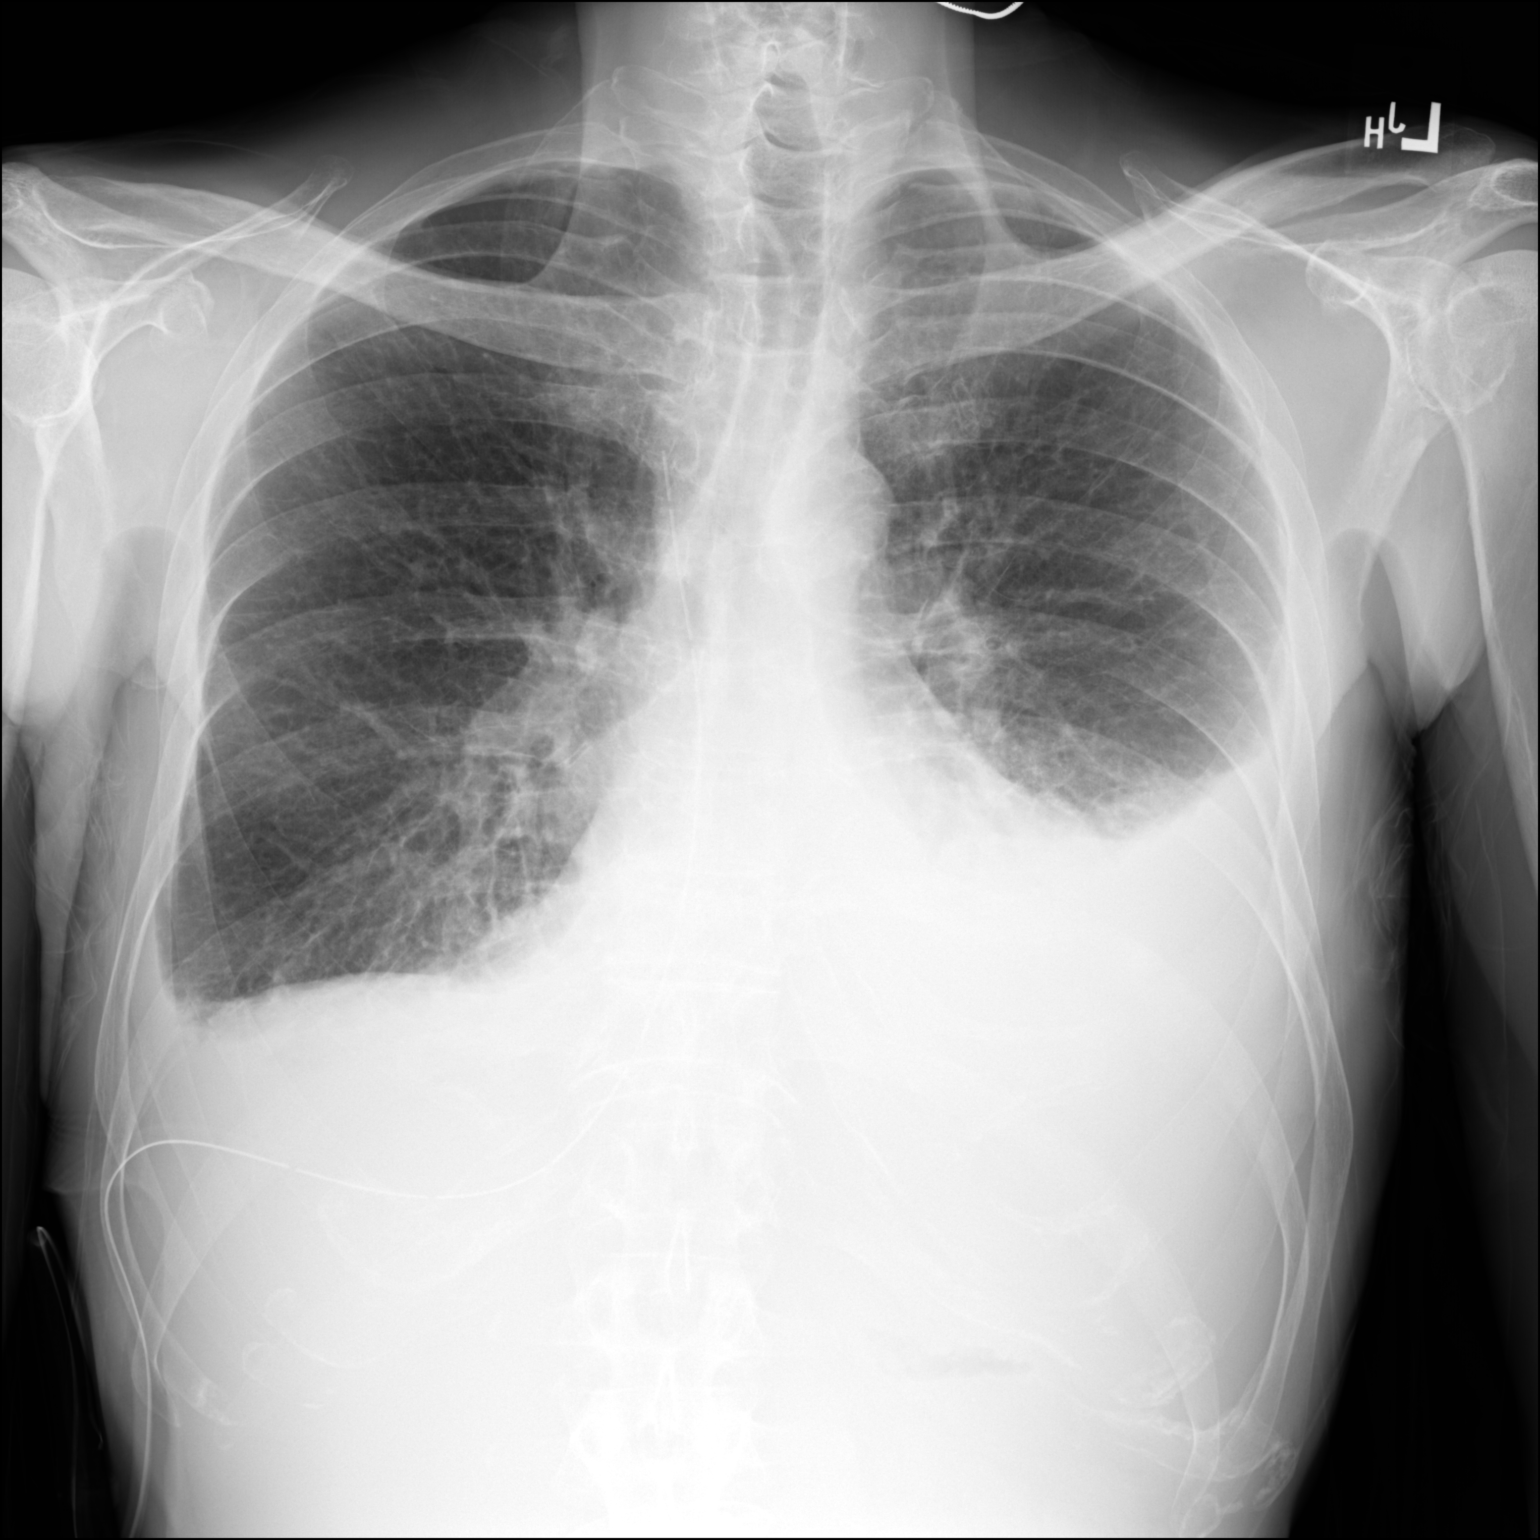

[dg chest 2 view (2 of 2)]
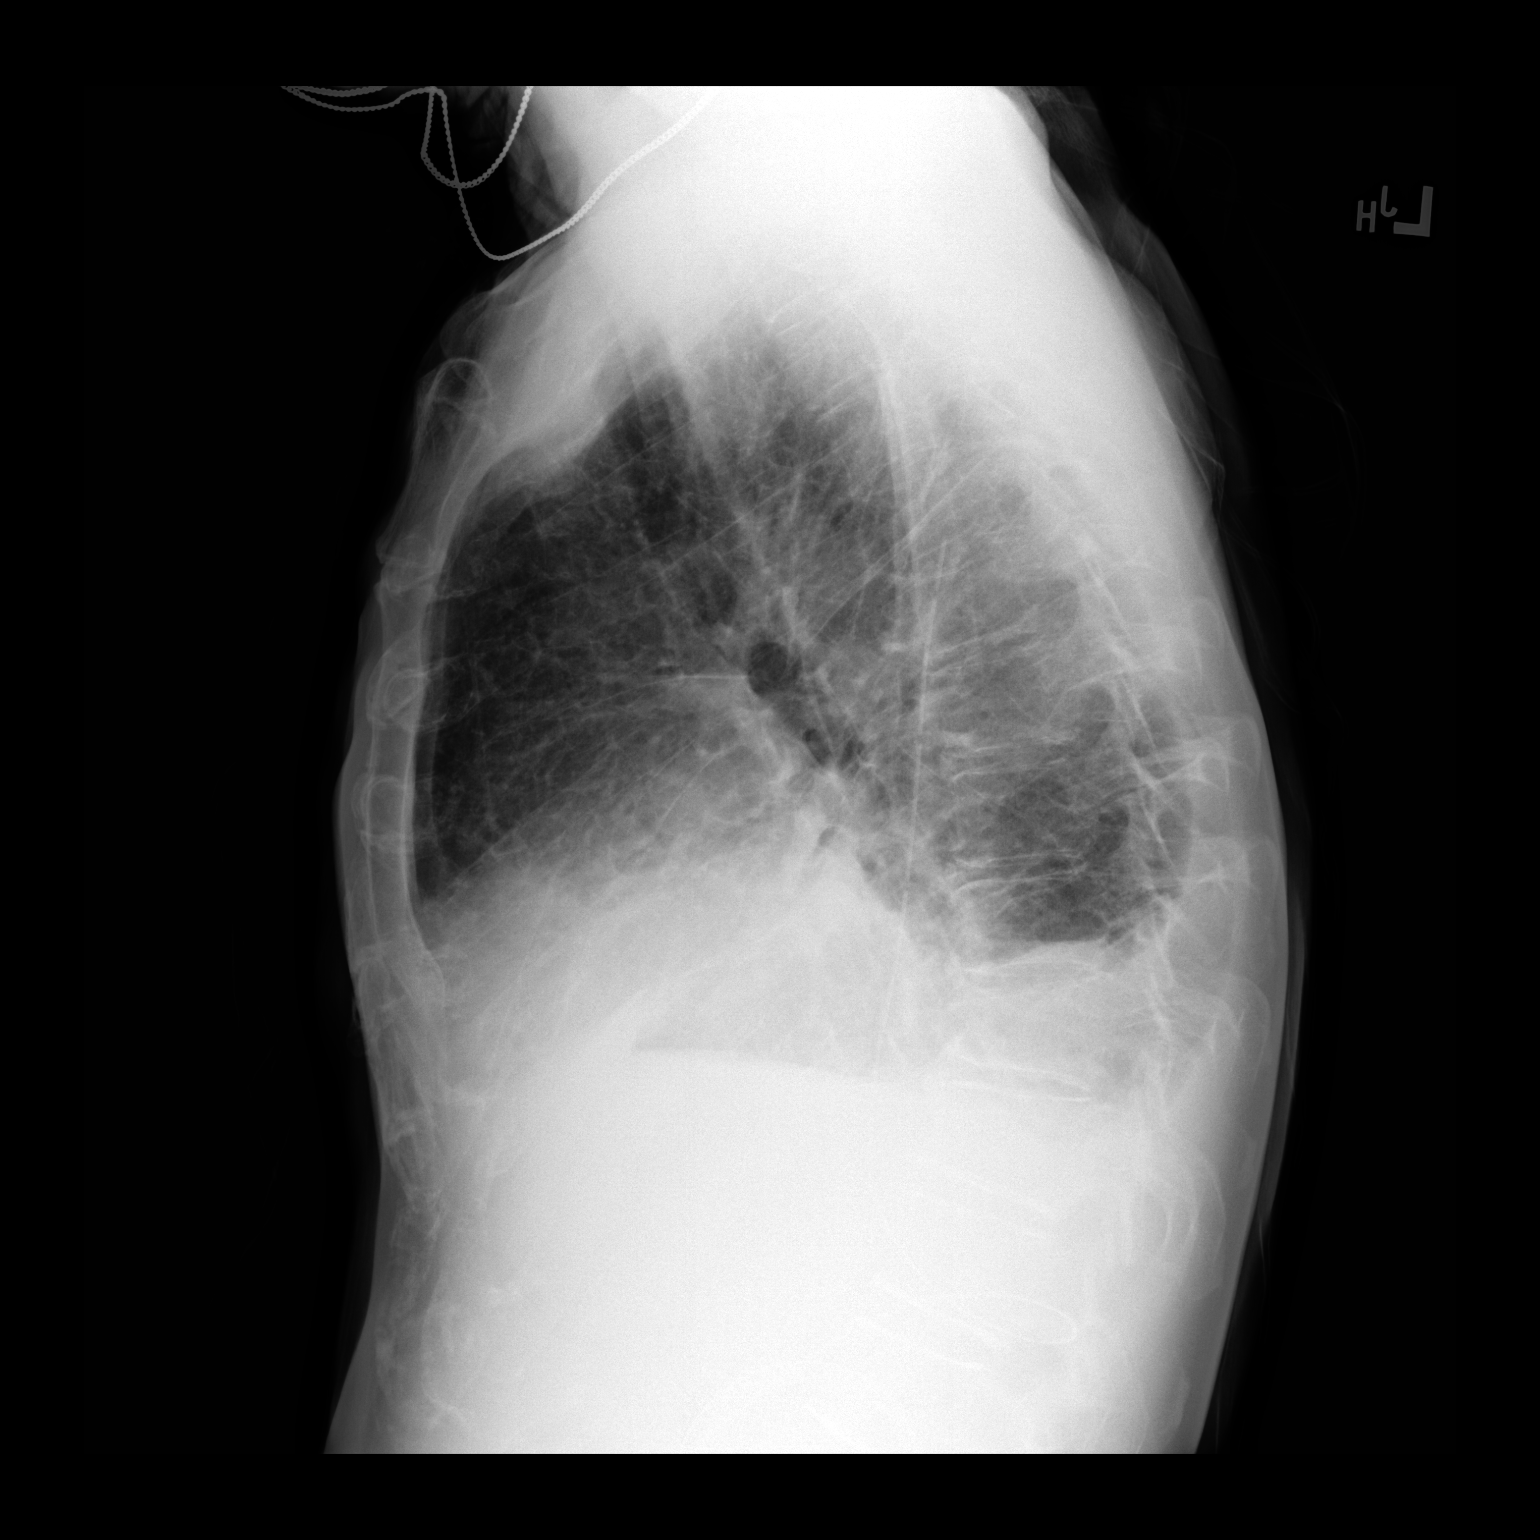

[2 of 2 positions shown; findings below may reference images not displayed]

FINDINGS: Moderate left and small right pleural effusion. Tunneled pleural
catheter on the right with tip in stable position. Mild bilateral
interstitial thickening is stable. Normal heart size where not
obscured. No visible pneumothorax.
IMPRESSION: Moderate left and small right pleural effusion that is unchanged
from 1 month ago.

## 2021-08-23 IMAGING — CR DG CHEST 2V
2 series · 2 of 2 positions shown · non-contrast
Comparison: Chest x-ray dated 08/30/2019

CLINICAL DATA: Bilateral pleural effusions. Right chest tube.

EXAM:
CHEST - 2 VIEW

[w chest pa]
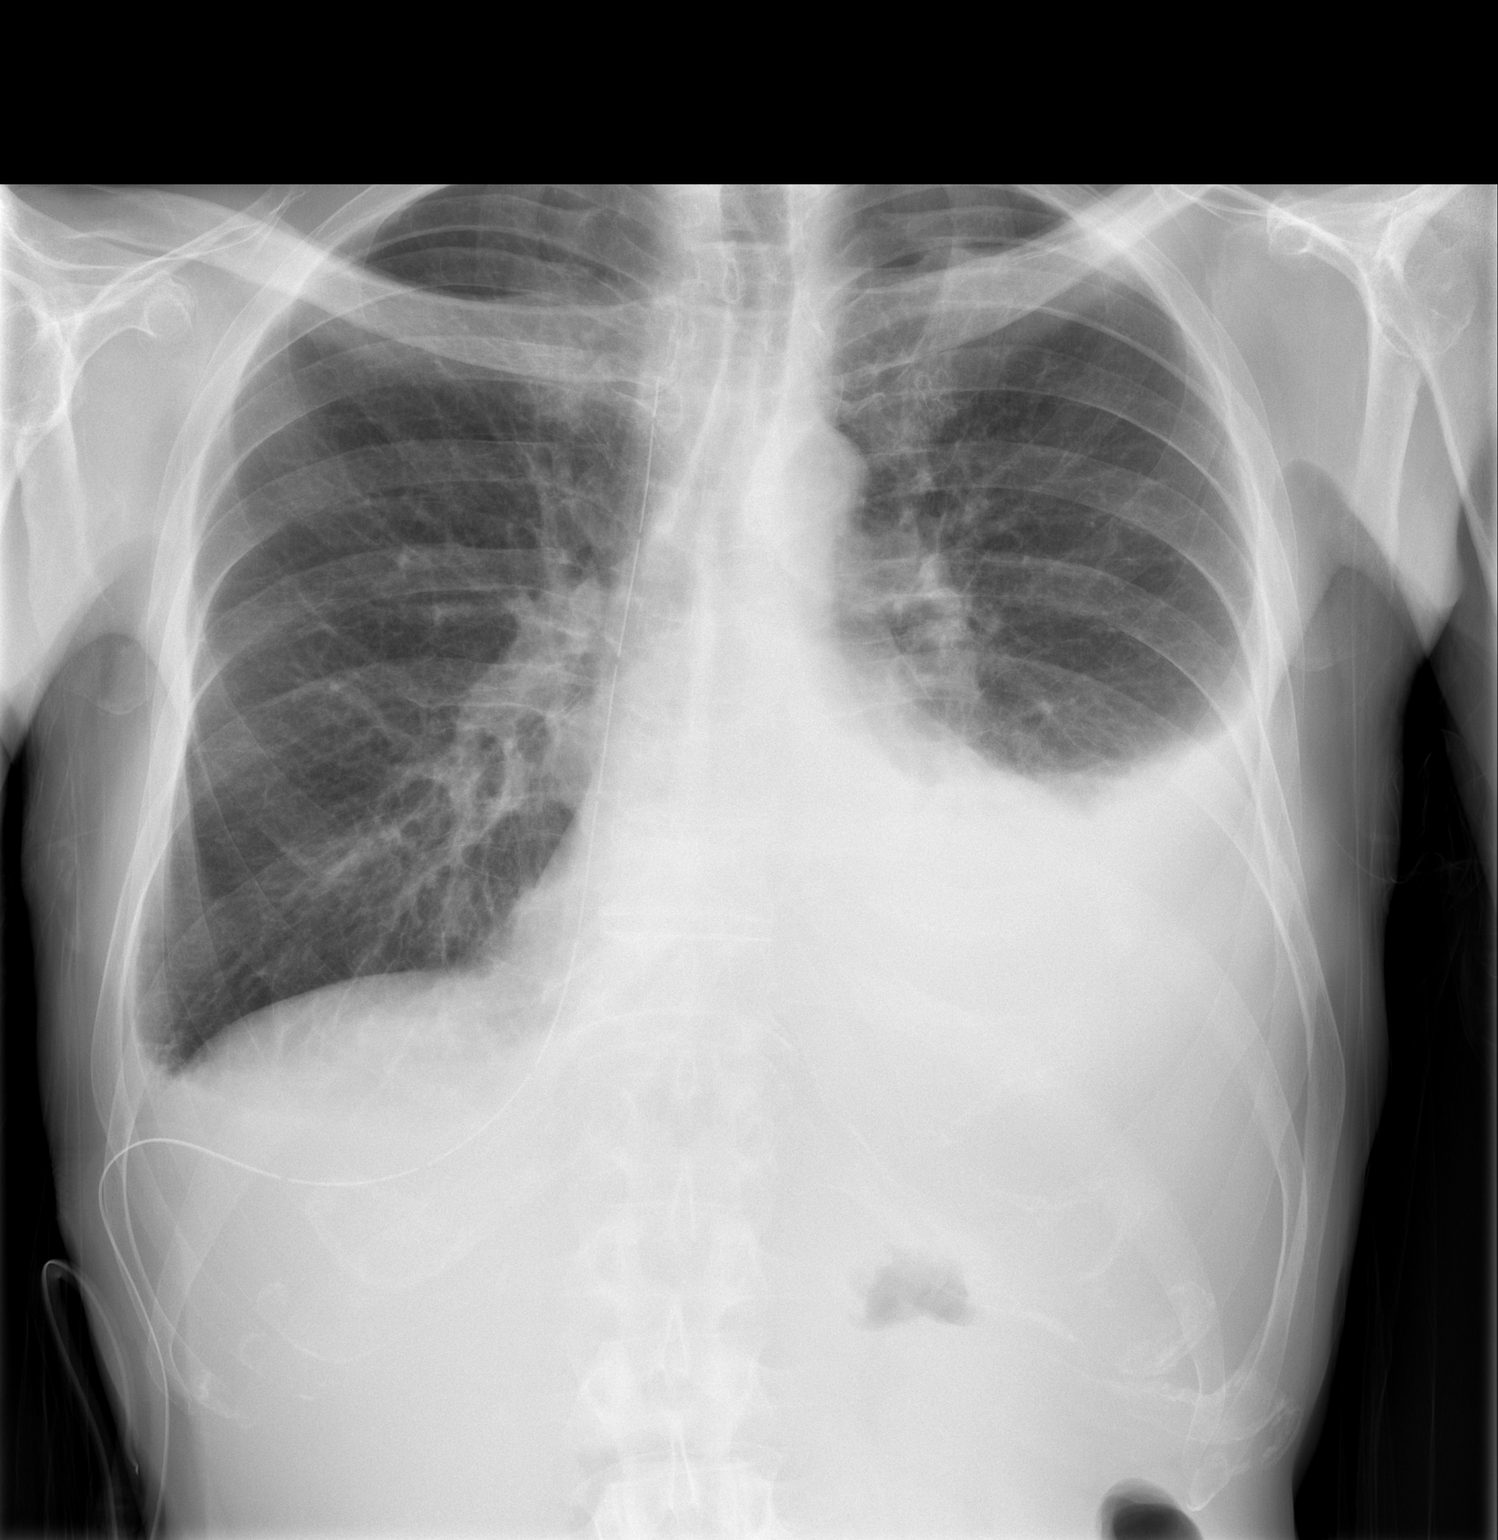

[w chest lat]
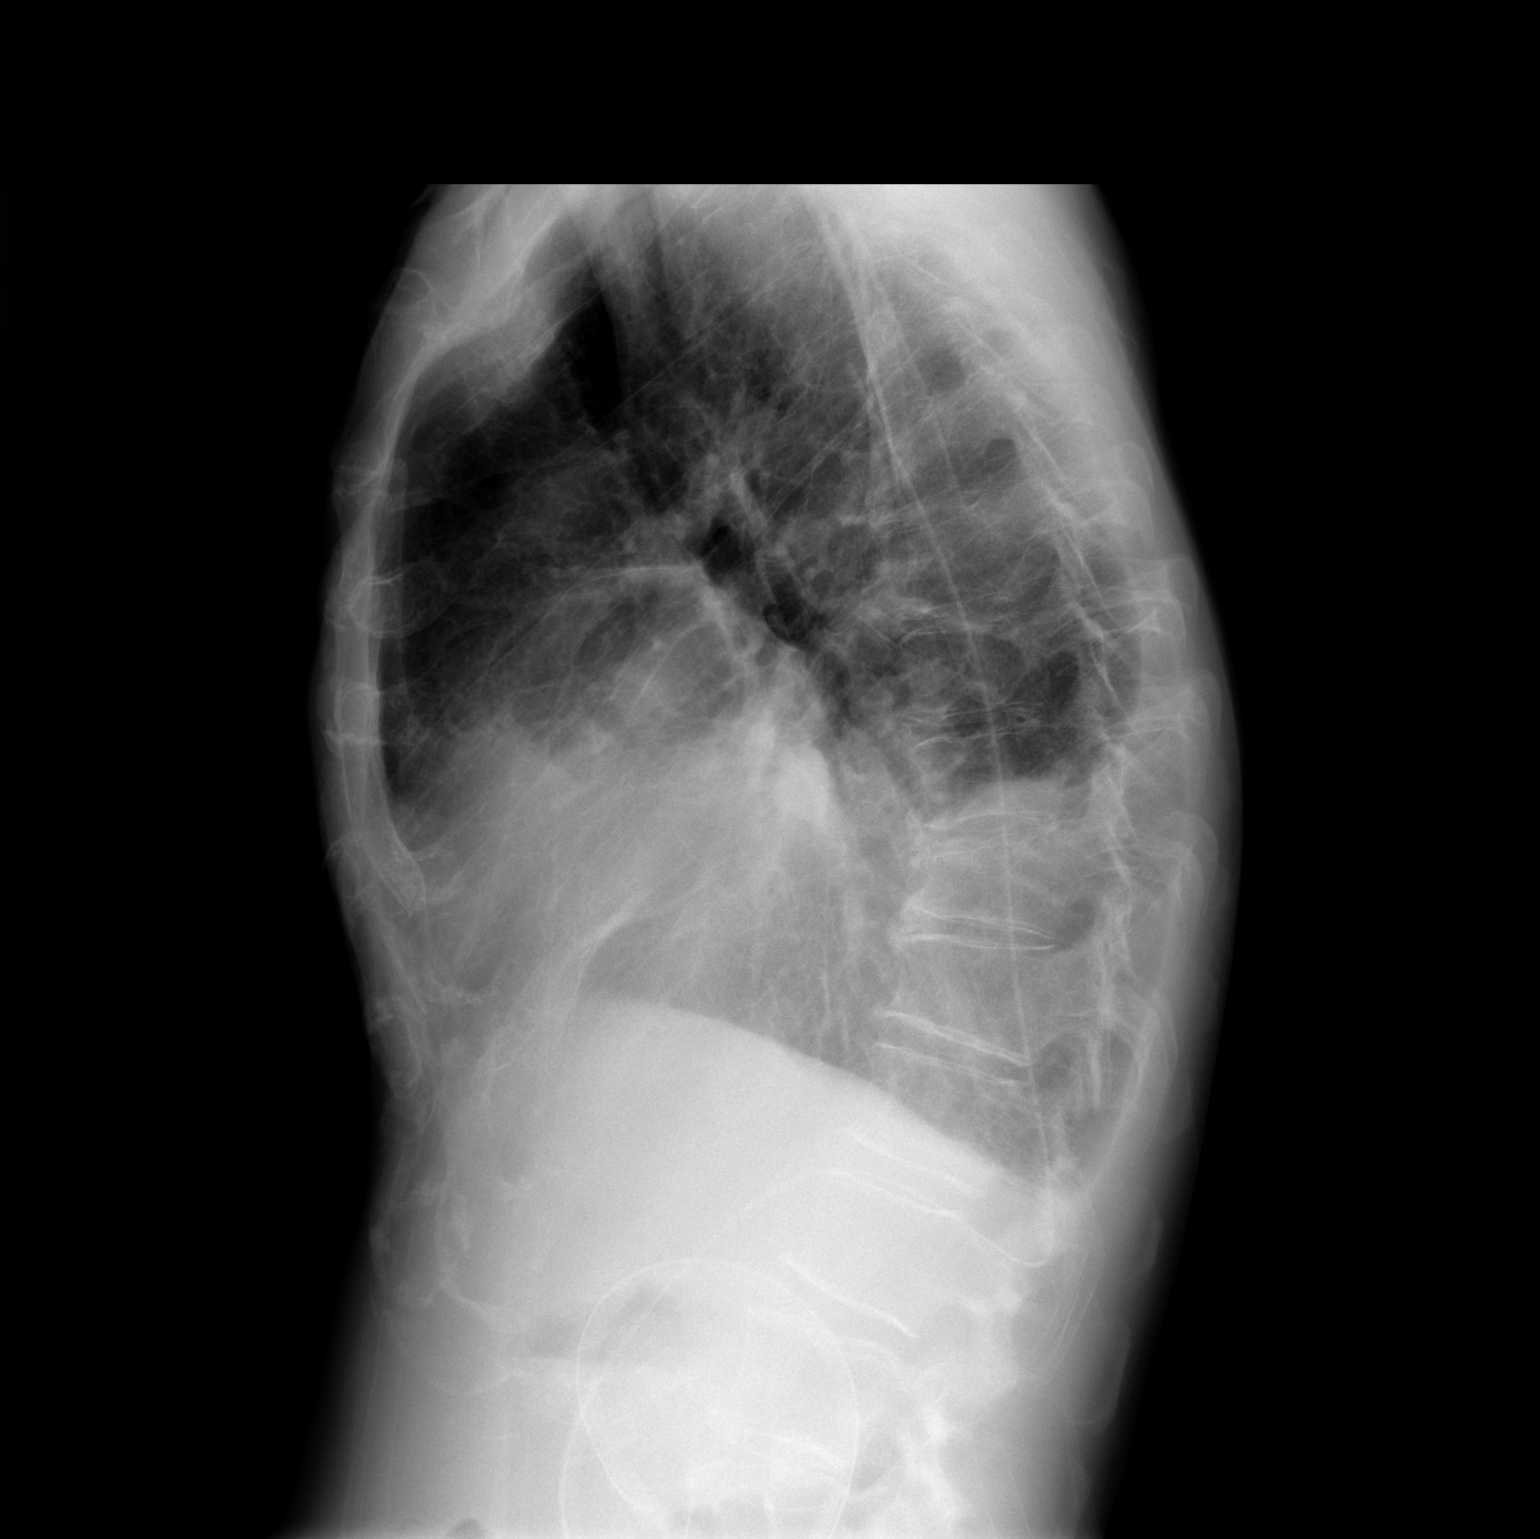

[2 of 2 positions shown; findings below may reference images not displayed]

FINDINGS: There has been a slight increase in the moderate left pleural
effusion. There has been a appreciable decrease in the small right
pleural effusion. Right chest tube is unchanged. No pneumothorax.
Heart size and vascularity are normal. No bone abnormality.
IMPRESSION: 1. Slight increase in the moderate left pleural effusion.
2. No pneumothorax.
3. Decreased small right pleural effusion.

## 2021-09-20 IMAGING — CR DG CHEST 2V
2 series · 2 of 2 positions shown · non-contrast
Comparison: September 27, 2019.

CLINICAL DATA: Pleural effusion.

EXAM:
CHEST - 2 VIEW

[w chest pa]
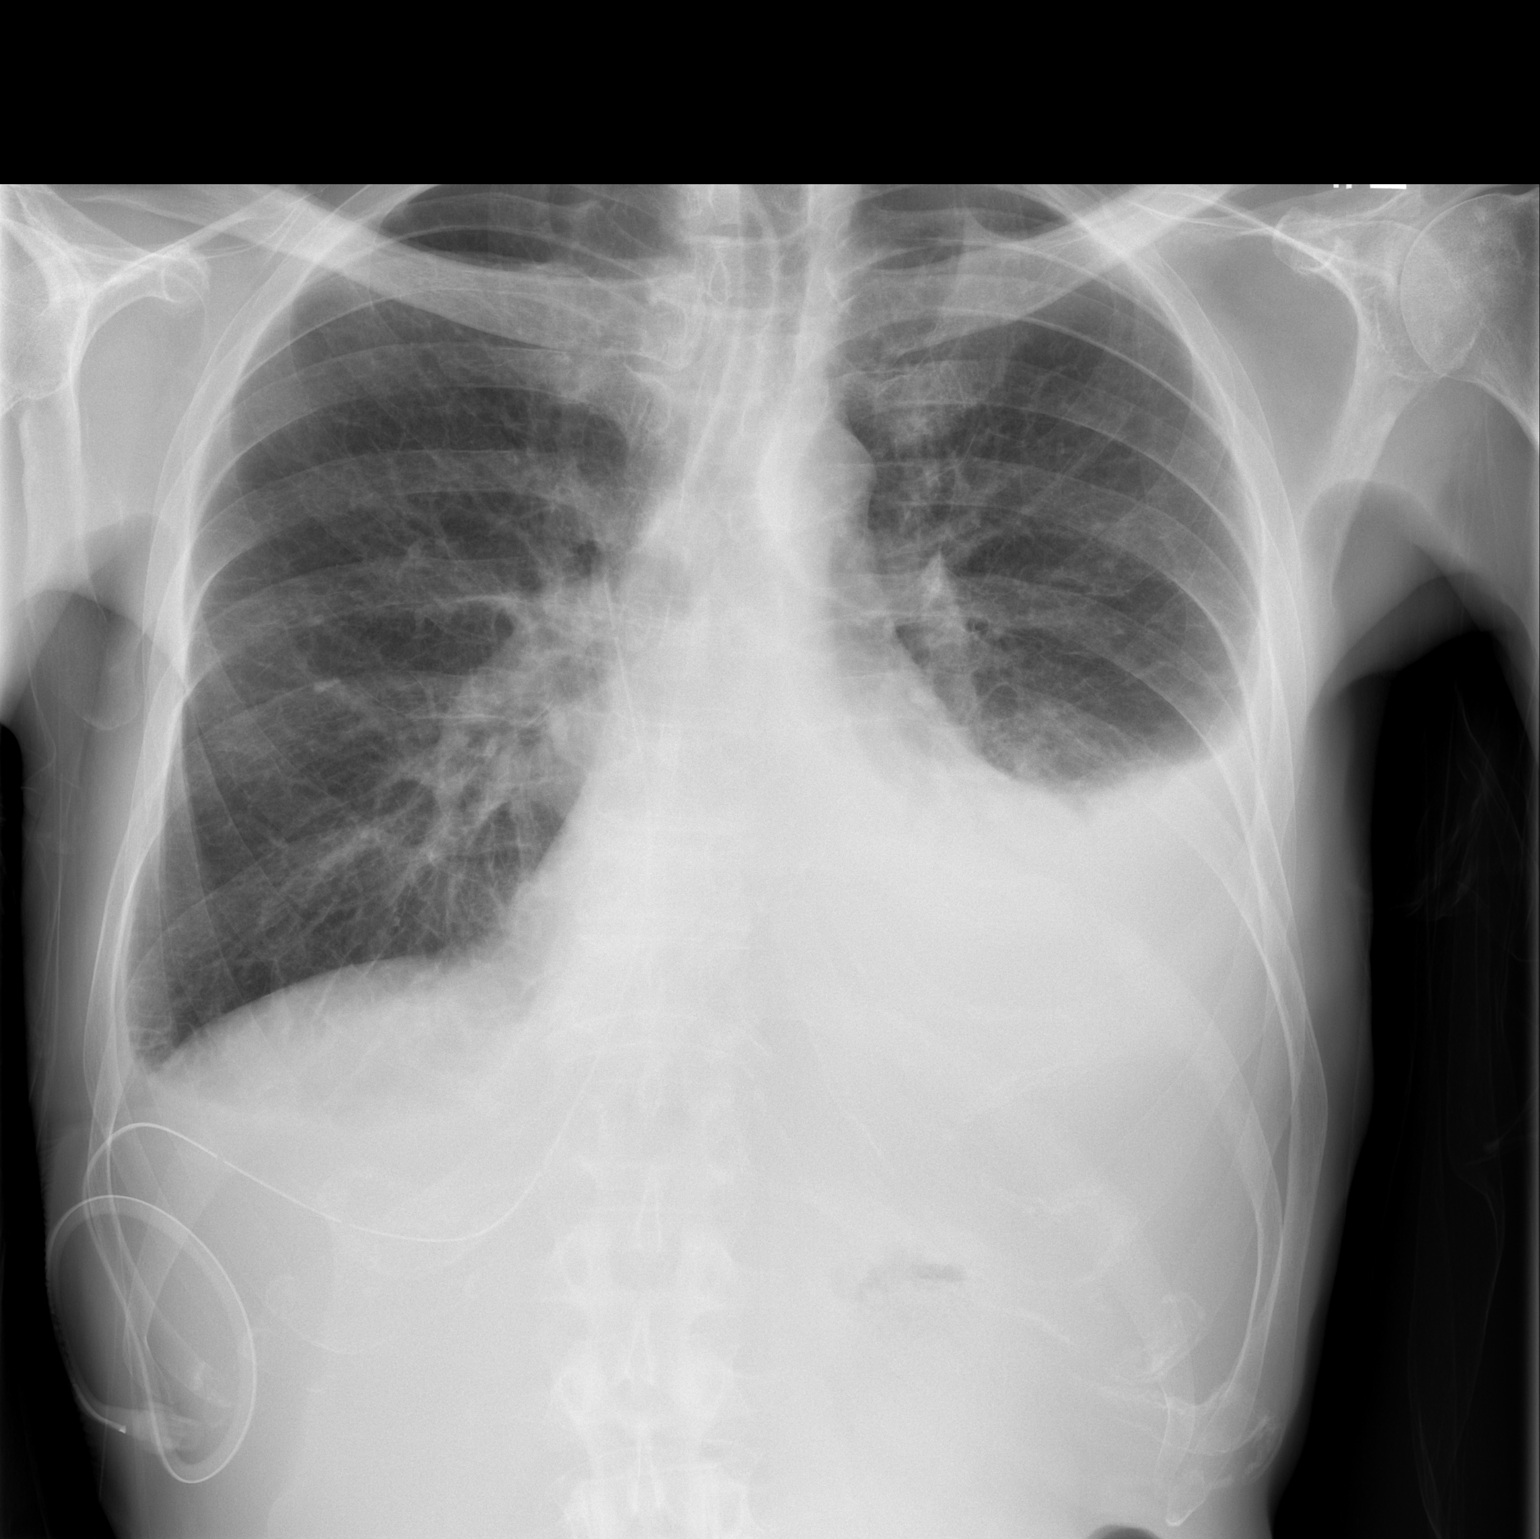

[w chest lat]
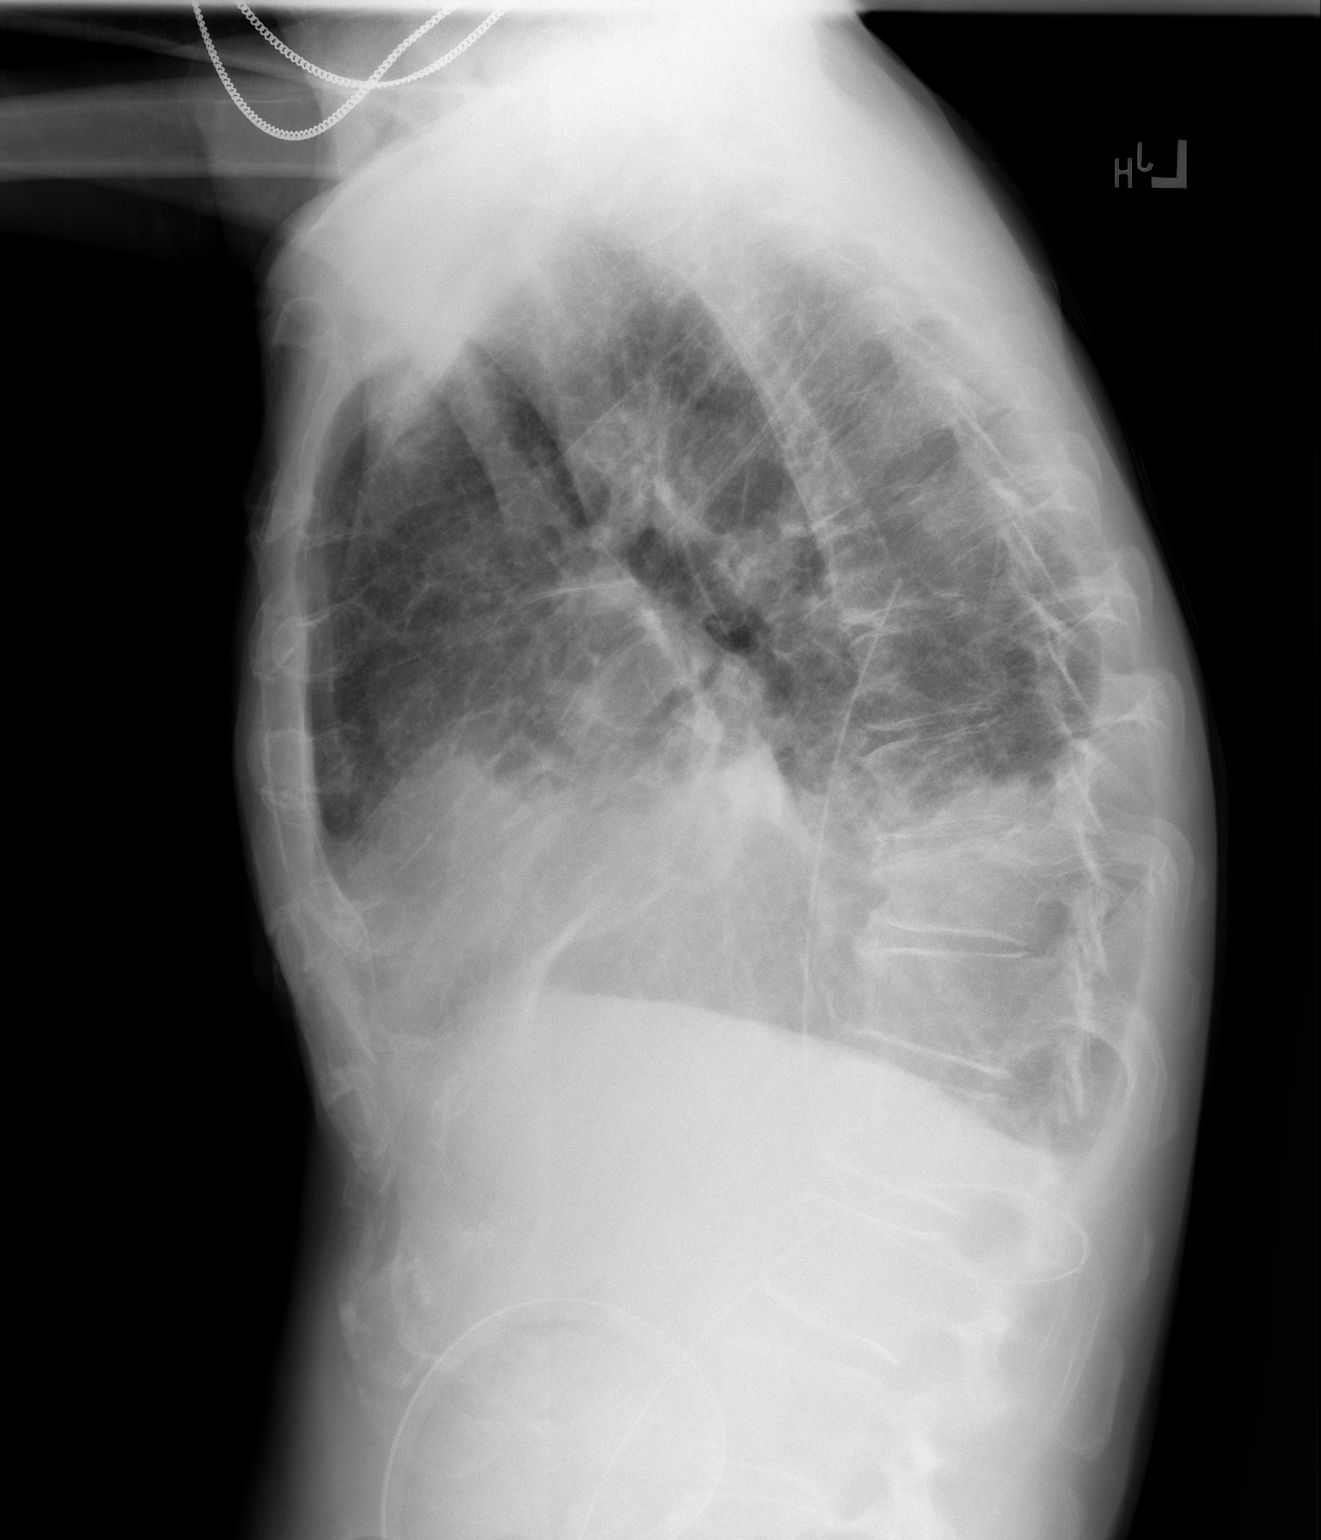

[2 of 2 positions shown; findings below may reference images not displayed]

FINDINGS: Stable cardiomediastinal silhouette. No pneumothorax is noted.
Stable position of right-sided pleural drainage catheter. Minimal
right pleural effusion is noted. Stable moderate size left pleural
effusion is noted with probable underlying atelectasis. Bony thorax
is unremarkable.
IMPRESSION: 1. Stable moderate size left pleural effusion with probable
underlying atelectasis. Stable position of right-sided pleural
drainage catheter. No pneumothorax is noted.
2. Stable minimal right pleural effusion.
